# Patient Record
Sex: Female | Born: 1980 | Race: Black or African American | Hispanic: No | Marital: Married | Smoking: Never smoker
Health system: Southern US, Community
[De-identification: ages and names within clinical notes are randomized; demographics above are authoritative.]

## PROBLEM LIST (undated history)

## (undated) DIAGNOSIS — Z9889 Other specified postprocedural states: Secondary | ICD-10-CM

## (undated) DIAGNOSIS — E039 Hypothyroidism, unspecified: Secondary | ICD-10-CM

## (undated) DIAGNOSIS — D649 Anemia, unspecified: Secondary | ICD-10-CM

## (undated) DIAGNOSIS — D219 Benign neoplasm of connective and other soft tissue, unspecified: Secondary | ICD-10-CM

## (undated) DIAGNOSIS — R51 Headache: Secondary | ICD-10-CM

## (undated) DIAGNOSIS — K429 Umbilical hernia without obstruction or gangrene: Secondary | ICD-10-CM

## (undated) DIAGNOSIS — D497 Neoplasm of unspecified behavior of endocrine glands and other parts of nervous system: Secondary | ICD-10-CM

## (undated) DIAGNOSIS — C801 Malignant (primary) neoplasm, unspecified: Secondary | ICD-10-CM

## (undated) DIAGNOSIS — R112 Nausea with vomiting, unspecified: Secondary | ICD-10-CM

## (undated) DIAGNOSIS — Q0700 Arnold-Chiari syndrome without spina bifida or hydrocephalus: Principal | ICD-10-CM

## (undated) HISTORY — DX: Malignant (primary) neoplasm, unspecified: C80.1

## (undated) HISTORY — PX: WISDOM TOOTH EXTRACTION: SHX21

## (undated) HISTORY — DX: Headache: R51

## (undated) HISTORY — PX: HERNIA REPAIR: SHX51

## (undated) HISTORY — DX: Benign neoplasm of connective and other soft tissue, unspecified: D21.9

## (undated) HISTORY — DX: Arnold-Chiari syndrome without spina bifida or hydrocephalus: Q07.00

---
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
## 2005-05-17 ENCOUNTER — Other Ambulatory Visit: Admission: RE | Admit: 2005-05-17 | Discharge: 2005-05-17 | Payer: Self-pay | Admitting: Obstetrics and Gynecology

## 2009-03-19 ENCOUNTER — Encounter (INDEPENDENT_AMBULATORY_CARE_PROVIDER_SITE_OTHER): Payer: Self-pay | Admitting: Obstetrics and Gynecology

## 2009-03-19 ENCOUNTER — Ambulatory Visit (HOSPITAL_COMMUNITY): Admission: RE | Admit: 2009-03-19 | Discharge: 2009-03-19 | Payer: Self-pay | Admitting: Obstetrics and Gynecology

## 2009-03-19 HISTORY — PX: DILATION AND CURETTAGE OF UTERUS: SHX78

## 2010-06-29 ENCOUNTER — Inpatient Hospital Stay (HOSPITAL_COMMUNITY)
Admission: AD | Admit: 2010-06-29 | Discharge: 2010-06-29 | Payer: Self-pay | Source: Home / Self Care | Attending: Obstetrics and Gynecology | Admitting: Obstetrics and Gynecology

## 2010-07-04 LAB — URINE MICROSCOPIC-ADD ON

## 2010-07-04 LAB — URINALYSIS, ROUTINE W REFLEX MICROSCOPIC
Bilirubin Urine: NEGATIVE
Ketones, ur: NEGATIVE mg/dL
Nitrite: NEGATIVE
Protein, ur: NEGATIVE mg/dL
Specific Gravity, Urine: 1.01 (ref 1.005–1.030)
Urine Glucose, Fasting: NEGATIVE mg/dL
Urobilinogen, UA: 0.2 mg/dL (ref 0.0–1.0)
pH: 6.5 (ref 5.0–8.0)

## 2010-07-04 LAB — WET PREP, GENITAL
Clue Cells Wet Prep HPF POC: NONE SEEN
Trich, Wet Prep: NONE SEEN
Yeast Wet Prep HPF POC: NONE SEEN

## 2010-07-04 LAB — URINE CULTURE
Colony Count: NO GROWTH
Culture  Setup Time: 201201111119
Culture: NO GROWTH

## 2010-07-04 LAB — GC/CHLAMYDIA PROBE AMP, GENITAL
Chlamydia, DNA Probe: NEGATIVE
GC Probe Amp, Genital: NEGATIVE

## 2010-09-22 LAB — CBC
HCT: 36.4 % (ref 36.0–46.0)
Hemoglobin: 12.2 g/dL (ref 12.0–15.0)
MCHC: 33.5 g/dL (ref 30.0–36.0)
MCV: 93.9 fL (ref 78.0–100.0)
Platelets: 298 10*3/uL (ref 150–400)
RBC: 3.88 MIL/uL (ref 3.87–5.11)
RDW: 15.5 % (ref 11.5–15.5)
WBC: 6.8 10*3/uL (ref 4.0–10.5)

## 2010-10-05 ENCOUNTER — Inpatient Hospital Stay (HOSPITAL_COMMUNITY)
Admission: AD | Admit: 2010-10-05 | Discharge: 2010-10-08 | DRG: 373 | Disposition: A | Discharge status: Home / Self Care | Payer: BC Managed Care – PPO | Source: Ambulatory Visit | Attending: Obstetrics and Gynecology | Admitting: Obstetrics and Gynecology

## 2010-10-05 DIAGNOSIS — D649 Anemia, unspecified: Secondary | ICD-10-CM | POA: Diagnosis present

## 2010-10-05 DIAGNOSIS — Z22330 Carrier of Group B streptococcus: Secondary | ICD-10-CM

## 2010-10-05 DIAGNOSIS — O99892 Other specified diseases and conditions complicating childbirth: Secondary | ICD-10-CM | POA: Diagnosis present

## 2010-10-05 DIAGNOSIS — O9902 Anemia complicating childbirth: Secondary | ICD-10-CM | POA: Diagnosis present

## 2010-10-05 LAB — CBC
HCT: 33.2 % — ABNORMAL LOW (ref 36.0–46.0)
Hemoglobin: 10.8 g/dL — ABNORMAL LOW (ref 12.0–15.0)
MCH: 32.4 pg (ref 26.0–34.0)
MCHC: 32.5 g/dL (ref 30.0–36.0)
MCV: 99.7 fL (ref 78.0–100.0)
Platelets: 269 10*3/uL (ref 150–400)
RBC: 3.33 MIL/uL — ABNORMAL LOW (ref 3.87–5.11)
RDW: 13.4 % (ref 11.5–15.5)
WBC: 12.5 10*3/uL — ABNORMAL HIGH (ref 4.0–10.5)

## 2010-10-06 LAB — RPR: RPR Ser Ql: NONREACTIVE

## 2010-10-07 LAB — CBC
HCT: 26.3 % — ABNORMAL LOW (ref 36.0–46.0)
Hemoglobin: 8.9 g/dL — ABNORMAL LOW (ref 12.0–15.0)
MCH: 33.8 pg (ref 26.0–34.0)
MCHC: 33.8 g/dL (ref 30.0–36.0)
MCV: 100 fL (ref 78.0–100.0)
Platelets: 184 10*3/uL (ref 150–400)
RBC: 2.63 MIL/uL — ABNORMAL LOW (ref 3.87–5.11)
RDW: 13.3 % (ref 11.5–15.5)
WBC: 11.3 10*3/uL — ABNORMAL HIGH (ref 4.0–10.5)

## 2010-10-27 NOTE — H&P (Signed)
  NAMETRESA, JOLLEY           ACCOUNT NO.:  [id number]  MEDICAL RECORD NO.:  [id number]           PATIENT TYPE:  LOCATION:  169                            FACILITY:  PHYSICIAN:  Janine Limbo, M.D.DATE OF BIRTH:  02-11-1981  DATE OF ADMISSION: DATE OF DISCHARGE:                             HISTORY & PHYSICAL   HISTORY OF PRESENT ILLNESS:  Ms. Schlee is a 30 year old female, gravida 2, para 0-0-1-0, who presents for induction of labor.  The patient has been followed at the [facility] of [facility].  The patient's pregnancy has been complicated by asthma, and anemia.  She has used an inhaler as needed.  She is currently taking iron.  She has done well.  OBSTETRICAL HISTORY:  The patient has had one first trimester miscarriage.  DRUG ALLERGIES:  NASONEX and SINGULAIR.  SOCIAL HISTORY:  The patient denies cigarette use, alcohol use, and recreational drug use.  PAST MEDICAL HISTORY:  The patient had her wisdom teeth removed in 1999. She had a polyp removed in October 2010.  She had a dilatation and evacuation for miscarriage in 2010.  The patient has a past history of asthma and anemia as mentioned above.  REVIEW OF SYSTEMS:  Normal pregnancy complaints.  FAMILY HISTORY:  Noncontributory.  PHYSICAL EXAMINATION:  VITAL SIGNS:  Weight is 208 pounds.  Height is 5 feet 3 inches. HEENT:  Within normal limits. CHEST:  Clear. HEART:  Regular rate and rhythm. BREASTS:  Without masses. ABDOMEN:  Gravid with fundal height of 38 cm. EXTREMITIES:  Show 2-3+ edema. NEUROLOGIC:  Grossly normal. PELVIC:  The cervix is 3 cm dilated, 75% effaced and -2 station.  LABORATORY VALUES:  Blood type is O+, antibody screen negative, sickle cell negative, VDRL nonreactive, rubella immune, HBSAG negative, HIV is nonreactive.  First trimester screen is within normal limits.  Third trimester beta strep is  positive.  ASSESSMENT: 1. A 39-week and 4-day gestation. 2. Desire for induction of labor. 3. Asthma. 4. Positive beta strep test.  PLAN:  The patient will be admitted for induction of labor.  She understands that she may be at increased risk for cesarean delivery as a result of her induction.  She accepts the above risks and has requested to proceed as outlined above.  The patient will receive prophylactic antibiotics because of her positive beta strep culture.     Janine Limbo, M.D.     AVS/MEDQ  D:  10/05/2010  T:  10/05/2010  Job:  [id number]  Electronically Signed by Kirkland Hun M.D. on 10/27/2010 03:19:41 AM

## 2010-10-27 NOTE — Op Note (Signed)
Morgan Fleming, BIGLER           ACCOUNT NO.:  [id number]  MEDICAL RECORD NO.:  [id number]           PATIENT TYPE:  I  LOCATION:  9115                          FACILITY:  [facility]  PHYSICIAN:  Janine Limbo, M.D.DATE OF BIRTH:  1980/07/28  DATE OF PROCEDURE:  10/06/2010 DATE OF DISCHARGE:                              OPERATIVE REPORT   PREOPERATIVE DIAGNOSES: 1. 39-week and 4-day gestation. 2. Second stage fetal heart rate decelerations.  POSTOPERATIVE DIAGNOSES: 1. 39-week and 4-day gestation. 2. Second stage fetal heart rate decelerations. 3. Short umbilical cord. 4. Second-degree midline laceration.  PROCEDURES: 1. Vacuum extraction vaginal delivery. 2. Repair of second-degree midline laceration.  OBSTETRICIAN:  Janine Limbo, MD  FIRST ASSISTANT:  None.  ANESTHETIC:  Epidural.  DISPOSITION:  Morgan Fleming is a 30 year old female, gravida 2, para 0-0-1- 0, who was admitted on October 05, 2010, for induction of labor.  The patient has been followed at the [facility].  The patient has a history of asthma, a history of anemia, and a history of an elevated body mass index.  The patient was started on Pitocin and was noted to have decelerations throughout the night.  Her membranes were ruptured at approximately 9 o'clock a.m. on October 06, 2010.  An amnioinfusion was started at approximately 11:45 a.m. because of some fetal heart rate decelerations.  The patient was noted to be completely dilated and at a +3 station at approximately 2:40 p.m.  The patient was allowed to push, but again was noted to have decelerations of the fetal heart rate tracing with slow return to the baseline.  We discussed our management options which included observation only, continued pushing, operative vaginal delivery, and cesarean delivery.  The risks and benefits of each of those options were outlined.  The patient  elected to proceed with operative vaginal delivery.  We then reviewed forceps vaginal delivery and vacuum extraction vaginal delivery.  The risk and benefits of those options were outlined.  The patient, her husband, and her sister-in-law, and her mother all agreed that vacuum extraction vaginal delivery seemed to be the best option.  The specific risks of vacuum extraction were reviewed including caput formation, hematoma formation, the rare risk of intracranial bleeding, and the risk that the vacuum extraction would be unsuccessful and that we would still need to proceed with cesarean delivery.  The patient and her family members were ready to proceed.  FINDINGS:  An 8-pound-2-ounce female infant Morgan Fleming) was delivered from an occiput anterior position.  The Apgar scores were 8 at 1 minute and 9 at 5 minutes.  The arterial cord blood pH was 7.40.  The venous cord blood pH was 7.39.  There was a second-degree midline laceration present.  There were no upper vaginal or cervical lacerations.  The placenta appeared normal except for the short umbilical cord.  The placenta was sent to Pathology.  PROCEDURE:  The patient was placed in a lithotomy position in the Labor and Delivery Suite.  The perineum was prepped and then draped.  The cervix was completely dilated and 100% effaced.  The fetal head  could be seen at the introitus without spreading the labia.  A Foley catheter had just recently been removed.  The estimated fetal weight was 7.5-8 pounds.  The patient was noted to have an adequate pelvis.  Kiwi vacuum extractor was applied and with one push the fetal head was delivered. The mouth and nose were suctioned.  The remainder of the infant was then delivered.  The cord was clamped and cut by the family members and the infant was handed to the awaiting pediatric team under the direction of Dr. Mikle Bosworth who was present.  Routine cord blood studies were obtained including arterial and  venous pH values.  The placenta was then removed. The above vagina and perineum were carefully inspected.  The laceration was closed using interrupted sutures of 2-0 Vicryl followed by a running suture of 2-0 Vicryl for the vaginal mucosa and the perineum.  The closure was noted to be very good.  Hemostasis was adequate.  The estimated blood loss was 300 mL.  The patient was then returned to the supine position.  She was allowed to remain in the room with the infant for bonding.     Janine Limbo, M.D.     AVS/MEDQ  D:  10/06/2010  T:  10/07/2010  Job:  [id number]  Electronically Signed by Kirkland Hun M.D. on 10/27/2010 03:21:35 AM

## 2011-03-13 ENCOUNTER — Encounter (INDEPENDENT_AMBULATORY_CARE_PROVIDER_SITE_OTHER): Payer: Self-pay

## 2011-03-14 ENCOUNTER — Ambulatory Visit (INDEPENDENT_AMBULATORY_CARE_PROVIDER_SITE_OTHER): Payer: BC Managed Care – PPO | Admitting: General Surgery

## 2011-03-14 ENCOUNTER — Encounter (INDEPENDENT_AMBULATORY_CARE_PROVIDER_SITE_OTHER): Payer: Self-pay | Admitting: General Surgery

## 2011-03-14 VITALS — BP 146/88 | HR 68 | Temp 96.7°F | Resp 16 | Ht 63.0 in | Wt 195.6 lb

## 2011-03-14 DIAGNOSIS — K429 Umbilical hernia without obstruction or gangrene: Secondary | ICD-10-CM | POA: Insufficient documentation

## 2011-03-14 NOTE — Progress Notes (Signed)
Chief Complaint  Patient presents with  . Other    Eval umbilical hernia    HPI Morgan Fleming is a 30 y.o. female.     HPI  The patient comes in with a symptomatic umbilical hernia that she's had since her pregnancy which she delivered approximately 5 months ago. She has a happy healthy baby boy. Since that time the umbilical hernia is larger and is more symptomatic with discomfort and protrusion  Past Medical History  Diagnosis Date  . Asthma     Past Surgical History  Procedure Date  . Dilation and curettage, diagnostic / therapeutic 2010  . Skin tag removal 1996    right ear    History reviewed. No pertinent family history.  Social History History  Substance Use Topics  . Smoking status: Never Smoker   . Smokeless tobacco: Never Used  . Alcohol Use: No    Allergies  Allergen Reactions  . Nasonex Swelling    Only lips and nose.  . Singulair Nausea And Vomiting    Current Outpatient Prescriptions  Medication Sig Dispense Refill  . Prenatal Multivit-Min-Fe-FA (PRENATAL PO) Take by mouth 2 (two) times daily.        . ALBUTEROL IN Inhale into the lungs as needed.       . Fluticasone-Salmeterol (ADVAIR HFA IN) Inhale into the lungs daily.       . Norethindrone (ORTHO MICRONOR PO) Take by mouth.          Review of Systems Review of Systems  Constitutional: Negative.   HENT: Negative.   Eyes: Negative.   Respiratory: Negative.   Cardiovascular: Negative.   Gastrointestinal: Positive for abdominal pain (periumbilical).  Genitourinary: Negative.   Musculoskeletal: Negative.   Skin: Negative.   Neurological: Negative.   Hematological: Negative.   Psychiatric/Behavioral: Negative.     Blood pressure 146/88, pulse 68, temperature 96.7 F (35.9 C), temperature source Temporal, resp. rate 16, height 5\' 3"  (1.6 m), weight 195 lb 9.6 oz (88.724 kg).  Physical Exam Physical Exam  Constitutional: She is oriented to person, place, and time. She appears  well-developed and well-nourished.  HENT:  Head: Normocephalic and atraumatic.  Eyes: EOM are normal. Pupils are equal, round, and reactive to light.  Neck: Normal range of motion. Neck supple.  Cardiovascular: Normal rate, regular rhythm, normal heart sounds and intact distal pulses.   Pulmonary/Chest: Effort normal and breath sounds normal.  Abdominal: Soft. Normal appearance and bowel sounds are normal. There is tenderness (periumbilical). A hernia (umbilical + diastasis recti) is present.    Musculoskeletal: Normal range of motion.  Neurological: She is alert and oriented to person, place, and time.  Skin: Skin is warm and dry.  Psychiatric: She has a normal mood and affect. Her behavior is normal. Judgment and thought content normal.    Data Reviewed I reviewed the notes from Dr. Debria Garret office and there are no significant studies to review  Assessment    1.  Umbilical hernia non-incarcerated; 2.  Diastasis recti; 3.  Mild hypertension    Plan    Umbilical hernia repair discussed in detail with patient including risks and benefits. diastasi recti also discussed in terms of not being altered by planned operation       Morgan Fleming,Malique Driskill O 03/14/2011, 9:26 AM

## 2011-05-23 ENCOUNTER — Encounter (HOSPITAL_BASED_OUTPATIENT_CLINIC_OR_DEPARTMENT_OTHER): Payer: Self-pay | Admitting: *Deleted

## 2011-05-28 NOTE — H&P (Signed)
HISTORY AND PHYSICAL EXAMINATION     Author Note Status Last Update User Last Update Date/Time    Jetty Duhamel, MD Signed Jetty Duhamel, MD 03/14/2011  9:45 AM       Chief Complaint: ABDOMINAL PAIN AND UMBILICAL HERNIA Patient presents with  An umbilical hernia   HPI Morgan Fleming is a 30 y.Fleming. Female. The patient comes in with a symptomatic umbilical hernia that she's had since her pregnancy which she delivered approximately 5 months ago. She has a happy healthy baby boy. Since that time the umbilical hernia is larger and is more symptomatic with discomfort and protrusion    Past Medical History   Diagnosis  Date   .  Asthma           Past Surgical History   Procedure  Date   .  Dilation and curettage, diagnostic / therapeutic  2010   .  Skin tag removal  1996       right ear    History reviewed. No pertinent family history.  Social History History   Substance Use Topics   .  Smoking status:  Never Smoker    .  Smokeless tobacco:  Never Used   .  Alcohol Use:  No      Allergies   Allergen  Reactions   .  Nasonex  Swelling       Only lips and nose.   .  Singulair  Nausea And Vomiting      Current Outpatient Prescriptions   Medication  Sig  Dispense  Refill   .  Prenatal Multivit-Min-Fe-FA (PRENATAL PO)  Take by mouth 2 (two) times daily.           .  ALBUTEROL IN  Inhale into the lungs as needed.          .  Fluticasone-Salmeterol (ADVAIR HFA IN)  Inhale into the lungs daily.          .  Norethindrone (ORTHO MICRONOR PO)  Take by mouth.            Review of Systems  Constitutional: Negative.   HENT: Negative.   Eyes: Negative.   Respiratory: Negative.   Cardiovascular: Negative.   Gastrointestinal: Positive for abdominal pain (periumbilical).  Genitourinary: Negative.   Musculoskeletal: Negative.   Skin: Negative.   Neurological: Negative.   Hematological: Negative.   Psychiatric/Behavioral: Negative.    Blood pressure 146/88, pulse 68,  temperature 96.7 F (35.9 C), temperature source Temporal, resp. rate 16, height 5\' 3"  (1.6 m), weight 195 lb 9.6 oz (88.724 kg).  Physical Exam   Constitutional: She is oriented to person, place, and time. She appears well-developed and well-nourished.  HENT:   Head: Normocephalic and atraumatic.  Eyes: EOM are normal. Pupils are equal, round, and reactive to light.  Neck: Normal range of motion. Neck supple.  Cardiovascular: Normal rate, regular rhythm, normal heart sounds and intact distal pulses.   Pulmonary/Chest: Effort normal and breath sounds normal.  Abdominal: Soft. Normal appearance and bowel sounds are normal. There is tenderness (periumbilical). A hernia (umbilical + diastasis recti) is present. The unbilicalhernia is just below the umbilicus with a large diastasis recti above the umbilicus Musculoskeletal: Normal range of motion.  Neurological: She is alert and oriented to person, place, and time.  Skin: Skin is warm and dry.  Psychiatric: She has a normal mood and affect. Her behavior is normal. Judgment and thought content normal.      Data  Reviewed I reviewed the notes from Dr. Debria Garret office and there are no significant studies to review   Assessment    1.  Umbilical hernia non-incarcerated; 2.  Diastasis recti; 3.  Mild hypertension  Plan    Umbilical hernia repair discussed in detail with patient including risks and benefits. diastasi recti also discussed in terms of not being altered by planned operation    Morgan Fleming,Morgan Fleming 03/14/2011, 9:26 AM  This H&P was drafted from the original progress note from the office visit originally.

## 2011-05-29 ENCOUNTER — Ambulatory Visit (HOSPITAL_BASED_OUTPATIENT_CLINIC_OR_DEPARTMENT_OTHER)
Admission: RE | Admit: 2011-05-29 | Discharge: 2011-05-29 | Disposition: A | Discharge status: Home / Self Care | Payer: BC Managed Care – PPO | Source: Ambulatory Visit | Attending: General Surgery | Admitting: General Surgery

## 2011-05-29 ENCOUNTER — Encounter (HOSPITAL_BASED_OUTPATIENT_CLINIC_OR_DEPARTMENT_OTHER): Payer: Self-pay | Admitting: Anesthesiology

## 2011-05-29 ENCOUNTER — Encounter (HOSPITAL_BASED_OUTPATIENT_CLINIC_OR_DEPARTMENT_OTHER): Payer: Self-pay | Admitting: *Deleted

## 2011-05-29 ENCOUNTER — Encounter (HOSPITAL_BASED_OUTPATIENT_CLINIC_OR_DEPARTMENT_OTHER)
Admission: RE | Disposition: A | Discharge status: Home / Self Care | Payer: Self-pay | Source: Ambulatory Visit | Attending: General Surgery

## 2011-05-29 ENCOUNTER — Ambulatory Visit (HOSPITAL_BASED_OUTPATIENT_CLINIC_OR_DEPARTMENT_OTHER): Payer: BC Managed Care – PPO | Admitting: Anesthesiology

## 2011-05-29 DIAGNOSIS — I10 Essential (primary) hypertension: Secondary | ICD-10-CM | POA: Insufficient documentation

## 2011-05-29 DIAGNOSIS — K429 Umbilical hernia without obstruction or gangrene: Secondary | ICD-10-CM

## 2011-05-29 DIAGNOSIS — Z01812 Encounter for preprocedural laboratory examination: Secondary | ICD-10-CM | POA: Insufficient documentation

## 2011-05-29 DIAGNOSIS — J45909 Unspecified asthma, uncomplicated: Secondary | ICD-10-CM | POA: Insufficient documentation

## 2011-05-29 HISTORY — DX: Anemia, unspecified: D64.9

## 2011-05-29 HISTORY — DX: Umbilical hernia without obstruction or gangrene: K42.9

## 2011-05-29 HISTORY — PX: UMBILICAL HERNIA REPAIR: SHX196

## 2011-05-29 LAB — POCT HEMOGLOBIN-HEMACUE: Hemoglobin: 10.8 g/dL — ABNORMAL LOW (ref 12.0–15.0)

## 2011-05-29 SURGERY — REPAIR, HERNIA, UMBILICAL, ADULT
Anesthesia: General | Site: Abdomen | Wound class: Clean

## 2011-05-29 MED ORDER — CEFAZOLIN SODIUM 1-5 GM-% IV SOLN: 1.0000 g | INTRAVENOUS | Status: DC

## 2011-05-29 MED ORDER — BUPIVACAINE HCL (PF) 0.5 % IJ SOLN
INTRAMUSCULAR | Status: DC | PRN
  Administered 2011-05-29: 10 mL

## 2011-05-29 MED ORDER — SODIUM CHLORIDE 0.9 % IR SOLN
Status: DC | PRN
  Administered 2011-05-29: 08:00:00

## 2011-05-29 MED ORDER — HYDROCODONE-ACETAMINOPHEN 5-500 MG PO TABS: 1.0000 | ORAL_TABLET | Freq: Four times a day (QID) | ORAL | Status: AC | PRN

## 2011-05-29 MED ORDER — ONDANSETRON HCL 4 MG/2ML IJ SOLN
INTRAMUSCULAR | Status: DC | PRN
  Administered 2011-05-29: 4 mg via INTRAVENOUS

## 2011-05-29 MED ORDER — ACETAMINOPHEN 10 MG/ML IV SOLN
1000.0000 mg | Freq: Once | INTRAVENOUS | Status: AC
  Administered 2011-05-29: 1000 mg via INTRAVENOUS

## 2011-05-29 MED ORDER — FENTANYL CITRATE 0.05 MG/ML IJ SOLN
25.0000 ug | INTRAMUSCULAR | Status: DC | PRN
  Administered 2011-05-29 (×2): 25 ug via INTRAVENOUS

## 2011-05-29 MED ORDER — OXYCODONE HCL 5 MG PO TABS
5.0000 mg | ORAL_TABLET | Freq: Once | ORAL | Status: AC
  Administered 2011-05-29: 5 mg via ORAL

## 2011-05-29 MED ORDER — LIDOCAINE HCL (CARDIAC) 20 MG/ML IV SOLN
INTRAVENOUS | Status: DC | PRN
  Administered 2011-05-29: 50 mg via INTRAVENOUS

## 2011-05-29 MED ORDER — METOCLOPRAMIDE HCL 5 MG/ML IJ SOLN
INTRAMUSCULAR | Status: DC | PRN
  Administered 2011-05-29: 10 mg via INTRAVENOUS

## 2011-05-29 MED ORDER — MORPHINE SULFATE 2 MG/ML IJ SOLN: 0.0500 mg/kg | INTRAMUSCULAR | Status: DC | PRN

## 2011-05-29 MED ORDER — METOCLOPRAMIDE HCL 5 MG/ML IJ SOLN: 10.0000 mg | Freq: Once | INTRAMUSCULAR | Status: DC | PRN

## 2011-05-29 MED ORDER — PROPOFOL 10 MG/ML IV EMUL
INTRAVENOUS | Status: DC | PRN
  Administered 2011-05-29: 50 mg via INTRAVENOUS
  Administered 2011-05-29: 200 mg via INTRAVENOUS

## 2011-05-29 MED ORDER — MIDAZOLAM HCL 5 MG/5ML IJ SOLN
INTRAMUSCULAR | Status: DC | PRN
  Administered 2011-05-29: 2 mg via INTRAVENOUS

## 2011-05-29 MED ORDER — DEXAMETHASONE SODIUM PHOSPHATE 4 MG/ML IJ SOLN
INTRAMUSCULAR | Status: DC | PRN
Start: 2011-05-29 — End: 2011-05-29
  Administered 2011-05-29: 10 mg via INTRAVENOUS

## 2011-05-29 MED ORDER — CEFAZOLIN SODIUM-DEXTROSE 2-3 GM-% IV SOLR
2.0000 g | INTRAVENOUS | Status: AC
  Administered 2011-05-29: 2 g via INTRAVENOUS

## 2011-05-29 MED ORDER — MIDAZOLAM HCL 2 MG/2ML IJ SOLN: 0.5000 mg | INTRAMUSCULAR | Status: DC | PRN

## 2011-05-29 MED ORDER — LACTATED RINGERS IV SOLN
INTRAVENOUS | Status: DC | PRN
  Administered 2011-05-29: 07:00:00 via INTRAVENOUS

## 2011-05-29 MED ORDER — FENTANYL CITRATE 0.05 MG/ML IJ SOLN
INTRAMUSCULAR | Status: DC | PRN
  Administered 2011-05-29: 25 ug via INTRAVENOUS
  Administered 2011-05-29: 50 ug via INTRAVENOUS
  Administered 2011-05-29: 100 ug via INTRAVENOUS
  Administered 2011-05-29: 25 ug via INTRAVENOUS

## 2011-05-29 MED ORDER — LACTATED RINGERS IV SOLN: INTRAVENOUS | Status: DC

## 2011-05-29 SURGICAL SUPPLY — 67 items
BAG DECANTER FOR FLEXI CONT (MISCELLANEOUS) ×2 IMPLANT
BINDER ABD UNIV 10 28-50 (GAUZE/BANDAGES/DRESSINGS) IMPLANT
BINDER ABD UNIV 12 30-45 (MISCELLANEOUS) ×1 IMPLANT
BINDER ABDOM UNIV 10 (GAUZE/BANDAGES/DRESSINGS)
BINDER ABDOMINAL 12 (MISCELLANEOUS) ×2
BLADE SURG 10 STRL SS (BLADE) ×2 IMPLANT
BLADE SURG 15 STRL LF DISP TIS (BLADE) ×1 IMPLANT
BLADE SURG 15 STRL SS (BLADE) ×1
BLADE SURG ROTATE 9660 (MISCELLANEOUS) IMPLANT
CANISTER SUCTION 1200CC (MISCELLANEOUS) IMPLANT
CHLORAPREP W/TINT 26ML (MISCELLANEOUS) ×2 IMPLANT
CLEANER CAUTERY TIP 5X5 PAD (MISCELLANEOUS) ×1 IMPLANT
CLOTH BEACON ORANGE TIMEOUT ST (SAFETY) ×2 IMPLANT
CONT SPEC 4OZ CLIKSEAL STRL BL (MISCELLANEOUS) IMPLANT
COVER MAYO STAND STRL (DRAPES) ×2 IMPLANT
COVER TABLE BACK 60X90 (DRAPES) ×2 IMPLANT
DECANTER SPIKE VIAL GLASS SM (MISCELLANEOUS) IMPLANT
DERMABOND ADVANCED (GAUZE/BANDAGES/DRESSINGS) ×1
DERMABOND ADVANCED .7 DNX12 (GAUZE/BANDAGES/DRESSINGS) ×1 IMPLANT
DRAPE LAPAROTOMY TRNSV 102X78 (DRAPE) ×2 IMPLANT
DRAPE UTILITY XL STRL (DRAPES) ×2 IMPLANT
DRSG TEGADERM 2-3/8X2-3/4 SM (GAUZE/BANDAGES/DRESSINGS) IMPLANT
DRSG TEGADERM 4X4.75 (GAUZE/BANDAGES/DRESSINGS) ×2 IMPLANT
ELECT REM PT RETURN 9FT ADLT (ELECTROSURGICAL) ×2
ELECTRODE REM PT RTRN 9FT ADLT (ELECTROSURGICAL) ×1 IMPLANT
GLOVE BIO SURGEON STRL SZ 6 (GLOVE) ×2 IMPLANT
GLOVE BIOGEL PI IND STRL 6.5 (GLOVE) ×1 IMPLANT
GLOVE BIOGEL PI IND STRL 8 (GLOVE) ×1 IMPLANT
GLOVE BIOGEL PI INDICATOR 6.5 (GLOVE) ×1
GLOVE BIOGEL PI INDICATOR 8 (GLOVE) ×1
GLOVE ECLIPSE 7.5 STRL STRAW (GLOVE) ×2 IMPLANT
GOWN PREVENTION PLUS XLARGE (GOWN DISPOSABLE) ×4 IMPLANT
NEEDLE HYPO 25X1 1.5 SAFETY (NEEDLE) ×2 IMPLANT
NS IRRIG 1000ML POUR BTL (IV SOLUTION) IMPLANT
PACK BASIN DAY SURGERY FS (CUSTOM PROCEDURE TRAY) ×2 IMPLANT
PAD CLEANER CAUTERY TIP 5X5 (MISCELLANEOUS) ×1
PATCH VENTRAL SMALL 4.3 (Mesh Specialty) ×2 IMPLANT
PENCIL BUTTON HOLSTER BLD 10FT (ELECTRODE) ×2 IMPLANT
SLEEVE SCD COMPRESS KNEE MED (MISCELLANEOUS) ×2 IMPLANT
SPONGE INTESTINAL PEANUT (DISPOSABLE) IMPLANT
SPONGE LAP 4X18 X RAY DECT (DISPOSABLE) ×2 IMPLANT
STAPLER VISISTAT 35W (STAPLE) IMPLANT
STRIP CLOSURE SKIN 1/2X4 (GAUZE/BANDAGES/DRESSINGS) ×2 IMPLANT
SUT ETHIBOND 0 MO6 C/R (SUTURE) IMPLANT
SUT MNCRL AB 4-0 PS2 18 (SUTURE) ×2 IMPLANT
SUT NOVA NAB DX-16 0-1 5-0 T12 (SUTURE) IMPLANT
SUT NOVA NAB GS-21 0 18 T12 DT (SUTURE) ×4 IMPLANT
SUT PROLENE 0 SH 30 (SUTURE) IMPLANT
SUT PROLENE 1 CT (SUTURE) IMPLANT
SUT VIC AB 3-0 FS2 27 (SUTURE) IMPLANT
SUT VIC AB 3-0 SH 27 (SUTURE) ×1
SUT VIC AB 3-0 SH 27X BRD (SUTURE) ×1 IMPLANT
SUT VIC AB 4-0 RB1 27 (SUTURE)
SUT VIC AB 4-0 RB1 27X BRD (SUTURE) IMPLANT
SUT VIC AB 4-0 SH 27 (SUTURE) ×1
SUT VIC AB 4-0 SH 27XANBCTRL (SUTURE) ×1 IMPLANT
SUT VIC AB 5-0 PS2 18 (SUTURE) IMPLANT
SUT VICRYL 4-0 PS2 18IN ABS (SUTURE) IMPLANT
SUT VICRYL AB 2 0 TIE (SUTURE) IMPLANT
SUT VICRYL AB 2 0 TIES (SUTURE)
SYR BULB 3OZ (MISCELLANEOUS) IMPLANT
SYR CONTROL 10ML LL (SYRINGE) ×2 IMPLANT
TOWEL OR 17X24 6PK STRL BLUE (TOWEL DISPOSABLE) ×2 IMPLANT
TOWEL OR NON WOVEN STRL DISP B (DISPOSABLE) ×2 IMPLANT
TUBE CONNECTING 20X1/4 (TUBING) IMPLANT
WATER STERILE IRR 1000ML POUR (IV SOLUTION) IMPLANT
YANKAUER SUCT BULB TIP NO VENT (SUCTIONS) IMPLANT

## 2011-05-29 NOTE — Op Note (Signed)
OPERATIVE REPORT  DATE OF OPERATION: 05/29/2011  PATIENT:  Morgan Fleming  30 y.o. female  PRE-OPERATIVE DIAGNOSIS:  umbilical hernia  POST-OPERATIVE DIAGNOSIS:  umbilical hernia  PROCEDURE:  Procedure(s): HERNIA REPAIR UMBILICAL ADULT  SURGEON:  Surgeon(s): Cherylynn Ridges III, MD  ASSISTANT: None  ANESTHESIA:   General with LMA  EBL: <25 ml  BLOOD ADMINISTERED: none  DRAINS: none   SPECIMEN:  No Specimen  COUNTS CORRECT:  YES  PROCEDURE DETAILS: The patient was taken to the operating room and placed on the table in the supine position. After an adequate general laryngeal airway anesthetic was completed the patient was prepped and draped in usual sterile manner exposing the entire periumbilical area.  We marked the area of of the incision to be made in and that time out identifying the patient and the procedure to be performed. We used a 15 blade to make a supraumbilical incision approximately 3-1/2-4 cm long and taken down to subcutaneous tissue. The hernia sac was immediately identified we circumferentially isolated the sac from the fascial edge. We size along the edge of the fascia and the hernia sac using electrocautery. Once we had the edges of the fascia completely identified we used cocoa clamp slipped up on an and dissected away the internal contents away from the anterior abdominal wall.  The complete size and the hernia defect was about 4 cm. We used a 4.3 cm circular proceed patch mesh in order to complete hernia repair. We implanted an underneath the fascia and tacked to the anterior abdominal wall using interrupted 0 Novafil sutures. We then reapproximated the fascial edges using 0 Novafil sutures. We irrigated the wound with antibiotic solution and brought the subcutaneous tissue back on top of the fascial plane using interrupted 3-0 Vicryl sutures.  Once this was done we brought the subcuticular was together using interrupted 3-0 Vicryl suture. We injected half  percent Marcaine without epi into the wound and closed the skin using running subcuticular stitch of 4-0 Monocryl. Dermabond Steri-Strips and Tegaderms using complete the dressing abdominal binder was placed on top all needle counts sponge counts and instrument counts were correct.  PATIENT DISPOSITION:  PACU - hemodynamically stable.   Jarod Bozzo III,[name] O 12/10/20128:49 AM

## 2011-05-29 NOTE — Transfer of Care (Signed)
Immediate Anesthesia Transfer of Care Note  Patient: Morgan Fleming  Procedure(s) Performed:  HERNIA REPAIR UMBILICAL ADULT - Umbilical Hernia repair with mesh patch  Patient Location: PACU  Anesthesia Type: General  Level of Consciousness: awake, alert  and oriented  Airway & Oxygen Therapy: Patient Spontanous Breathing and Patient connected to face mask oxygen  Post-op Assessment: Report given to PACU RN and Post -op Vital signs reviewed and stable  Post vital signs: Reviewed and stable  Complications: No apparent anesthesia complications

## 2011-05-29 NOTE — Anesthesia Preprocedure Evaluation (Signed)
Anesthesia Evaluation  Patient identified by MRN, date of birth, ID band Patient awake    Reviewed: Allergy & Precautions, H&P , NPO status , Patient's Chart, lab work & pertinent test results, reviewed documented beta blocker date and time   Airway Mallampati: II TM Distance: >3 FB Neck ROM: full    Dental   Pulmonary asthma ,          Cardiovascular hypertension,     Neuro/Psych Negative Neurological ROS  Negative Psych ROS   GI/Hepatic negative GI ROS, Neg liver ROS,   Endo/Other  Negative Endocrine ROS  Renal/GU negative Renal ROS  Genitourinary negative   Musculoskeletal   Abdominal   Peds  Hematology negative hematology ROS (+)   Anesthesia Other Findings See surgeon's H&P   Reproductive/Obstetrics negative OB ROS                           Anesthesia Physical Anesthesia Plan  ASA: II  Anesthesia Plan: General   Post-op Pain Management:    Induction: Intravenous  Airway Management Planned: LMA  Additional Equipment:   Intra-op Plan:   Post-operative Plan: Extubation in OR  Informed Consent: I have reviewed the patients History and Physical, chart, labs and discussed the procedure including the risks, benefits and alternatives for the proposed anesthesia with the patient or authorized representative who has indicated his/her understanding and acceptance.     Plan Discussed with: CRNA and Surgeon  Anesthesia Plan Comments:         Anesthesia Quick Evaluation

## 2011-05-29 NOTE — Anesthesia Postprocedure Evaluation (Signed)
  Anesthesia Post Note  Patient: Morgan Fleming  Procedure(s) Performed:  HERNIA REPAIR UMBILICAL ADULT - Umbilical Hernia repair with mesh patch  Anesthesia type: General  Patient location: PACU  Post pain: Pain level controlled  Post assessment: Patient's Cardiovascular Status Stable  Last Vitals:  Filed Vitals:   05/29/11 1019  BP: 130/87  Pulse: 75  Temp: 36.7 C  Resp: 16    Post vital signs: Reviewed and stable  Level of consciousness: alert  Complications: No apparent anesthesia complications

## 2011-05-29 NOTE — Interval H&P Note (Signed)
History and Physical Interval Note:  05/29/2011 7:11 AM  Morgan Fleming  has presented today for surgery, with the diagnosis of umbilical hernia  The various methods of treatment have been discussed with the patient and family. After consideration of risks, benefits and other options for treatment, the patient has consented to  Procedure(s): HERNIA REPAIR UMBILICAL ADULT as a surgical intervention .  The patients' history has been reviewed, patient examined, no change in status, stable for surgery.  I have reviewed the patients' chart and labs.  Questions were answered to the patient's satisfaction.     [name],[name]  The hernia appears to be more supraumbilical upon further evaluation , but she still has a significant diastasis recti.  [name]. [name], MD, FACS [phone] [facility]

## 2011-05-29 NOTE — Anesthesia Procedure Notes (Signed)
Procedure Name: LMA Insertion Performed by: Sharyne Richters Pre-anesthesia Checklist: Patient identified, Timeout performed, Emergency Drugs available, Suction available and Patient being monitored Patient Re-evaluated:Patient Re-evaluated prior to inductionOxygen Delivery Method: Circle System Utilized Preoxygenation: Pre-oxygenation with 100% oxygen Intubation Type: IV induction Ventilation: Mask ventilation without difficulty LMA: LMA with gastric port inserted LMA Size: 4.0 Number of attempts: 1 Placement Confirmation: breath sounds checked- equal and bilateral and positive ETCO2 Tube secured with: Tape Dental Injury: Teeth and Oropharynx as per pre-operative assessment

## 2011-06-01 ENCOUNTER — Encounter (HOSPITAL_BASED_OUTPATIENT_CLINIC_OR_DEPARTMENT_OTHER): Payer: Self-pay | Admitting: General Surgery

## 2011-06-22 ENCOUNTER — Encounter (INDEPENDENT_AMBULATORY_CARE_PROVIDER_SITE_OTHER): Payer: Self-pay | Admitting: General Surgery

## 2011-06-22 ENCOUNTER — Ambulatory Visit (INDEPENDENT_AMBULATORY_CARE_PROVIDER_SITE_OTHER): Payer: BC Managed Care – PPO | Admitting: General Surgery

## 2011-06-22 VITALS — BP 130/84 | Ht 63.0 in | Wt 200.0 lb

## 2011-06-22 DIAGNOSIS — Z09 Encounter for follow-up examination after completed treatment for conditions other than malignant neoplasm: Secondary | ICD-10-CM

## 2011-06-22 NOTE — Progress Notes (Signed)
HPI The patient is status post umbilical hernia repair. By her report she is doing well. She is eating well.  PE On examination her hernia has not recurred. She has some supraumbilical firmness which likely represents scar tissue and perhaps a small hematoma.  Studiy review Nontender  Assessment Doing well status post umbilical hernia repair with circular Proceed patch  Plan I will see the patient on a p.r.n. basis. I advised her that the firmness above her umbilicus should resolve in the next 3-6 weeks.

## 2011-07-04 ENCOUNTER — Encounter (INDEPENDENT_AMBULATORY_CARE_PROVIDER_SITE_OTHER): Payer: BC Managed Care – PPO | Admitting: General Surgery

## 2012-03-04 ENCOUNTER — Encounter: Payer: Self-pay | Admitting: Obstetrics and Gynecology

## 2012-03-04 ENCOUNTER — Ambulatory Visit (INDEPENDENT_AMBULATORY_CARE_PROVIDER_SITE_OTHER): Payer: BC Managed Care – PPO | Admitting: Obstetrics and Gynecology

## 2012-03-04 VITALS — BP 132/78 | Ht 63.5 in | Wt 172.0 lb

## 2012-03-04 DIAGNOSIS — Z01419 Encounter for gynecological examination (general) (routine) without abnormal findings: Secondary | ICD-10-CM

## 2012-03-04 DIAGNOSIS — Z309 Encounter for contraceptive management, unspecified: Secondary | ICD-10-CM

## 2012-03-04 DIAGNOSIS — IMO0001 Reserved for inherently not codable concepts without codable children: Secondary | ICD-10-CM

## 2012-03-04 DIAGNOSIS — Z124 Encounter for screening for malignant neoplasm of cervix: Secondary | ICD-10-CM

## 2012-03-04 MED ORDER — ETONOGESTREL-ETHINYL ESTRADIOL 0.12-0.015 MG/24HR VA RING: VAGINAL_RING | VAGINAL | Status: DC

## 2012-03-04 NOTE — Progress Notes (Signed)
ANNUAL GYNECOLOGIC EXAMINATION   Morgan Fleming is a 31 y.o. female, G1P1, who presents for an annual exam. She is doing well.  She is not interested in getting pregnant at the moment. She is not using any birth control at the moment except coitus interruptus. She is no longer breast-feeding.  Prior Hysterectomy: No    History   Social History  . Marital Status: Married    Spouse Name: N/A    Number of Children: N/A  . Years of Education: N/A   Social History Main Topics  . Smoking status: Never Smoker   . Smokeless tobacco: Never Used  . Alcohol Use: No  . Drug Use: No  . Sexually Active: Yes    Birth Control/ Protection: None   Other Topics Concern  . None   Social History Narrative  . None    Menstrual cycle:   LMP: Patient's last menstrual period was 02/26/2012.             The following portions of the patient's history were reviewed and updated as appropriate: allergies, current medications, past family history, past medical history, past social history, past surgical history and problem list.  Review of Systems Pertinent items are noted in HPI. Breast:Negative for breast lump,nipple discharge or nipple retraction Gastrointestinal: Negative for abdominal pain, change in bowel habits or rectal bleeding Urinary:negative   Objective:    BP 132/78  Ht 5' 3.5" (1.613 m)  Wt 172 lb (78.019 kg)  BMI 29.99 kg/m2  LMP 02/26/2012    Weight:  Wt Readings from Last 1 Encounters:  03/04/12 172 lb (78.019 kg)          BMI: Body mass index is 29.99 kg/(m^2).  General Appearance: Alert, appropriate appearance for age. No acute distress HEENT: Grossly normal Neck / Thyroid: Supple, no masses, nodes or enlargement Lungs: clear to auscultation bilaterally Back: No CVA tenderness Breast Exam: No masses or nodes.No dimpling, nipple retraction or discharge. Cardiovascular: Regular rate and rhythm. S1, S2, no murmur Gastrointestinal: Soft, non-tender, no masses or  organomegaly  ++++++++++++++++++++++++++++++++++++++++++++++++++++++++  Pelvic Exam: External genitalia: normal general appearance Vaginal: normal without tenderness, induration or masses. Relaxation: Yes Cervix: normal appearance Adnexa: normal bimanual exam Uterus: normal size, shape, and consistency Rectovaginal: not indicated  ++++++++++++++++++++++++++++++++++++++++++++++++++++++++  Lymphatic Exam: Non-palpable nodes in neck, clavicular, axillary, or inguinal regions Neurologic: Normal speech, no tremor  Psychiatric: Alert and oriented, appropriate affect.   Wet Prep:   not applicable Urinalysis:  not applicable UPT:           Not done   Assessment:    Normal gyn exam   Overweight or obese: Yes   Pelvic relaxation: Yes  Contraception.   Plan:    pap smear return annually or prn Contraception:NuvaRing vaginal inserts    Medications prescribed: none  STD screen request: No   The updated Pap smear screening guidelines were discussed with the patient. The patient requested that I obtain a Pap smear: Yes.  Kegel exercises discussed: Yes.  Proper diet and regular exercise were reviewed.  Annual mammograms recommended starting at age 66. Proper breast care was discussed.  Screening colonoscopy is recommended beginning at age 2.  Regular health maintenance was reviewed.  Sleep hygiene was discussed.  Leonard Schwartz M.D.   Regular Periods: yes Mammogram: no  Monthly Breast Ex.: no Exercise: yes  Tetanus < 10 years: yes Seatbelts: yes  NI. Bladder Functn.: yes Abuse at home: no  Daily BM's: yes Stressful Work: yes  Healthy Diet: yes Sigmoid-Colonoscopy: N/A  Calcium: no Medical problems this year: NONE   LAST PAP:03/01/2011  Contraception: NONE  Mammogram: N/A  PCP: Evert Kohl  PMH: NONE  FMH: NONE  Last Bone Scan: NONE

## 2012-03-05 LAB — PAP IG W/ RFLX HPV ASCU

## 2013-10-29 LAB — OB RESULTS CONSOLE RPR: RPR: NONREACTIVE

## 2013-10-29 LAB — OB RESULTS CONSOLE RUBELLA ANTIBODY, IGM: Rubella: IMMUNE

## 2013-11-14 LAB — OB RESULTS CONSOLE HEPATITIS B SURFACE ANTIGEN: HEP B S AG: NEGATIVE

## 2014-03-31 LAB — OB RESULTS CONSOLE HIV ANTIBODY (ROUTINE TESTING): HIV: NONREACTIVE

## 2014-04-20 ENCOUNTER — Encounter: Payer: Self-pay | Admitting: Obstetrics and Gynecology

## 2014-06-29 ENCOUNTER — Encounter (HOSPITAL_COMMUNITY): Payer: Self-pay | Admitting: *Deleted

## 2014-06-29 ENCOUNTER — Encounter (HOSPITAL_COMMUNITY)
Admission: AD | Disposition: A | Discharge status: Home / Self Care | Payer: Self-pay | Source: Ambulatory Visit | Attending: Obstetrics and Gynecology

## 2014-06-29 ENCOUNTER — Inpatient Hospital Stay (HOSPITAL_COMMUNITY): Payer: BC Managed Care – PPO | Admitting: Anesthesiology

## 2014-06-29 ENCOUNTER — Inpatient Hospital Stay (HOSPITAL_COMMUNITY)
Admission: AD | Admit: 2014-06-29 | Discharge: 2014-07-02 | DRG: 766 | Disposition: A | Discharge status: Home / Self Care | Payer: BC Managed Care – PPO | Source: Ambulatory Visit | Attending: Obstetrics and Gynecology | Admitting: Obstetrics and Gynecology

## 2014-06-29 DIAGNOSIS — Z3A40 40 weeks gestation of pregnancy: Secondary | ICD-10-CM | POA: Diagnosis present

## 2014-06-29 DIAGNOSIS — D649 Anemia, unspecified: Secondary | ICD-10-CM | POA: Diagnosis present

## 2014-06-29 DIAGNOSIS — Z98891 History of uterine scar from previous surgery: Secondary | ICD-10-CM

## 2014-06-29 DIAGNOSIS — IMO0001 Reserved for inherently not codable concepts without codable children: Secondary | ICD-10-CM

## 2014-06-29 DIAGNOSIS — O9902 Anemia complicating childbirth: Secondary | ICD-10-CM | POA: Diagnosis present

## 2014-06-29 DIAGNOSIS — Z3483 Encounter for supervision of other normal pregnancy, third trimester: Secondary | ICD-10-CM | POA: Diagnosis present

## 2014-06-29 DIAGNOSIS — Z8759 Personal history of other complications of pregnancy, childbirth and the puerperium: Secondary | ICD-10-CM

## 2014-06-29 LAB — CBC
HEMATOCRIT: 34.3 % — AB (ref 36.0–46.0)
HEMOGLOBIN: 11.7 g/dL — AB (ref 12.0–15.0)
MCH: 33.7 pg (ref 26.0–34.0)
MCHC: 34.1 g/dL (ref 30.0–36.0)
MCV: 98.8 fL (ref 78.0–100.0)
Platelets: 299 10*3/uL (ref 150–400)
RBC: 3.47 MIL/uL — ABNORMAL LOW (ref 3.87–5.11)
RDW: 13.5 % (ref 11.5–15.5)
WBC: 17.7 10*3/uL — ABNORMAL HIGH (ref 4.0–10.5)

## 2014-06-29 SURGERY — Surgical Case
Anesthesia: Epidural | Site: Abdomen

## 2014-06-29 MED ORDER — MEPERIDINE HCL 25 MG/ML IJ SOLN: 6.2500 mg | INTRAMUSCULAR | Status: DC | PRN

## 2014-06-29 MED ORDER — MORPHINE SULFATE 0.5 MG/ML IJ SOLN
INTRAMUSCULAR | Status: AC
  Filled 2014-06-29: qty 10

## 2014-06-29 MED ORDER — MEPERIDINE HCL 25 MG/ML IJ SOLN
INTRAMUSCULAR | Status: AC
Start: 2014-06-29 — End: 2014-06-29
  Filled 2014-06-29: qty 1

## 2014-06-29 MED ORDER — OXYTOCIN 40 UNITS IN LACTATED RINGERS INFUSION - SIMPLE MED
62.5000 mL/h | INTRAVENOUS | Status: DC
  Filled 2014-06-29: qty 1000

## 2014-06-29 MED ORDER — OXYTOCIN BOLUS FROM INFUSION: 500.0000 mL | INTRAVENOUS | Status: DC

## 2014-06-29 MED ORDER — LIDOCAINE HCL (PF) 1 % IJ SOLN
30.0000 mL | INTRAMUSCULAR | Status: DC | PRN
  Filled 2014-06-29: qty 30

## 2014-06-29 MED ORDER — PHENYLEPHRINE HCL 10 MG/ML IJ SOLN
INTRAMUSCULAR | Status: DC | PRN
  Administered 2014-06-29 (×2): 80 ug via INTRAVENOUS

## 2014-06-29 MED ORDER — ONDANSETRON HCL 4 MG/2ML IJ SOLN: 4.0000 mg | Freq: Four times a day (QID) | INTRAMUSCULAR | Status: DC | PRN

## 2014-06-29 MED ORDER — MORPHINE SULFATE (PF) 0.5 MG/ML IJ SOLN
INTRAMUSCULAR | Status: DC | PRN
  Administered 2014-06-29: 3000 ug via EPIDURAL

## 2014-06-29 MED ORDER — KETOROLAC TROMETHAMINE 30 MG/ML IJ SOLN
30.0000 mg | Freq: Four times a day (QID) | INTRAMUSCULAR | Status: DC | PRN
Start: 2014-06-29 — End: 2014-06-30
  Administered 2014-06-30: 30 mg via INTRAMUSCULAR

## 2014-06-29 MED ORDER — LACTATED RINGERS IV SOLN
INTRAVENOUS | Status: DC
  Administered 2014-06-29 (×2): via INTRAVENOUS

## 2014-06-29 MED ORDER — EPHEDRINE 5 MG/ML INJ: 10.0000 mg | INTRAVENOUS | Status: DC | PRN

## 2014-06-29 MED ORDER — PROMETHAZINE HCL 25 MG/ML IJ SOLN
6.2500 mg | INTRAMUSCULAR | Status: DC | PRN
  Administered 2014-06-30: 12.5 mg via INTRAVENOUS

## 2014-06-29 MED ORDER — KETOROLAC TROMETHAMINE 30 MG/ML IJ SOLN
30.0000 mg | Freq: Four times a day (QID) | INTRAMUSCULAR | Status: DC | PRN
Start: 2014-06-29 — End: 2014-06-30

## 2014-06-29 MED ORDER — OXYCODONE-ACETAMINOPHEN 5-325 MG PO TABS: 2.0000 | ORAL_TABLET | ORAL | Status: DC | PRN

## 2014-06-29 MED ORDER — CHLOROPROCAINE HCL 3 % IJ SOLN
INTRAMUSCULAR | Status: AC
  Filled 2014-06-29: qty 20

## 2014-06-29 MED ORDER — SODIUM BICARBONATE 8.4 % IV SOLN
INTRAVENOUS | Status: DC | PRN
  Administered 2014-06-29 (×2): 10 mL via EPIDURAL

## 2014-06-29 MED ORDER — ACETAMINOPHEN 325 MG PO TABS: 650.0000 mg | ORAL_TABLET | ORAL | Status: DC | PRN

## 2014-06-29 MED ORDER — SODIUM BICARBONATE 8.4 % IV SOLN
INTRAVENOUS | Status: AC
  Filled 2014-06-29: qty 50

## 2014-06-29 MED ORDER — PHENYLEPHRINE 40 MCG/ML (10ML) SYRINGE FOR IV PUSH (FOR BLOOD PRESSURE SUPPORT)
80.0000 ug | PREFILLED_SYRINGE | INTRAVENOUS | Status: DC | PRN
  Filled 2014-06-29: qty 20

## 2014-06-29 MED ORDER — SCOPOLAMINE 1 MG/3DAYS TD PT72
MEDICATED_PATCH | TRANSDERMAL | Status: AC
  Administered 2014-06-29: 1.5 mg via TRANSDERMAL
  Filled 2014-06-29: qty 1

## 2014-06-29 MED ORDER — OXYTOCIN 10 UNIT/ML IJ SOLN
40.0000 [IU] | INTRAVENOUS | Status: DC | PRN
  Administered 2014-06-29: 40 [IU] via INTRAVENOUS

## 2014-06-29 MED ORDER — OXYTOCIN 10 UNIT/ML IJ SOLN
INTRAMUSCULAR | Status: AC
  Filled 2014-06-29: qty 4

## 2014-06-29 MED ORDER — SCOPOLAMINE 1 MG/3DAYS TD PT72
1.0000 | MEDICATED_PATCH | Freq: Once | TRANSDERMAL | Status: DC
  Administered 2014-06-29: 1.5 mg via TRANSDERMAL

## 2014-06-29 MED ORDER — LACTATED RINGERS IV SOLN
500.0000 mL | Freq: Once | INTRAVENOUS | Status: AC
  Administered 2014-06-29: 500 mL via INTRAVENOUS

## 2014-06-29 MED ORDER — CITRIC ACID-SODIUM CITRATE 334-500 MG/5ML PO SOLN
30.0000 mL | ORAL | Status: DC | PRN
Start: 2014-06-29 — End: 2014-06-30
  Administered 2014-06-29: 30 mL via ORAL
  Filled 2014-06-29: qty 15

## 2014-06-29 MED ORDER — NALBUPHINE HCL 10 MG/ML IJ SOLN
10.0000 mg | INTRAMUSCULAR | Status: DC | PRN
  Filled 2014-06-29: qty 1

## 2014-06-29 MED ORDER — PHENYLEPHRINE 40 MCG/ML (10ML) SYRINGE FOR IV PUSH (FOR BLOOD PRESSURE SUPPORT)
80.0000 ug | PREFILLED_SYRINGE | INTRAVENOUS | Status: DC | PRN
  Administered 2014-06-29: 40 ug via INTRAVENOUS

## 2014-06-29 MED ORDER — DIPHENHYDRAMINE HCL 50 MG/ML IJ SOLN: 12.5000 mg | INTRAMUSCULAR | Status: DC | PRN

## 2014-06-29 MED ORDER — CHLOROPROCAINE HCL 3 % IJ SOLN
INTRAMUSCULAR | Status: DC | PRN
Start: 2014-06-29 — End: 2014-06-29
  Administered 2014-06-29: 20 mL

## 2014-06-29 MED ORDER — LIDOCAINE-EPINEPHRINE (PF) 2 %-1:200000 IJ SOLN
INTRAMUSCULAR | Status: AC
  Filled 2014-06-29: qty 20

## 2014-06-29 MED ORDER — ONDANSETRON HCL 4 MG/2ML IJ SOLN
INTRAMUSCULAR | Status: DC | PRN
  Administered 2014-06-29: 4 mg via INTRAVENOUS

## 2014-06-29 MED ORDER — CEFAZOLIN SODIUM-DEXTROSE 2-3 GM-% IV SOLR
INTRAVENOUS | Status: DC | PRN
  Administered 2014-06-29: 2 g via INTRAVENOUS

## 2014-06-29 MED ORDER — MEPERIDINE HCL 25 MG/ML IJ SOLN
INTRAMUSCULAR | Status: DC | PRN
  Administered 2014-06-29: 25 mg via INTRAVENOUS

## 2014-06-29 MED ORDER — LACTATED RINGERS IV SOLN: 500.0000 mL | INTRAVENOUS | Status: DC | PRN

## 2014-06-29 MED ORDER — CEFAZOLIN SODIUM-DEXTROSE 2-3 GM-% IV SOLR
INTRAVENOUS | Status: AC
  Filled 2014-06-29: qty 50

## 2014-06-29 MED ORDER — BUPIVACAINE-EPINEPHRINE (PF) 0.25% -1:200000 IJ SOLN
INTRAMUSCULAR | Status: AC
  Filled 2014-06-29: qty 30

## 2014-06-29 MED ORDER — HYDROMORPHONE HCL 1 MG/ML IJ SOLN
0.2500 mg | INTRAMUSCULAR | Status: DC | PRN
  Administered 2014-06-30 (×2): 0.5 mg via INTRAVENOUS

## 2014-06-29 MED ORDER — FENTANYL 2.5 MCG/ML BUPIVACAINE 1/10 % EPIDURAL INFUSION (WH - ANES)
14.0000 mL/h | INTRAMUSCULAR | Status: DC | PRN
  Administered 2014-06-29: 14 mL/h via EPIDURAL
  Filled 2014-06-29: qty 125

## 2014-06-29 MED ORDER — BUPIVACAINE HCL (PF) 0.25 % IJ SOLN
INTRAMUSCULAR | Status: AC
  Filled 2014-06-29: qty 30

## 2014-06-29 MED ORDER — OXYCODONE-ACETAMINOPHEN 5-325 MG PO TABS: 1.0000 | ORAL_TABLET | ORAL | Status: DC | PRN

## 2014-06-29 SURGICAL SUPPLY — 33 items
BENZOIN TINCTURE PRP APPL 2/3 (GAUZE/BANDAGES/DRESSINGS) ×2 IMPLANT
CLAMP CORD UMBIL (MISCELLANEOUS) IMPLANT
CLOTH BEACON ORANGE TIMEOUT ST (SAFETY) ×2 IMPLANT
CONTAINER PREFILL 10% NBF 15ML (MISCELLANEOUS) IMPLANT
DRAIN JACKSON PRT FLT 10 (DRAIN) IMPLANT
DRAPE SHEET LG 3/4 BI-LAMINATE (DRAPES) IMPLANT
DRSG OPSITE POSTOP 4X10 (GAUZE/BANDAGES/DRESSINGS) ×2 IMPLANT
DURAPREP 26ML APPLICATOR (WOUND CARE) ×2 IMPLANT
ELECT REM PT RETURN 9FT ADLT (ELECTROSURGICAL) ×2
ELECTRODE REM PT RTRN 9FT ADLT (ELECTROSURGICAL) ×1 IMPLANT
EVACUATOR SILICONE 100CC (DRAIN) IMPLANT
EXTRACTOR VACUUM M CUP 4 TUBE (SUCTIONS) IMPLANT
GLOVE BIO SURGEON STRL SZ 6.5 (GLOVE) ×2 IMPLANT
GLOVE BIOGEL PI IND STRL 7.0 (GLOVE) ×1 IMPLANT
GLOVE BIOGEL PI INDICATOR 7.0 (GLOVE) ×1
GOWN STRL REUS W/TWL LRG LVL3 (GOWN DISPOSABLE) ×4 IMPLANT
KIT ABG SYR 3ML LUER SLIP (SYRINGE) ×4 IMPLANT
NEEDLE HYPO 25X5/8 SAFETYGLIDE (NEEDLE) ×4 IMPLANT
NS IRRIG 1000ML POUR BTL (IV SOLUTION) ×2 IMPLANT
PACK C SECTION WH (CUSTOM PROCEDURE TRAY) ×2 IMPLANT
PAD OB MATERNITY 4.3X12.25 (PERSONAL CARE ITEMS) ×2 IMPLANT
RTRCTR C-SECT PINK 25CM LRG (MISCELLANEOUS) ×2 IMPLANT
STRIP CLOSURE SKIN 1/2X4 (GAUZE/BANDAGES/DRESSINGS) ×2 IMPLANT
SUT CHROMIC 0 CT 1 (SUTURE) ×2 IMPLANT
SUT MNCRL AB 3-0 PS2 27 (SUTURE) ×2 IMPLANT
SUT PLAIN 2 0 (SUTURE) ×2
SUT PLAIN 2 0 XLH (SUTURE) ×2 IMPLANT
SUT PLAIN ABS 2-0 CT1 27XMFL (SUTURE) ×2 IMPLANT
SUT SILK 2 0 SH (SUTURE) IMPLANT
SUT VIC AB 0 CTX 36 (SUTURE) ×4
SUT VIC AB 0 CTX36XBRD ANBCTRL (SUTURE) ×4 IMPLANT
TOWEL OR 17X24 6PK STRL BLUE (TOWEL DISPOSABLE) ×2 IMPLANT
TRAY FOLEY CATH 14FR (SET/KITS/TRAYS/PACK) IMPLANT

## 2014-06-29 NOTE — Op Note (Addendum)
Cesarean Section Procedure Note   Morgan Fleming  06/29/2014  Indications: NON REASSURING FETAL HEART TONES  Pre-operative Diagnosis: non reassuring fetal heart tones.   Post-operative Diagnosis: Same   Surgeon: Surgeon(s) and Role:    * Crawford Givens, MD - Primary   Assistants: Milinda Cave CNM    Anesthesia: epidural   Procedure Details:  The patient was seen in the Holding Room. The risks, benefits, complications, treatment options, and expected outcomes were discussed with the patient. The patient concurred with the proposed plan, giving informed consent. identified as Harrington Challenger and the procedure verified as C-Section Delivery. A Time Out was held and the above information confirmed.  After induction of anesthesia, the patient was draped and prepped in the usual sterile manner. A transverse incision was made and carried down through the subcutaneous tissue to the fascia. Fascial incision was made in the midline and extended transversely. The fascia was separated from the underlying rectus muscle superiorly and inferiorly. The peritoneum was identified and entered. Peritoneal incision was extended longitudinally with good visualization of bowel and bladder. The utero-vesical peritoneal reflection was incised transversely and the bladder flap was bluntly freed from the lower uterine segment.  An alexsis retractor was placed in the abdomen.   A low transverse uterine incision was made. Delivered from cephalic presentation was a  infant, with Apgar scores of 9 at one minute and 9 at five minutes. Cord ph was venous 7.13 and arterial 5.62 the umbilical cord was clamped and cut cord blood was obtained for evaluation. The placenta was removed Intact and appeared normal. The uterine outline, tubes and ovaries appeared normal}. The uterine incision was closed with running locked sutures of 0Vicryl. A second layer 0 vicrlyl was used to imbricate the uterine incision    Hemostasis was observed.  Lavage was carried out until clear. The alexsis was removed.  The peritoneum was closed with 0 chromic.  The muscles were examined and any bleeders were made hemostatic using bovie cautery device.   The fascia was then reapproximated with running sutures of 0 vicryl.  The subcutaneous tissue was reapproximated  With interrupted stitches using 2-0 plain gut. The subcuticular closure was performed using 3-51monocryl     Instrument, sponge, and needle counts were correct prior the abdominal closure and were correct at the conclusion of the case.    Findings: infant was delivered from vertex presentation. The fluid was clear. The infant had the umbilical cord rapped all around his legs and trunk The uterus tubes and ovaries appeared normal.     Estimated Blood Loss: 900cc   Total IV Fluids: 882ml   Urine Output: 40CC OF rose colored urine.  Urine cleared at the end of procedure  Specimens: placenta to pathology  Complications: no complications  Disposition: PACU - hemodynamically stable.   Maternal Condition: stable   Baby condition / location:  Couplet care / Skin to Skin  Attending Attestation: I performed the procedure.   Signed: Surgeon(s): Crawford Givens, MD

## 2014-06-29 NOTE — Progress Notes (Signed)
Vear Staton MRN: [id number]  Subjective: -Call by nurse for prolonged decelerations and patient complaint of rectal pressure.  In to assess.  Patient reporting some comfort with epidural.  Objective: BP 95/58 mmHg  Pulse 70  Temp(Src) 98 F (36.7 C) (Oral)  Resp 36  Ht 5\' 3"  (1.6 m)  Wt 210 lb (95.255 kg)  BMI 37.21 kg/m2  SpO2 97%  Breastfeeding? Unknown   Total I/O In: 1000 [I.V.:1000] Out: 940 [Urine:40; Blood:900] FHT:  120 bpm, Mod Var, +Prolonged & Variable Decels, +Accels UC: Q2-59min   SVE:   Dilation: Lip/rim Effacement (%): 80 Station: 0 Exam by:: Mathayus Stanbery cnm Membranes:AROM of large amt MSF FSE Applied IUPC applied, Amnioinfusion started  Assessment:  IUP at 40wks Cat II FT  Amniotomy NRFHR  Plan: -Intrauterine resuscitative measures and position changes attempted with decrease, but no resolution of Cat II FT -O2 applied, Fluid bolus given -BP remain inconsistently wide range from 86-160/48-160 -Bp cuff changed, ephedrine given -Patient given warm blankets, placed in exaggerated left sims with "peanut" between legs -Dr. Sophronia Simas called and informed of patient status -Upon MD arrival pushing started, but fetal HR remained non reassuring -Patient consented for cesarean section per Dr. Delane Ginger. Dillard recommendation -R/B discussed including but not limited to bleeding, infection, pain, damage to organs and/or fetus resulting in further surgery -Patient agrees to cesarean section -Stat cesarean section called for [facility]  Keana Dueitt LYNN,MSN, CNM 06/29/2014, 11:58 PM

## 2014-06-29 NOTE — H&P (Signed)
Morgan Fleming is a 34 y.o. female, G4P1021 at 40 weeks, presenting for contractions.  Patient call provider reporting contractions every 5 minutes for one hour and was 4 cm in office.  Upon arrival patient coping well with contractions and 7cm. Patient desires epidural and is GBS Negative.  Patient Active Problem List   Diagnosis Date Noted  . Postop check 06/22/2011  . Umbilical hernia 12/87/8676    History of present pregnancy: Patient entered care at 7.1 weeks.   EDC of 06/29/2014 was established by Definite LMP of 09/22/2013.   Anatomy scan:  19 weeks, with left side pylectasis noted and an anterior placenta.   Additional [place] evaluations:   -Anatomy: SIUP, NORMAL FLUID, CX 3.30 CM, GROWTH 54%, NORMAL ANATOMY EXCEPT PYLECTASIS ON THE LEFT, FEMALE.  - F/U for pylectasis at 27wks: Single gestation, breech, normal fluid, pyelectasis is still present on the right, growth equals 78 percentile, cervix measures 4.49 cm. -F/U for pylectasis at 31wks: [place]: SIUP, VERTEX, NORMAL FLUID, CX 4.36, GROWTH 68%, PYLECTASIS STABLE. RTO 2 WEEKS. -F/U for pylectasis at 35wks: EFW 2925g, appropriate interval growth. AFI 15.79cm, Left Kidney 0.78cm, Right kidney 0.78cm. Pyelectasis stable.  -F/U for pylectasis at 38wks: EFW 8+1, 80%ILE, VTX, 18.33, 65%ILE, BILATERAL PYELECTASIS--RIGHT 0.59 (DECREASED FROM 0.78 INITIALLY), LEFT 0.53. Significant prenatal events:  Patient c/o N/V, sciatica pain, and cold symptoms   Last evaluation:  06/29/2014 at [place] by Morgan Fleming  VE: 4/50/-2, FHR 151, BP 110/70, wt 212lbs  OB History    Gravida Para Term Preterm AB TAB SAB Ectopic Multiple Living   2 1        1     -02/2009: SAB -09/2010 Female via VAVD with Epidural.  Delivered by Morgan Fleming -06/2013: SAB -Current   Past Medical History  Diagnosis Date  . Anemia   . Asthma     daily and prn inhaler  . Breast feeding status of mother     instructed to pump and discard x 24 hrs. post-op  . Umbilical hernia    Past Surgical  History  Procedure Laterality Date  . Skin tag removal  1996    right ear  . Dilation and curettage of uterus  03/19/2009    suction D & C, polypectomy  . Vacuum assisted vaginal delivery  10/06/2010    repair of second degree midline lac.  Marland Kitchen Umbilical hernia repair  05/29/2011    Procedure: HERNIA REPAIR UMBILICAL ADULT;  Surgeon: Morgan Crome, MD;  Location: [facility];  Service: General;  Laterality: N/A;  Umbilical Hernia repair with mesh patch   Family History: family history is not on file. Social History:  reports that she has never smoked. She has never used smokeless tobacco. She reports that she does not drink alcohol or use illicit drugs. Patient is a [place] who is married to husband Morgan Fleming.  She reports great support system at home.  Prenatal Transfer Tool  Maternal Diabetes: No Genetic Screening: Normal Maternal Ultrasounds/Referrals: Abnormal:  Findings:   Fetal renal pyelectasis Fetal Ultrasounds or other Referrals:  None Maternal Substance Abuse:  No Significant Maternal Medications:  Meds include: Other: Advair, PNV, ProAir, Iron Significant Maternal Lab Results: Lab values include: Group B Strep negative    ROS:  +ctx, +fm, -lof, -vb,   Allergies  Allergen Reactions  . Mometasone Furoate Swelling    Only lips and nose.  . Montelukast Sodium Nausea And Vomiting     Dilation: 7 Effacement (%): 80  Station: -3 Exam by:: Morgan Fleming, CNM  Blood pressure 121/80, pulse 97, temperature 97.9 F (36.6 C), temperature source Oral, resp. rate 18, height 5\' 3"  (1.6 m), weight 210 lb (95.255 kg), SpO2 100 %.  Chest clear Heart RRR without murmur Abd gravid, NT Pelvic: As above -Proven to 8lbs 2oz Ext: WNL  FHR: 150s  UCs:  Q2-25min, palpates mild  Prenatal labs: ABO, Rh: --/--/O POS (01/11 2000)O Positive Antibody: Negative Rubella:   Immune RPR:   NR HBsAg:   Negative HIV:   Negaitive GBS:  Negative Sickle cell/Hgb electrophoresis:   Normal Pap:  Normal GC:  Negative Chlamydia:  Negative Genetic screenings:  Normal Glucola:  Negative Other:  None    Assessment IUP at 40 wks Active Labor-Transition Stage GBS Negative Desires Epidural  Plan: Admit to [facility] per consult with Dr. Sophronia Simas Routine Labor and Delivery Orders per [facility] Protocol Okay for epidural   Morgan Fantasia, MSN 06/29/2014, 7:43 PM

## 2014-06-29 NOTE — MAU Note (Signed)
Contracting and bloody show. Seen in office today and was 4cm.

## 2014-06-29 NOTE — MAU Note (Signed)
Report given to [place], RN in birthing suites. May transfer to room 176.

## 2014-06-29 NOTE — Anesthesia Preprocedure Evaluation (Addendum)
Anesthesia Evaluation  Patient identified by MRN, date of birth, ID band Patient awake    Reviewed: Allergy & Precautions, H&P , NPO status , Patient's Chart, lab work & pertinent test results  Airway Mallampati: II  TM Distance: >3 FB Neck ROM: full    Dental no notable dental hx.    Pulmonary    Pulmonary exam normal       Cardiovascular negative cardio ROS      Neuro/Psych negative neurological ROS  negative psych ROS   GI/Hepatic negative GI ROS, Neg liver ROS,   Endo/Other  Morbid obesity  Renal/GU negative Renal ROS     Musculoskeletal   Abdominal (+) + obese,   Peds  Hematology   Anesthesia Other Findings   Reproductive/Obstetrics (+) Pregnancy                            Anesthesia Physical Anesthesia Plan  ASA: III and emergent  Anesthesia Plan: Epidural   Post-op Pain Management:    Induction:   Airway Management Planned:   Additional Equipment:   Intra-op Plan:   Post-operative Plan:   Informed Consent: I have reviewed the patients History and Physical, chart, labs and discussed the procedure including the risks, benefits and alternatives for the proposed anesthesia with the patient or authorized representative who has indicated his/her understanding and acceptance.     Plan Discussed with:   Anesthesia Plan Comments: (From L[facility]D for [facility])       Anesthesia Quick Evaluation

## 2014-06-29 NOTE — Progress Notes (Signed)
Renia Mikelson MRN: [id number]  Subjective: -Patient reports increase in contraction intensity.  Requests IV pain medication until able to receive epidural.   Objective: BP 113/82 mmHg  Pulse 100  Temp(Src) 97.9 F (36.6 C) (Oral)  Resp 20  Ht 5\' 3"  (1.6 m)  Wt 210 lb (95.255 kg)  BMI 37.21 kg/m2  SpO2 100%     FHT: 130 bpm, Mod Var, +Variable Decels, +Accels UC:  Q1-62min, palpates moderate to strong  SVE:   Dilation: 8 Effacement (%): 80 Station: -3 Exam by:: Anesia Blackwell cnm Membranes:Bulging Pitocin:None  Assessment:  IUP at 40 wks Cat II FT Active Labor-Transition  Plan: -Patient okay for IV nubain until epidural ready -Declines AROM at present -Continue other mgmt as ordered  Roise Emert LYNN,MSN, CNM 06/29/2014, 8:35 PM

## 2014-06-29 NOTE — Transfer of Care (Signed)
Immediate Anesthesia Transfer of Care Note  Patient: Morgan Fleming  Procedure(s) Performed: Procedure(s): CESAREAN SECTION (N/A)  Patient Location: PACU  Anesthesia Type:Epidural  Level of Consciousness: awake, alert  and oriented  Airway & Oxygen Therapy: Patient Spontanous Breathing  Post-op Assessment: Report given to PACU RN and Post -op Vital signs reviewed and stable  Post vital signs: Reviewed and stable  Complications: No apparent anesthesia complications

## 2014-06-30 DIAGNOSIS — Z98891 History of uterine scar from previous surgery: Secondary | ICD-10-CM

## 2014-06-30 DIAGNOSIS — Z8759 Personal history of other complications of pregnancy, childbirth and the puerperium: Secondary | ICD-10-CM

## 2014-06-30 LAB — ABO/RH: ABO/RH(D): O POS

## 2014-06-30 LAB — CBC
HCT: 25.7 % — ABNORMAL LOW (ref 36.0–46.0)
Hemoglobin: 8.9 g/dL — ABNORMAL LOW (ref 12.0–15.0)
MCH: 34.1 pg — AB (ref 26.0–34.0)
MCHC: 34.2 g/dL (ref 30.0–36.0)
MCV: 99.6 fL (ref 78.0–100.0)
Platelets: 205 10*3/uL (ref 150–400)
RBC: 2.58 MIL/uL — ABNORMAL LOW (ref 3.87–5.11)
RDW: 13.5 % (ref 11.5–15.5)
WBC: 17.2 10*3/uL — ABNORMAL HIGH (ref 4.0–10.5)

## 2014-06-30 LAB — TYPE AND SCREEN
ABO/RH(D): O POS
Antibody Screen: NEGATIVE

## 2014-06-30 MED ORDER — DIPHENHYDRAMINE HCL 50 MG/ML IJ SOLN: 12.5000 mg | INTRAMUSCULAR | Status: DC | PRN

## 2014-06-30 MED ORDER — OXYCODONE-ACETAMINOPHEN 5-325 MG PO TABS
2.0000 | ORAL_TABLET | ORAL | Status: DC | PRN
  Administered 2014-07-02: 2 via ORAL
  Filled 2014-06-30 (×2): qty 2

## 2014-06-30 MED ORDER — IBUPROFEN 600 MG PO TABS
600.0000 mg | ORAL_TABLET | Freq: Four times a day (QID) | ORAL | Status: DC
  Administered 2014-06-30 – 2014-07-02 (×9): 600 mg via ORAL
  Filled 2014-06-30 (×10): qty 1

## 2014-06-30 MED ORDER — KETOROLAC TROMETHAMINE 30 MG/ML IJ SOLN
INTRAMUSCULAR | Status: AC
Start: 2014-06-30 — End: 2014-06-30
  Filled 2014-06-30: qty 1

## 2014-06-30 MED ORDER — SIMETHICONE 80 MG PO CHEW: 80.0000 mg | CHEWABLE_TABLET | ORAL | Status: DC | PRN

## 2014-06-30 MED ORDER — SENNOSIDES-DOCUSATE SODIUM 8.6-50 MG PO TABS
2.0000 | ORAL_TABLET | ORAL | Status: DC
  Administered 2014-07-01 (×2): 2 via ORAL
  Filled 2014-06-30 (×2): qty 2

## 2014-06-30 MED ORDER — PROMETHAZINE HCL 25 MG/ML IJ SOLN
INTRAMUSCULAR | Status: AC
Start: 2014-06-30 — End: 2014-06-30
  Administered 2014-06-30: 12.5 mg via INTRAVENOUS
  Filled 2014-06-30: qty 1

## 2014-06-30 MED ORDER — PRENATAL MULTIVITAMIN CH
1.0000 | ORAL_TABLET | Freq: Every day | ORAL | Status: DC
  Administered 2014-06-30 – 2014-07-02 (×3): 1 via ORAL
  Filled 2014-06-30 (×3): qty 1

## 2014-06-30 MED ORDER — LACTATED RINGERS IV SOLN
INTRAVENOUS | Status: DC
  Administered 2014-06-30: 09:00:00 via INTRAVENOUS

## 2014-06-30 MED ORDER — DIPHENHYDRAMINE HCL 25 MG PO CAPS: 25.0000 mg | ORAL_CAPSULE | ORAL | Status: DC | PRN

## 2014-06-30 MED ORDER — FERROUS SULFATE 325 (65 FE) MG PO TABS
325.0000 mg | ORAL_TABLET | Freq: Two times a day (BID) | ORAL | Status: DC
  Administered 2014-06-30 – 2014-07-01 (×3): 325 mg via ORAL
  Filled 2014-06-30 (×3): qty 1

## 2014-06-30 MED ORDER — LANOLIN HYDROUS EX OINT
1.0000 | TOPICAL_OINTMENT | CUTANEOUS | Status: DC | PRN
Start: 2014-06-30 — End: 2014-07-02

## 2014-06-30 MED ORDER — HYDROMORPHONE HCL 1 MG/ML IJ SOLN
INTRAMUSCULAR | Status: AC
  Administered 2014-06-30: 0.5 mg via INTRAVENOUS
  Filled 2014-06-30: qty 1

## 2014-06-30 MED ORDER — FERROUS SULFATE 325 (65 FE) MG PO TABS
325.0000 mg | ORAL_TABLET | Freq: Two times a day (BID) | ORAL | Status: DC
  Administered 2014-07-01 – 2014-07-02 (×2): 325 mg via ORAL
  Filled 2014-06-30 (×2): qty 1

## 2014-06-30 MED ORDER — MENTHOL 3 MG MT LOZG: 1.0000 | LOZENGE | OROMUCOSAL | Status: DC | PRN

## 2014-06-30 MED ORDER — DIPHENHYDRAMINE HCL 25 MG PO CAPS: 25.0000 mg | ORAL_CAPSULE | Freq: Four times a day (QID) | ORAL | Status: DC | PRN

## 2014-06-30 MED ORDER — NALBUPHINE HCL 10 MG/ML IJ SOLN: 5.0000 mg | Freq: Once | INTRAMUSCULAR | Status: AC | PRN

## 2014-06-30 MED ORDER — ZOLPIDEM TARTRATE 5 MG PO TABS: 5.0000 mg | ORAL_TABLET | Freq: Every evening | ORAL | Status: DC | PRN

## 2014-06-30 MED ORDER — ONDANSETRON HCL 4 MG PO TABS: 4.0000 mg | ORAL_TABLET | ORAL | Status: DC | PRN

## 2014-06-30 MED ORDER — TETANUS-DIPHTH-ACELL PERTUSSIS 5-2.5-18.5 LF-MCG/0.5 IM SUSP: 0.5000 mL | Freq: Once | INTRAMUSCULAR | Status: DC

## 2014-06-30 MED ORDER — OXYTOCIN 40 UNITS IN LACTATED RINGERS INFUSION - SIMPLE MED: 62.5000 mL/h | INTRAVENOUS | Status: AC

## 2014-06-30 MED ORDER — NALOXONE HCL 0.4 MG/ML IJ SOLN: 0.4000 mg | INTRAMUSCULAR | Status: DC | PRN

## 2014-06-30 MED ORDER — KETOROLAC TROMETHAMINE 30 MG/ML IJ SOLN: 30.0000 mg | Freq: Once | INTRAMUSCULAR | Status: DC

## 2014-06-30 MED ORDER — DIBUCAINE 1 % RE OINT: 1.0000 "application " | TOPICAL_OINTMENT | RECTAL | Status: DC | PRN

## 2014-06-30 MED ORDER — IBUPROFEN 600 MG PO TABS
600.0000 mg | ORAL_TABLET | Freq: Four times a day (QID) | ORAL | Status: DC | PRN
  Administered 2014-07-01: 600 mg via ORAL

## 2014-06-30 MED ORDER — WITCH HAZEL-GLYCERIN EX PADS: 1.0000 "application " | MEDICATED_PAD | CUTANEOUS | Status: DC | PRN

## 2014-06-30 MED ORDER — MEASLES, MUMPS & RUBELLA VAC ~~LOC~~ INJ
0.5000 mL | INJECTION | Freq: Once | SUBCUTANEOUS | Status: DC
  Filled 2014-06-30: qty 0.5

## 2014-06-30 MED ORDER — FLEET ENEMA 7-19 GM/118ML RE ENEM: 1.0000 | ENEMA | Freq: Every day | RECTAL | Status: DC | PRN

## 2014-06-30 MED ORDER — SIMETHICONE 80 MG PO CHEW
80.0000 mg | CHEWABLE_TABLET | Freq: Three times a day (TID) | ORAL | Status: DC
  Administered 2014-06-30 – 2014-07-02 (×7): 80 mg via ORAL
  Filled 2014-06-30 (×7): qty 1

## 2014-06-30 MED ORDER — BISACODYL 10 MG RE SUPP: 10.0000 mg | Freq: Every day | RECTAL | Status: DC | PRN

## 2014-06-30 MED ORDER — ONDANSETRON HCL 4 MG/2ML IJ SOLN: 4.0000 mg | INTRAMUSCULAR | Status: DC | PRN

## 2014-06-30 MED ORDER — NALBUPHINE HCL 10 MG/ML IJ SOLN: 5.0000 mg | INTRAMUSCULAR | Status: DC | PRN

## 2014-06-30 MED ORDER — NALOXONE HCL 1 MG/ML IJ SOLN
1.0000 ug/kg/h | INTRAMUSCULAR | Status: DC | PRN
  Filled 2014-06-30: qty 2

## 2014-06-30 MED ORDER — SIMETHICONE 80 MG PO CHEW
80.0000 mg | CHEWABLE_TABLET | ORAL | Status: DC
  Administered 2014-07-01 (×2): 80 mg via ORAL
  Filled 2014-06-30 (×2): qty 1

## 2014-06-30 MED ORDER — SODIUM CHLORIDE 0.9 % IJ SOLN: 3.0000 mL | INTRAMUSCULAR | Status: DC | PRN

## 2014-06-30 MED ORDER — ONDANSETRON HCL 4 MG/2ML IJ SOLN: 4.0000 mg | Freq: Three times a day (TID) | INTRAMUSCULAR | Status: DC | PRN

## 2014-06-30 MED ORDER — OXYCODONE-ACETAMINOPHEN 5-325 MG PO TABS
1.0000 | ORAL_TABLET | ORAL | Status: DC | PRN
  Administered 2014-07-02 (×2): 1 via ORAL
  Filled 2014-06-30 (×2): qty 1

## 2014-06-30 NOTE — Addendum Note (Signed)
Addendum  created 06/30/14 6811 by Georgeanne Nim, CRNA   Modules edited: Notes Section   Notes Section:  File: [id number]

## 2014-06-30 NOTE — Progress Notes (Addendum)
Subjective: Postpartum Day 1: Cesarean Delivery due to NRFHT Patient up ad lib, reports no syncope or dizziness. Feeding:  breast Contraceptive plan:  none  Objective: Vital signs in last 24 hours: Temp:  [97.6 F (36.4 C)-98.8 F (37.1 C)] 98.8 F (37.1 C) (01/12 0500) Pulse Rate:  [43-278] 84 (01/12 0500) Resp:  [16-36] 20 (01/12 0500) BP: (86-180)/(42-160) 110/59 mmHg (01/12 0500) SpO2:  [78 %-100 %] 94 % (01/12 0500) Weight:  [210 lb (95.255 kg)] 210 lb (95.255 kg) (01/11 1914)  Physical Exam:  General: alert and cooperative Lochia: appropriate Uterine Fundus: firm Abdomen:  + bowel sounds, non distended Incision: healing well  Honeycomb dressing  DVT Evaluation: No evidence of DVT seen on physical exam. Homan's sign: Negative   Recent Labs  06/29/14 2000 06/30/14 0545  HGB 11.7* 8.9*  HCT 34.3* 25.7*  WBC 17.7* 17.2*    Assessment: Status post Cesarean section day 5. Doing well postoperatively.  Honeycomb dressing in place, no significant drainage JP drain in place, no significant drainage ~ 25 cc last 24 hours by verbal report Anemia - hemodynamicly stable.  Declines transfusion  Plan: Continue current care. Breastfeeding, Lactation consult and Circumcision prior to discharge Iron supplement BID   Venus Standard, CNM, MSN 06/30/2014. 12:26 PM  Addendum: No JP drain in this patient.  Donnel Saxon, CNM 07/01/14 8:10p

## 2014-06-30 NOTE — Anesthesia Postprocedure Evaluation (Signed)
  Anesthesia Post-op Note  Patient: Morgan Fleming  Procedure(s) Performed: Procedure(s): CESAREAN SECTION (N/A)  Patient Location: Mother/Baby  Anesthesia Type:Epidural  Level of Consciousness: awake, alert  and oriented  Airway and Oxygen Therapy: Patient Spontanous Breathing  Post-op Pain: mild  Post-op Assessment: Patient's Cardiovascular Status Stable, Respiratory Function Stable, Pain level controlled, No headache, No backache, No residual numbness and No residual motor weakness  Post-op Vital Signs: stable  Last Vitals:  Filed Vitals:   06/30/14 0500  BP: 110/59  Pulse: 84  Temp: 37.1 C  Resp: 20    Complications: No apparent anesthesia complications

## 2014-06-30 NOTE — Anesthesia Postprocedure Evaluation (Signed)
Anesthesia Post Note  Patient: Morgan Fleming  Procedure(s) Performed: Procedure(s) (LRB): CESAREAN SECTION (N/A)  Anesthesia type: Epidural  Patient location: PACU  Post pain: Pain level controlled  Post assessment: Post-op Vital signs reviewed  Last Vitals:  Filed Vitals:   06/30/14 0015  BP: 110/59  Pulse: 76  Temp:   Resp: 26    Post vital signs: Reviewed  Level of consciousness: awake  Complications: No apparent anesthesia complications

## 2014-06-30 NOTE — Lactation Note (Signed)
This note was copied from the chart of [place]. Lactation Consultation Note  Patient Name: Morgan Fleming EFEOF'H Date: 06/30/2014 Reason for consult: Initial assessment        Mom and baby now 46 hour spost partum. Mom breast feeding baby in cradle hold with deep latch. He has fed multiple times already. Mom an experienced breast feeder. I suggested mom latch in cross cradle hold to obtain a deep latch, and the switch to cradle. Lactation services reviewed with mom. She knows to call for questions/concerns.    Maternal Data Formula Feeding for Exclusion: No Has patient been taught Hand Expression?: Yes Does the patient have breastfeeding experience prior to this delivery?: Yes  Feeding Feeding Type: Breast Fed Length of feed: 23 min  LATCH Score/Interventions Latch: Grasps breast easily, tongue down, lips flanged, rhythmical sucking. Intervention(s): Adjust position  Audible Swallowing: None  Type of Nipple: Everted at rest and after stimulation  Comfort (Breast/Nipple): Soft / non-tender     Hold (Positioning): No assistance needed to correctly position infant at breast.  LATCH Score: 8  Lactation Tools Discussed/Used     Consult Status Consult Status: Follow-up Date: 07/01/14 Follow-up type: In-patient    Tonna Corner 06/30/2014, 10:14 AM

## 2014-07-01 ENCOUNTER — Encounter (HOSPITAL_COMMUNITY): Payer: Self-pay | Admitting: Obstetrics and Gynecology

## 2014-07-01 LAB — CBC
HCT: 26.4 % — ABNORMAL LOW (ref 36.0–46.0)
Hemoglobin: 9 g/dL — ABNORMAL LOW (ref 12.0–15.0)
MCH: 34.5 pg — AB (ref 26.0–34.0)
MCHC: 34.1 g/dL (ref 30.0–36.0)
MCV: 101.1 fL — ABNORMAL HIGH (ref 78.0–100.0)
PLATELETS: 203 10*3/uL (ref 150–400)
RBC: 2.61 MIL/uL — ABNORMAL LOW (ref 3.87–5.11)
RDW: 13.7 % (ref 11.5–15.5)
WBC: 12.9 10*3/uL — ABNORMAL HIGH (ref 4.0–10.5)

## 2014-07-01 LAB — RPR: RPR Ser Ql: NONREACTIVE

## 2014-07-01 NOTE — Progress Notes (Signed)
Morgan Fleming [id number] Postpartum Day 2 S/P primary cesarean section due to [facility]  Subjective: Patient up ad lib, denies syncope or dizziness. Reports consuming regular diet without issues and denies N/V. Patient reports (positive/negative) bowel movement and is passing flatus.  Denies issues with urination and reports bleeding is WNL.  Patient is breastfeeding and reports going well.   Pain is being appropriately managed with use of po meds.   She is undecided regarding contraception.  She is still "working through" emergent delivery by C/S--understands rationale.   Objective: Temp:  [98.2 F (36.8 C)-99.5 F (37.5 C)] 98.8 F (37.1 C) (01/13 0630) Pulse Rate:  [74-99] 76 (01/13 0630) Resp:  [18-20] 20 (01/13 0630) BP: (98-125)/(53-77) 125/68 mmHg (01/13 0630) SpO2:  [94 %-96 %] 96 % (01/12 1200)   Recent Labs  06/29/14 2000 06/30/14 0545 07/01/14 0643  HGB 11.7* 8.9* 9.0*  HCT 34.3* 25.7* 26.4*  WBC 17.7* 17.2* 12.9*    Physical Exam:  General: alert Lochia: appropriate Uterine Fundus: firm Abdomen:  + bowel sounds, mild gaseous distension Incision: Honeycomb dressing CDI DVT Evaluation: No evidence of DVT seen on physical exam. Negative Homan's sign. No JP drain.   Assessment Day 2 s/p primary LTCS due to NRFHR Mild anemia, without hemodynamic instability Doing well  Plan: Discussed issue of unexpected C/S, support offered. Reviewed decision-making for contraception. Anticipate d/c tomorrow. Fe BID     Donnel Saxon MSN, CNM 07/01/2014, 8:04 AM

## 2014-07-01 NOTE — Lactation Note (Signed)
This note was copied from the chart of [name]. Lactation Consultation Note  P2, Ex BF.  Baby latched on left breast in football upon entering the room. Mother states her nipples are starting to get sore.  Unlatched baby and her nipple had a compression ridge. Demonstrated how to achieve a deeper latch and keep him deep.  Sucks and swallows observed. Provided mother w/ comfort gels.  Mother's breast are filling. Mother also noticed that breast tissue pouch under right armpit is starting to get firm.  Suggest she massage during feeding. Provided mother w/ hand pump and she can also pump the right breast and massage area to soften. Reviewed engorgement care and suggest she call if she needs further assistance.  Patient Name: Morgan Fleming HUTML'Y Date: 07/01/2014 Reason for consult: Follow-up assessment   Maternal Data    Feeding    LATCH Score/Interventions Latch: Grasps breast easily, tongue down, lips flanged, rhythmical sucking. (latched upon entering)  Audible Swallowing: Spontaneous and intermittent  Type of Nipple: Everted at rest and after stimulation  Comfort (Breast/Nipple): Filling, red/small blisters or bruises, mild/mod discomfort  Problem noted: Mild/Moderate discomfort Interventions (Mild/moderate discomfort): Comfort gels;Hand expression  Hold (Positioning): No assistance needed to correctly position infant at breast.  LATCH Score: 9  Lactation Tools Discussed/Used     Consult Status Consult Status: Follow-up Date: 07/02/14 Follow-up type: In-patient    Vivianne Master [facility] 07/01/2014, 3:38 PM

## 2014-07-01 NOTE — Progress Notes (Signed)
RN in room per pt request and pt informed RN of edema under rt arm and bilateral engorgement . RN noticed a lump size of large egg under RT. ARM  In axillary area. Pt stated very tender to touch and painful. Lump very hard. Bilateral breast filled with milk and becoming harder. Lactation consultant phoned and notified and ice recommended bilaterally to affected area. Ice packs made x 2 and one each placed under each arm for 15-20 minutes. Pt instructed in use and  time of ice treatments. Plan to reevaluate in 2-3 hours to check for reduction in edema

## 2014-07-01 NOTE — Progress Notes (Signed)
rn IN ROOM FOR MEDICATION ADMINISTRATION. PT STATED BREST LARGER NOW THAN BEFORE. PT STATED HAS BEEN PUMPING WITH HAND PUMP AFTER ICE WAS APPLIED EARLIER. PT STATED INFANT WAS BREASTFEEDING ALSO. PT STATES  PAIN SCALE 6 AT PRESENT. Bluford Main RN REEVALUATED LUMP UNDER RT. AXILLARY AREA NAD NOTED BREAT AND LUMP FIRMED NA MORE EDMATOUS, INCREASED SLIGHTLY FROM LARGE EGG.

## 2014-07-02 MED ORDER — ONDANSETRON HCL 4 MG/2ML IJ SOLN: 4.0000 mg | Freq: Three times a day (TID) | INTRAMUSCULAR | Status: DC | PRN

## 2014-07-02 MED ORDER — OXYCODONE-ACETAMINOPHEN 5-325 MG PO TABS: 1.0000 | ORAL_TABLET | Freq: Four times a day (QID) | ORAL | Status: DC | PRN

## 2014-07-02 MED ORDER — ONDANSETRON HCL 4 MG PO TABS: 4.0000 mg | ORAL_TABLET | ORAL | Status: DC | PRN

## 2014-07-02 MED ORDER — SIMETHICONE 80 MG PO CHEW: 80.0000 mg | CHEWABLE_TABLET | Freq: Two times a day (BID) | ORAL | Status: DC

## 2014-07-02 MED ORDER — SENNOSIDES-DOCUSATE SODIUM 8.6-50 MG PO TABS: 2.0000 | ORAL_TABLET | Freq: Every day | ORAL | Status: DC

## 2014-07-02 MED ORDER — IBUPROFEN 600 MG PO TABS: 600.0000 mg | ORAL_TABLET | Freq: Four times a day (QID) | ORAL | Status: DC | PRN

## 2014-07-02 MED ORDER — FERROUS SULFATE 325 (65 FE) MG PO TABS: 325.0000 mg | ORAL_TABLET | Freq: Two times a day (BID) | ORAL | Status: DC

## 2014-07-02 NOTE — Lactation Note (Signed)
This note was copied from the chart of [place]. Lactation Consultation Note  Patient Name: [facility] [id number] Date: 07/02/2014 Reason for consult: Follow-up assessment              MOm and term baby, now 28 hours old. Baby latched with audible swallows. Mom's breasts are very full, easily expressed milk. She has a knot of milk under her right armpit. Heat was applied, and after breast feeding, mom it going to pump with DEP to soften breast to comfort. Mom is also using ice intermittently , to prevent further engorgement. I told mom I would check back after she pumps. If she is soft, then she is mostly full, not engorged. Mom and baby other wise dong well, and mom very receptive to teaching. Dad very supportive of mom also.    Maternal Data    Feeding Feeding Type: Breast Fed Length of feed:  (latached when I walked in the room, and when I left)  LATCH Score/Interventions Latch: Grasps breast easily, tongue down, lips flanged, rhythmical sucking.  Audible Swallowing: Spontaneous and intermittent  Type of Nipple: Everted at rest and after stimulation  Comfort (Breast/Nipple): Filling, red/small blisters or bruises, mild/mod discomfort Problem noted: Engorgment Intervention(s): Ice;Hand expression;Reverse pressure  Problem noted: Mild/Moderate discomfort Interventions (Mild/moderate discomfort): Post-pump Interventions (Severe discomfort): Double electric pum  Hold (Positioning): No assistance needed to correctly position infant at breast. Intervention(s): Breastfeeding basics reviewed;Support Pillows;Position options;Skin to skin  LATCH Score: 9  Lactation Tools Discussed/Used     Consult Status Consult Status: Follow-up Date: 07/03/14 Follow-up type: In-patient    Tonna Corner 07/02/2014, 8:58 AM

## 2014-07-02 NOTE — Discharge Summary (Signed)
Cesarean Section Delivery Discharge Summary  Morgan Fleming  DOB:    [birth year] MRN:    [id number] CSN:    [id number]  Date of admission:                  Jun 29, 2014  Date of discharge:                   Jul 02, 2014  Procedures this admission: Unscheduled Primary C/S for Morgan Fleming after presenting in active labor  Date of Delivery: Jun 29, 2014  Newborn Data:  Live born female  Birth Weight: 8 lb 8 oz (3855 g) APGAR: 9, 9  Home with mother. Name: Morgan Fleming Circumcision Plan: Inpatient  History of Present Illness:  Morgan Fleming is a 34 y.o. female, [id number], who presents at [redacted]w[redacted]d weeks gestation. The patient [facility]    Her pregnancy has been complicated by:  Patient Active Problem List   Diagnosis Date Noted  . S/P emergency cesarean section 06/30/2014  . History of vacuum extraction assisted delivery 06/30/2014  . Active labor at term 06/29/2014  . Postop check 06/22/2011  . Umbilical hernia 15/10/6977   none.   Hospital Course--Unscheduled Cesarean:  Admitted Jun 29, 2014. Negative GBS.  Utilized epidural for pain management.   Patient progressed to 9cm with an anterior lip.  Due to fetal bradycardia, she was consented for cesarean, with Dr. Delane Fleming. [facility] performing a primary LTCS under epidural anesthesia, with delivery of a viable female, with weight and Apgars as listed below. Infant was in good condition and remained at the patient's bedside.  The patient was taken to recovery in good condition.  Patient planned to breast feed.  On post-op day 1, patient was doing well, tolerating a regular diet, with Hgb of 8.9. Iron supplementation was started and patient tolerated well. Throughout her stay, her physical exam was WNL, her incision was CDI, and her vital signs remained stable.  By post-op day 3, she was up ad lib, tolerating a regular diet, with good pain control with  po med.  She was deemed to have received the full benefit of her hospital stay, and was discharged home in stable condition.  Contraceptive choice was undecided and patient was sent with information sheets.    Feeding:  breast  Contraception:  Undecided  Discharge hemoglobin:  HEMOGLOBIN  Date Value Ref Range Status  07/01/2014 9.0* 12.0 - 15.0 g/dL Final  06/30/2014 8.9* 12.0 - 15.0 g/dL Final    Comment:    DELTA CHECK NOTED REPEATED TO VERIFY   06/29/2014 11.7* 12.0 - 15.0 g/dL Final   HCT  Date Value Ref Range Status  07/01/2014 26.4* 36.0 - 46.0 % Final  06/30/2014 25.7* 36.0 - 46.0 % Final  06/29/2014 34.3* 36.0 - 46.0 % Final   WBC  Date Value Ref Range Status  07/01/2014 12.9* 4.0 - 10.5 K/uL Final  06/30/2014 17.2* 4.0 - 10.5 K/uL Final  06/29/2014 17.7* 4.0 - 10.5 K/uL Final    Discharge Physical Exam:   General: alert, cooperative and no distress Lochia: appropriate Uterine Fundus: firm Abdomen:  + bowel sounds, Soft, appropriately tender, +Distention Incision: healing well, Honeycomb dressing removed, Steri-strips replaced DVT Evaluation: No evidence of DVT seen on physical exam. No cords or calf tenderness. Calf/Ankle edema is present, but is non-pitting  Intrapartum Procedures: cesarean: low cervical, transverse Postpartum Procedures: none Complications-Operative and Postpartum: none  Discharge Diagnoses: Term Pregnancy-delivered, S/P Emergency C/S-Primary  Discharge Information:  Activity:           pelvic rest, light activity  Diet:                routine Medications: PNV, Ibuprofen, Colace, Iron and Percocet Condition:      stable Instructions: Incision care, Postpartum Depression, Activity Restrictions, Breastfeeding, Who and when to call for PP Concerns, Follow Up Appointment, Information Sheets given: C/S care, Iron Rich Diet, Contraceptive Choices   Discharge to: home  Follow-up Information    [facility] with [facility] Schedule an appointment as soon as possible for a visit in 5 weeks.   Specialty:  Obstetrics and Gynecology   Why:  Please call if you have any questions or concerns prior to your next visit. , Remove steri-strips in 7 days if applicable.   Contact information:   [facility]. [address] [place] [id number] [phone]       Morgan Muta MSN, CNM 07/02/2014 11:15 AM

## 2014-07-02 NOTE — Lactation Note (Signed)
This note was copied from the chart of [place]. Lactation Consultation Note Mom in a lot of pain, milk backed up in axillary glands. Rt. More prominent than Lt. ICE applied, hand massaged, mom hand pumped, got out 108ml. Hand expressed and massaged 33ml. BF baby, encouraged mom to massage breast during BF to assist in softening breast. BF well to Rt. Breast which is hard and knoty. Very tender and hurting laid HOB back some and breast softened and able to hand express better. Encouraged mom to take pain medication to relax. Engorgement prevention and maintance taught.  Patient Name: [facility] [id number] Date: 07/02/2014 Reason for consult: Follow-up assessment;Breast/nipple pain   Maternal Data    Feeding Feeding Type: Breast Fed Length of feed: 20 min  LATCH Score/Interventions Latch: Grasps breast easily, tongue down, lips flanged, rhythmical sucking. Intervention(s): Breast massage;Breast compression  Audible Swallowing: Spontaneous and intermittent Intervention(s): Skin to skin;Hand expression Intervention(s): Alternate breast massage  Type of Nipple: Everted at rest and after stimulation  Comfort (Breast/Nipple): Engorged, cracked, bleeding, large blisters, severe discomfort Problem noted: Engorgment Intervention(s): Ice;Hand expression  Problem noted: Severe discomfort Interventions (Mild/moderate discomfort): Hand massage;Hand expression;Comfort gels;Pre-pump if needed  Hold (Positioning): No assistance needed to correctly position infant at breast. Intervention(s): Support Pillows;Position options;Skin to skin  LATCH Score: 8  Lactation Tools Discussed/Used Tools: Pump;Comfort gels Breast pump type: Manual   Consult Status Consult Status: Follow-up Follow-up type: In-patient    Theodoro Kalata 07/02/2014, 3:16 AM

## 2014-07-02 NOTE — Discharge Instructions (Signed)
Postpartum Depression and Baby Blues °The postpartum period begins right after the birth of a baby. During this time, there is often a great amount of joy and excitement. It is also a time of many changes in the life of the parents. Regardless of how many times a mother gives birth, each child brings new challenges and dynamics to the family. It is not unusual to have feelings of excitement along with confusing shifts in moods, emotions, and thoughts. All mothers are at risk of developing postpartum depression or the "baby blues." These mood changes can occur right after giving birth, or they may occur many months after giving birth. The baby blues or postpartum depression can be mild or severe. Additionally, postpartum depression can go away rather quickly, or it can be a long-term condition.  °CAUSES °Raised hormone levels and the rapid drop in those levels are thought to be a main cause of postpartum depression and the baby blues. A number of hormones change during and after pregnancy. Estrogen and progesterone usually decrease right after the delivery of your baby. The levels of thyroid hormone and various cortisol steroids also rapidly drop. Other factors that play a role in these mood changes include major life events and genetics.  °RISK FACTORS °If you have any of the following risks for the baby blues or postpartum depression, know what symptoms to watch out for during the postpartum period. Risk factors that may increase the likelihood of getting the baby blues or postpartum depression include: °· Having a personal or family history of depression.   °· Having depression while being pregnant.   °· Having premenstrual mood issues or mood issues related to oral contraceptives. °· Having a lot of life stress.   °· Having marital conflict.   °· Lacking a social support network.   °· Having a baby with special needs.   °· Having health problems, such as diabetes.   °SIGNS AND SYMPTOMS °Symptoms of baby blues  include: °· Brief changes in mood, such as going from extreme happiness to sadness. °· Decreased concentration.   °· Difficulty sleeping.   °· Crying spells, tearfulness.   °· Irritability.   °· Anxiety.   °Symptoms of postpartum depression typically begin within the first month after giving birth. These symptoms include: °· Difficulty sleeping or excessive sleepiness.   °· Marked weight loss.   °· Agitation.   °· Feelings of worthlessness.   °· Lack of interest in activity or food.   °Postpartum psychosis is a very serious condition and can be dangerous. Fortunately, it is rare. Displaying any of the following symptoms is cause for immediate medical attention. Symptoms of postpartum psychosis include:  °· Hallucinations and delusions.   °· Bizarre or disorganized behavior.   °· Confusion or disorientation.   °DIAGNOSIS  °A diagnosis is made by an evaluation of your symptoms. There are no medical or lab tests that lead to a diagnosis, but there are various questionnaires that a health care provider may use to identify those with the baby blues, postpartum depression, or psychosis. Often, a screening tool called the Edinburgh Postnatal Depression Scale is used to diagnose depression in the postpartum period.  °TREATMENT °The baby blues usually goes away on its own in 1-2 weeks. Social support is often all that is needed. You will be encouraged to get adequate sleep and rest. Occasionally, you may be given medicines to help you sleep.  °Postpartum depression requires treatment because it can last several months or longer if it is not treated. Treatment may include individual or group therapy, medicine, or both to address any social, physiological, and psychological   factors that may play a role in the depression. Regular exercise, a healthy diet, rest, and social support may also be strongly recommended.  Postpartum psychosis is more serious and needs treatment right away. Hospitalization is often needed. HOME CARE  INSTRUCTIONS  Get as much rest as you can. Nap when the baby sleeps.   Exercise regularly. Some women find yoga and walking to be beneficial.   Eat a balanced and nourishing diet.   Do little things that you enjoy. Have a cup of tea, take a bubble bath, read your favorite magazine, or listen to your favorite music.  Avoid alcohol.   Ask for help with household chores, cooking, grocery shopping, or running errands as needed. Do not try to do everything.   Talk to people close to you about how you are feeling. Get support from your partner, family members, friends, or other new moms.  Try to stay positive in how you think. Think about the things you are grateful for.   Do not spend a lot of time alone.   Only take over-the-counter or prescription medicine as directed by your health care provider.  Keep all your postpartum appointments.   Let your health care provider know if you have any concerns.  SEEK MEDICAL CARE IF: You are having a reaction to or problems with your medicine. SEEK IMMEDIATE MEDICAL CARE IF:  You have suicidal feelings.   You think you may harm the baby or someone else. MAKE SURE YOU:  Understand these instructions.  Will watch your condition.  Will get help right away if you are not doing well or get worse. Document Released: 03/09/2004 Document Revised: 06/10/2013 Document Reviewed: 03/17/2013 [facility] Patient Information 2015 [place], [place]. This information is not intended to replace advice given to you by your health care provider. Make sure you discuss any questions you have with your health care provider. Iron-Rich Diet An iron-rich diet contains foods that are good sources of iron. Iron is an important mineral that helps your body produce hemoglobin. Hemoglobin is a protein in red blood cells that carries oxygen to the body's tissues. Sometimes, the iron level in your blood can be low. This may be caused by:  A lack of iron in your  diet.  Blood loss.  Times of growth, such as during pregnancy or during a child's growth and development. Low levels of iron can cause a decrease in the number of red blood cells. This can result in iron deficiency anemia. Iron deficiency anemia symptoms include:  Tiredness.  Weakness.  Irritability.  Increased chance of infection. Here are some recommendations for daily iron intake:  Males older than 34 years of age need 8 mg of iron per day.  Women ages 35 to 63 need 18 mg of iron per day.  Pregnant women need 27 mg of iron per day, and women who are over 66 years of age and breastfeeding need 9 mg of iron per day.  Women over the age of 70 need 8 mg of iron per day. SOURCES OF IRON There are 2 types of iron that are found in food: heme iron and nonheme iron. Heme iron is absorbed by the body better than nonheme iron. Heme iron is found in meat, poultry, and fish. Nonheme iron is found in grains, beans, and vegetables. Heme Iron Sources Food / Iron (mg)  Chicken liver, 3 oz (85 g)/ 10 mg  Beef liver, 3 oz (85 g)/ 5.5 mg  Oysters, 3 oz (85 g)/ 8  mg  Beef, 3 oz (85 g)/ 2 to 3 mg  Shrimp, 3 oz (85 g)/ 2.8 mg  [place], 3 oz (85 g)/ 2 mg  Chicken, 3 oz (85 g) / 1 mg  Fish (tuna, halibut), 3 oz (85 g)/ 1 mg  Pork, 3 oz (85 g)/ 0.9 mg Nonheme Iron Sources Food / Iron (mg)  Ready-to-eat breakfast cereal, iron-fortified / 3.9 to 7 mg  Tofu,  cup / 3.4 mg  Kidney beans,  cup / 2.6 mg  Baked potato with skin / 2.7 mg  Asparagus,  cup / 2.2 mg  Avocado / 2 mg  Dried peaches,  cup / 1.6 mg  Raisins,  cup / 1.5 mg  Soy milk, 1 cup / 1.5 mg  Whole-wheat bread, 1 slice / 1.2 mg  Spinach, 1 cup / 0.8 mg  Broccoli,  cup / 0.6 mg IRON ABSORPTION Certain foods can decrease the body's absorption of iron. Try to avoid these foods and beverages while eating meals with iron-containing foods:  Coffee.  Tea.  Fiber.  Soy. Foods containing vitamin C can  help increase the amount of iron your body absorbs from iron sources, especially from nonheme sources. Eat foods with vitamin C along with iron-containing foods to increase your iron absorption. Foods that are high in vitamin C include many fruits and vegetables. Some good sources are:  Fresh orange juice.  Oranges.  Strawberries.  Mangoes.  Grapefruit.  Red bell peppers.  Green bell peppers.  Broccoli.  Potatoes with skin.  Tomato juice. Document Released: 01/17/2005 Document Revised: 08/28/2011 Document Reviewed: 11/24/2010 [facility] Patient Information 2015 [place], [place]. This information is not intended to replace advice given to you by your health care provider. Make sure you discuss any questions you have with your health care provider. Contraception Choices Contraception (birth control) is the use of any methods or devices to prevent pregnancy. Below are some methods to help avoid pregnancy. HORMONAL METHODS   Contraceptive implant. This is a thin, plastic tube containing progesterone hormone. It does not contain estrogen hormone. Your health care provider inserts the tube in the inner part of the upper arm. The tube can remain in place for up to 3 years. After 3 years, the implant must be removed. The implant prevents the ovaries from releasing an egg (ovulation), thickens the cervical mucus to prevent sperm from entering the uterus, and thins the lining of the inside of the uterus.  Progesterone-only injections. These injections are given every 3 months by your health care provider to prevent pregnancy. This synthetic progesterone hormone stops the ovaries from releasing eggs. It also thickens cervical mucus and changes the uterine lining. This makes it harder for sperm to survive in the uterus.  Birth control pills. These pills contain estrogen and progesterone hormone. They work by preventing the ovaries from releasing eggs (ovulation). They also cause the cervical mucus to  thicken, preventing the sperm from entering the uterus. Birth control pills are prescribed by a health care provider.Birth control pills can also be used to treat heavy periods.  Minipill. This type of birth control pill contains only the progesterone hormone. They are taken every day of each month and must be prescribed by your health care provider.  Birth control patch. The patch contains hormones similar to those in birth control pills. It must be changed once a week and is prescribed by a health care provider.  Vaginal ring. The ring contains hormones similar to those in birth control pills. It is  left in the vagina for 3 weeks, removed for 1 week, and then a new one is put back in place. The patient must be comfortable inserting and removing the ring from the vagina.A health care provider's prescription is necessary.  Emergency contraception. Emergency contraceptives prevent pregnancy after unprotected sexual intercourse. This pill can be taken right after sex or up to 5 days after unprotected sex. It is most effective the sooner you take the pills after having sexual intercourse. Most emergency contraceptive pills are available without a prescription. Check with your pharmacist. Do not use emergency contraception as your only form of birth control. BARRIER METHODS   Female condom. This is a thin sheath (latex or rubber) that is worn over the penis during sexual intercourse. It can be used with spermicide to increase effectiveness.  Female condom. This is a soft, loose-fitting sheath that is put into the vagina before sexual intercourse.  Diaphragm. This is a soft, latex, dome-shaped barrier that must be fitted by a health care provider. It is inserted into the vagina, along with a spermicidal jelly. It is inserted before intercourse. The diaphragm should be left in the vagina for 6 to 8 hours after intercourse.  Cervical cap. This is a round, soft, latex or plastic cup that fits over the  cervix and must be fitted by a health care provider. The cap can be left in place for up to 48 hours after intercourse.  Sponge. This is a soft, circular piece of polyurethane foam. The sponge has spermicide in it. It is inserted into the vagina after wetting it and before sexual intercourse.  Spermicides. These are chemicals that kill or block sperm from entering the cervix and uterus. They come in the form of creams, jellies, suppositories, foam, or tablets. They do not require a prescription. They are inserted into the vagina with an applicator before having sexual intercourse. The process must be repeated every time you have sexual intercourse. INTRAUTERINE CONTRACEPTION  Intrauterine device (IUD). This is a T-shaped device that is put in a woman's uterus during a menstrual period to prevent pregnancy. There are 2 types:  Copper IUD. This type of IUD is wrapped in copper wire and is placed inside the uterus. Copper makes the uterus and fallopian tubes produce a fluid that kills sperm. It can stay in place for 10 years.  Hormone IUD. This type of IUD contains the hormone progestin (synthetic progesterone). The hormone thickens the cervical mucus and prevents sperm from entering the uterus, and it also thins the uterine lining to prevent implantation of a fertilized egg. The hormone can weaken or kill the sperm that get into the uterus. It can stay in place for 3-5 years, depending on which type of IUD is used. PERMANENT METHODS OF CONTRACEPTION  Female tubal ligation. This is when the woman's fallopian tubes are surgically sealed, tied, or blocked to prevent the egg from traveling to the uterus.  Hysteroscopic sterilization. This involves placing a small coil or insert into each fallopian tube. Your doctor uses a technique called hysteroscopy to do the procedure. The device causes scar tissue to form. This results in permanent blockage of the fallopian tubes, so the sperm cannot fertilize the egg.  It takes about 3 months after the procedure for the tubes to become blocked. You must use another form of birth control for these 3 months.  Female sterilization. This is when the female has the tubes that carry sperm tied off (vasectomy).This blocks sperm from entering the vagina  during sexual intercourse. After the procedure, the man can still ejaculate fluid (semen). NATURAL PLANNING METHODS  Natural family planning. This is not having sexual intercourse or using a barrier method (condom, diaphragm, cervical cap) on days the woman could become pregnant.  Calendar method. This is keeping track of the length of each menstrual cycle and identifying when you are fertile.  Ovulation method. This is avoiding sexual intercourse during ovulation.  Symptothermal method. This is avoiding sexual intercourse during ovulation, using a thermometer and ovulation symptoms.  Post-ovulation method. This is timing sexual intercourse after you have ovulated. Regardless of which type or method of contraception you choose, it is important that you use condoms to protect against the transmission of sexually transmitted infections (STIs). Talk with your health care provider about which form of contraception is most appropriate for you. Document Released: 06/05/2005 Document Revised: 06/10/2013 Document Reviewed: 11/28/2012 [facility] Patient Information 2015 [place], [place]. This information is not intended to replace advice given to you by your health care provider. Make sure you discuss any questions you have with your health care provider.

## 2014-07-02 NOTE — Lactation Note (Addendum)
This note was copied from the chart of [facility]. Lactation Consultation Note  Patient Name: Morgan Fleming Date: 07/02/2014 Reason for consult: Follow-up assessment       Mom had pumped 30 mls of transitional milk from her breast 30 minutes ago. The lump under her arm is a little softer, heat applied to that area. Mom is presently applyinjg ice to both breasts. Her brest are very warm, and somewhat engorged on the lateral side of each breast . Breast care briefly reviewed again. Mom knos to call for question/concerns.    Maternal Data    Feeding Feeding Type: Breast Fed Length of feed:  (latached when I walked in the room, and when I left)  LATCH Score/Interventions Latch: Grasps breast easily, tongue down, lips flanged, rhythmical sucking.  Audible Swallowing: Spontaneous and intermittent  Type of Nipple: Everted at rest and after stimulation  Comfort (Breast/Nipple): Filling, red/small blisters or bruises, mild/mod discomfort Problem noted: Engorgment Intervention(s): Ice;Hand expression;Reverse pressure  Problem noted: Mild/Moderate discomfort Interventions (Mild/moderate discomfort): Post-pump Interventions (Severe discomfort): Double electric pum  Hold (Positioning): No assistance needed to correctly position infant at breast. Intervention(s): Breastfeeding basics reviewed;Support Pillows;Position options;Skin to skin  LATCH Score: 9  Lactation Tools Discussed/Used     Consult Status Consult Status: Complete Date: 07/03/14 Follow-up type: Call as needed    Morgan Fleming 07/02/2014, 10:02 AM

## 2014-07-06 ENCOUNTER — Other Ambulatory Visit: Payer: Self-pay | Admitting: Obstetrics & Gynecology

## 2014-07-06 DIAGNOSIS — E049 Nontoxic goiter, unspecified: Secondary | ICD-10-CM

## 2014-07-14 ENCOUNTER — Ambulatory Visit
Admission: RE | Admit: 2014-07-14 | Discharge: 2014-07-14 | Disposition: A | Discharge status: Home / Self Care | Payer: BC Managed Care – PPO | Source: Ambulatory Visit | Attending: Obstetrics & Gynecology | Admitting: Obstetrics & Gynecology

## 2014-07-14 DIAGNOSIS — E049 Nontoxic goiter, unspecified: Secondary | ICD-10-CM

## 2014-08-11 ENCOUNTER — Encounter: Payer: Self-pay | Admitting: Internal Medicine

## 2014-08-11 ENCOUNTER — Ambulatory Visit (INDEPENDENT_AMBULATORY_CARE_PROVIDER_SITE_OTHER): Payer: BC Managed Care – PPO | Admitting: Internal Medicine

## 2014-08-11 VITALS — BP 118/70 | HR 61 | Temp 98.3°F | Resp 12 | Ht 63.0 in | Wt 186.0 lb

## 2014-08-11 DIAGNOSIS — E041 Nontoxic single thyroid nodule: Secondary | ICD-10-CM

## 2014-08-11 NOTE — Progress Notes (Addendum)
Patient ID: Morgan Fleming, female   DOB: August 16, 1980, 34 y.o.   MRN: [id number]   HPI  Morgan Fleming is a 34 y.o.-year-old female, referred by her ObGyn, Dr. Ena Dawley, for management of MNG. Patient is here accompanied by her husband, Morgan Fleming as part of the history, and her 39 week old son.  She describes that at the beginning of January she noticed the nodule in the mirror and saw her OB/GYN doctor who ordered a thyroid ultrasound.  Thyroid U/S (07/14/2014): 3.2 x 2.2 x 3.5 cm R thyroid nodule  I reviewed pt's thyroid tests - normal: 07/28/2014: TSH 0.916   Pt. does feel her right nodule in the anterior lower neck,  but denies neck compression symptoms: hoarseness, dysphagia/odynophagia, SOB with lying down.  Pt  denies:  - heat intolerance/cold intolerance - tremors - palpitations - anxiety/depression - hyperdefecation/constipation - dry skin - hair loss - fatigue  She has SOB and wheezing (asthma).   Pt does have a FH of thyroid ds.: mother with thyroid nodule. No FH of thyroid cancer. No h/o radiation tx to head or neck.  No seaweed or kelp, no recent contrast studies. No steroid use. No herbal supplements.   ROS: Constitutional: + Both weight gain/loss - with and after pregnancy, no fatigue, no subjective hyperthermia/hypothermia Eyes: no blurry vision, no xerophthalmia ENT: no sore throat, no nodules palpated in throat, no dysphagia/odynophagia, no hoarseness Cardiovascular: no CP/SOB/palpitations/leg swelling Respiratory: no cough/+ SOB and wheezing Gastrointestinal: no N/V/D/C Musculoskeletal: no muscle/joint aches Skin: no rashes, + stretch marks  Neurological: no tremors/numbness/tingling/dizziness Psychiatric: no depression/anxiety  Past Medical History  Diagnosis Date  . Anemia   . Asthma     daily and prn inhaler  . Breast feeding status of mother     instructed to pump and discard x 24 hrs. post-op  . Umbilical hernia    Past Surgical  History  Procedure Laterality Date  . Skin tag removal  1996    right ear  . Dilation and curettage of uterus  03/19/2009    suction D & C, polypectomy  . Vacuum assisted vaginal delivery  10/06/2010    repair of second degree midline lac.  Marland Kitchen Umbilical hernia repair  05/29/2011    Procedure: HERNIA REPAIR UMBILICAL ADULT;  Surgeon: Belva Crome, MD;  Location: [facility];  Service: General;  Laterality: N/A;  Umbilical Hernia repair with mesh patch  . Cesarean section N/A 06/29/2014    Procedure: CESAREAN SECTION;  Surgeon: Crawford Givens, MD;  Location: [facility];  Service: Obstetrics;  Laterality: N/A;   History   Social History  . Marital Status: Married    Spouse Name: N/A  . Number of Children: 2   Occupational History  .  school counselor    Social History Main Topics  . Smoking status: Never Smoker   . Smokeless tobacco: Never Used  . Alcohol Use: No  . Drug Use: No  . Sexual Activity: Yes   Current Outpatient Prescriptions on File Prior to Visit  Medication Sig Dispense Refill  . ferrous sulfate 325 (65 FE) MG tablet Take 1 tablet (325 mg total) by mouth 2 (two) times daily with a meal. 30 tablet 3  . ibuprofen (ADVIL,MOTRIN) 600 MG tablet Take 1 tablet (600 mg total) by mouth every 6 (six) hours as needed for mild pain. 30 tablet 0  . ondansetron (ZOFRAN) 4 MG tablet Take 1 tablet (4 mg total) by mouth every 4 (four) hours as  needed for nausea. (Patient not taking: Reported on 08/11/2014) 20 tablet 0  . ondansetron (ZOFRAN) 4 MG/2ML SOLN injection Inject 2 mLs (4 mg total) into the vein every 8 (eight) hours as needed for nausea. (Patient not taking: Reported on 08/11/2014) 2 mL 0  . oxyCODONE-acetaminophen (PERCOCET/ROXICET) 5-325 MG per tablet Take 1-2 tablets by mouth every 6 (six) hours as needed (for pain scale equal to or greater than 7). (Patient not taking: Reported on 08/11/2014) 30 tablet 0  . senna-docusate (SENOKOT-S) 8.6-50 MG per tablet Take 2  tablets by mouth at bedtime. (Patient not taking: Reported on 08/11/2014) 10 tablet 0  . simethicone (MYLICON) 80 MG chewable tablet Chew 1 tablet (80 mg total) by mouth 2 (two) times daily after a meal. (Patient not taking: Reported on 08/11/2014) 10 tablet 0   No current facility-administered medications on file prior to visit.   Allergies  Allergen Reactions  . Mometasone Furoate Swelling    Only lips and nose.  . Montelukast Sodium Nausea And Vomiting   No family history on file.  PE: BP 118/70 mmHg  Pulse 61  Temp(Src) 98.3 F (36.8 C) (Oral)  Resp 12  Ht 5\' 3"  (1.6 m)  Wt 186 lb (84.369 kg)  BMI 32.96 kg/m2  SpO2 97% Wt Readings from Last 3 Encounters:  08/11/14 186 lb (84.369 kg)  06/29/14 210 lb (95.255 kg)  03/04/12 172 lb (78.019 kg)   Constitutional: overweight, in NAD Eyes: PERRLA, EOMI, no exophthalmos ENT: moist mucous membranes, right thyroid mass easily palpated in neck, no cervical lymphadenopathy Cardiovascular: RRR, No MRG Respiratory: CTA B Gastrointestinal: abdomen soft, NT, ND, BS+ Musculoskeletal: no deformities, strength intact in all 4;  Skin: moist, warm, no rashes Neurological: no tremor with outstretched hands, DTR normal in all 4  ASSESSMENT: 1. MNG - thyroid U/S (07/14/2014):  Right thyroid lobe : 56 x 26 x 35 mm. Dominant 32 x 22 x 35 mm nodule, inferior pole. 4 x 3 x 5 mm hypoechoic nodule, superior pole.  Left thyroid lobe: 45 x 14 x 13 mm. Three small cysts, all less than 5 mm maximum diameter.  Isthmus Thickness: 5 mm. No nodules visualized.  Lymphadenopathy: None visualized.  PLAN: 1. MNG  - I reviewed the images of her thyroid ultrasound along with the patient and her husband. I pointed out that most of her thyroid nodules are very small and appear as lakes of colloid. These are not worrisome. The R dominant nodule is large, has some internal blood flow, and is taller than wide - these being risk factors for cancer. Otherwise,  the nodule is hyperechoic, without calcifications, and well delimited from surrounding tissue. Pt does not have a thyroid cancer family history or a personal history of RxTx to head/neck. All these would favor benignity.  - the only way that we can tell exactly if it is cancer or not is by doing a thyroid biopsy (FNA). I explained what the test entails.  - I explained that this is not cancer, we can continue to follow her on a yearly basis, and check another ultrasound in another year or 2. In this case, she should let me know if she develops neck compression symptoms, in that case, we might need to do either lobectomy or thyroidectomy. I did explain that, while thyroid surgery is not a complicated one, it still can have side effects and also she might have a risk of ~25% of becoming hypothyroid after hemithyroidectomy.  - patient decided to  have the FNA done now >> I ordered this.  - We discussed about the possibility of this being thyroid cancer and await would affect her breast-feeding and later fertility. I explained that thyroid cancer is usually very indolent, growing over many years, and not causing a lot of problems. In this case, she would need to have thyroidectomy and we might need to do radioactive iodine treatment after thyroidectomy. She is breast-feeding now and plans to continue until the babies 68-year-old (he is now 21 weeks old) and I explained that in this case, we might need to wait and do remnant ablation after she finishes breast-feeding. Also, if we end up going radioactive iodine treatment, she cannot get pregnant again less than 6 months after the treatment. - We also discussed about different possible results of her FNA (benign, malignant an intermediate). We discussed about further directions for treatment of follow-ups depending on each of these results. - I'll see her back in a year, assuming her FNA is normal. If FNA abnormal, we will meet sooner.  - I advised pt to join my chart  and I will send her the results through there   I will addend the result of the biopsy as soon as it becomes available.  Adequacy Reason Satisfactory For Evaluation. Diagnosis THYROID, RIGHT, FINE NEEDLE ASPIRATION (SPECIMEN 1 OF 1, COLLECTED ON 08/12/2014): ATYPIA OF UNDETERMINED SIGNIFICANCE / FOLLICULAR LESION OF UNDETERMINED SIGNIFICANCE (BETHESDA CATEGORY III). SEE COMMENT. Willeen Niece MD Pathologist, Electronic Signature (Case signed 08/13/2014) Specimen Clinical Information Dominant 32 x 22 x 38mm nodule, inferior pole right lobe thyroid Source Thyroid, Fine Needle Aspiration, Right, (Specimen 1 of 1, collected on 08/12/2014)  The Bethesda recommendation for FLUS is to repeat the biopsy in 6 months from previous. I explained that the majority of these nodules are not malignant. I d/w pt about repeating the Bx and she will talk with her husband and let me know their decision.  ----- Message -----   FromHarrington Fleming   Sent: 08/16/2014 10:43 AM EST    To: Philemon Kingdom, MD  Subject: Visit Follow-Up Question   After talking my biopsy results over with my husband, we would like to be referred for surgery to remove the (non-toxic) thyroid nodule. As a wife and mother of two young children, I can't imagine waiting 3-6 more months worrying to repeat the biopsy and await (possibly) the same results. We desire to have more children in the future and we don't want to jeopardize the active role the thyroid plays in that process. Please advise of your/our next steps.   Thank you.   Will go ahead and refer her to Sx.

## 2014-08-11 NOTE — Patient Instructions (Signed)
I ordered the thyroid Biopsy for the R nodule.  Please return in 1 year.  Thyroid Biopsy The thyroid gland is a butterfly-shaped gland situated in the front of the neck. It produces hormones which affect metabolism, growth and development, and body temperature. A thyroid biopsy is a procedure in which small samples of tissue or fluid are removed from the thyroid gland or mass and examined under a microscope. This test is done to determine the cause of thyroid problems, such as infection, cancer, or other thyroid problems. There are 2 ways to obtain samples: 1. Fine needle biopsy. Samples are removed using a thin needle inserted through the skin and into the thyroid gland or mass. 2. Open biopsy. Samples are removed after a cut (incision) is made through the skin. LET YOUR CAREGIVER KNOW ABOUT:   Allergies.  Medications taken including herbs, eye drops, over-the-counter medications, and creams.  Use of steroids (by mouth or creams).  Previous problems with anesthetics or numbing medicine.  Possibility of pregnancy, if this applies.  History of blood clots (thrombophlebitis).  History of bleeding or blood problems.  Previous surgery.  Other health problems. RISKS AND COMPLICATIONS  Bleeding from the site. The risk of bleeding is higher if you have a bleeding disorder or are taking any blood thinning medications (anticoagulants).  Infection.  Injury to structures near the thyroid gland. BEFORE THE PROCEDURE  This is a procedure that can be done as an outpatient. Confirm the time that you need to arrive for your procedure. Confirm whether there is a need to fast or withhold any medications. A blood sample may be done to determine your blood clotting time. Medicine may be given to help you relax (sedative). PROCEDURE Fine needle biopsy. You will be awake during the procedure. You may be asked to lie on your back with your head tipped backward to extend your neck. Let your caregiver  know if you cannot tolerate the positioning. An area on your neck will be cleansed. A needle is inserted through the skin of your neck. You may feel a mild discomfort during this procedure. You may be asked to avoid coughing, talking, swallowing, or making sounds during some portions of the procedure. The needle is withdrawn once tissue or fluid samples have been removed. Pressure may be applied to the neck to reduce swelling and ensure that bleeding has stopped. The samples will be sent for examination.  Open biopsy. You will be given general anesthesia. You will be asleep during the procedure. An incision is made in your neck. A sample of thyroid tissue or the mass is removed. The tissue sample or mass will be sent for examination. The sample or mass may be examined during the biopsy. If the sample or mass contains cancer cells, some or all of the thyroid gland may be removed. The incision is closed with stitches. AFTER THE PROCEDURE  Your recovery will be assessed and monitored. If there are no problems, as an outpatient, you should be able to go home shortly after the procedure. If you had a fine needle biopsy:  You may have soreness at the biopsy site for 1 to 2 days. If you had an open biopsy:   You may have soreness at the biopsy site for 3 to 4 days.  You may have a hoarse voice or sore throat for 1 to 2 days. Obtaining the Test Results It is your responsibility to obtain your test results. Do not assume everything is normal if you have not  heard from your caregiver or the medical facility. It is important for you to follow up on all of your test results. HOME CARE INSTRUCTIONS   Keeping your head raised on a pillow when you are lying down may ease biopsy site discomfort.  Supporting the back of your head and neck with both hands as you sit up from a lying position may ease biopsy site discomfort.  Only take over-the-counter or prescription medicines for pain, discomfort, or fever as  directed by your caregiver.  Throat lozenges or gargling with warm salt water may help to soothe a sore throat. SEEK IMMEDIATE MEDICAL CARE IF:   You have severe bleeding from the biopsy site.  You have difficulty swallowing.  You have a fever.  You have increased pain, swelling, redness, or warmth at the biopsy site.  You notice pus coming from the biopsy site.  You have swollen glands (lymph nodes) in your neck. Document Released: 04/02/2007 Document Revised: 09/30/2012 Document Reviewed: 08/28/2013 [facility] Patient Information 2015 [employer], [place]. This information is not intended to replace advice given to you by your health care provider. Make sure you discuss any questions you have with your health care provider.

## 2014-08-12 ENCOUNTER — Ambulatory Visit
Admission: RE | Admit: 2014-08-12 | Discharge: 2014-08-12 | Disposition: A | Discharge status: Home / Self Care | Payer: BC Managed Care – PPO | Source: Ambulatory Visit | Attending: Internal Medicine | Admitting: Internal Medicine

## 2014-08-12 ENCOUNTER — Other Ambulatory Visit (HOSPITAL_COMMUNITY)
Admission: RE | Admit: 2014-08-12 | Discharge: 2014-08-12 | Disposition: A | Discharge status: Home / Self Care | Payer: BC Managed Care – PPO | Source: Ambulatory Visit | Attending: Diagnostic Radiology | Admitting: Diagnostic Radiology

## 2014-08-12 DIAGNOSIS — E041 Nontoxic single thyroid nodule: Secondary | ICD-10-CM | POA: Insufficient documentation

## 2014-08-14 ENCOUNTER — Encounter: Payer: Self-pay | Admitting: Internal Medicine

## 2014-08-16 ENCOUNTER — Encounter: Payer: Self-pay | Admitting: Internal Medicine

## 2014-08-17 NOTE — Addendum Note (Signed)
Addended by: Philemon Kingdom on: 08/17/2014 08:56 AM   Modules accepted: Orders, Level of Service

## 2014-09-04 ENCOUNTER — Ambulatory Visit: Payer: Self-pay | Admitting: Surgery

## 2014-09-04 ENCOUNTER — Other Ambulatory Visit: Payer: Self-pay | Admitting: Surgery

## 2014-09-08 NOTE — Patient Instructions (Addendum)
Morgan Fleming  09/08/2014   Your procedure is scheduled on: 09/09/13   Report to [facility] Main  Entrance and follow signs to               Morgan Fleming at 7:15 AM.   Call this number if you have problems the morning of surgery [phone]   Remember:  Do not eat food or drink liquids :After Midnight.     Take these medicines the morning of surgery with A SIP OF WATER: USE ADVAIR             STOP ASPIRIN AND ALL HERBAL MEDS 5 DAYS PREOP                               You may not have any metal on your body including hair pins and              piercings  Do not wear jewelry, make-up, lotions, powders or perfumes.             Do not wear nail polish.  Do not shave  48 hours prior to surgery.              Men may shave face and neck.   Do not bring valuables to the [facility]. Morgan Fleming.  Contacts, dentures or bridgework may not be worn into surgery.  Leave suitcase in the car. After surgery it may be brought to your room.     Patients discharged the day of surgery will not be allowed to drive home.  Name and phone number of your driver:  Special Instructions: N/A              Please read over the following fact sheets you were given: _____________________________________________________________________                                                     Morgan Fleming  Before surgery, you can play an important role.  Because skin is not sterile, your skin needs to be as free of germs as possible.  You can reduce the number of germs on your skin by washing with CHG (chlorahexidine gluconate) soap before surgery.  CHG is an antiseptic cleaner which kills germs and bonds with the skin to continue killing germs even after washing. Please DO NOT use if you have an allergy to CHG or antibacterial soaps.  If your skin becomes reddened/irritated stop using the CHG and inform your nurse  when you arrive at Short Stay. Do not shave (including legs and underarms) for at least 48 hours prior to the first CHG shower.  You may shave your face. Please follow these instructions carefully:   1.  Shower with CHG Soap the night before surgery and the  morning of Surgery.   2.  If you choose to wash your hair, wash your hair first as usual with your  normal  Shampoo.   3.  After you shampoo, rinse your hair and body thoroughly to remove the  shampoo.  4.  Use CHG as you would any other liquid soap.  You can apply chg directly  to the skin and wash . Gently wash with scrungie or clean wascloth    5.  Apply the CHG Soap to your body ONLY FROM THE NECK DOWN.   Do not use on open                           Wound or open sores. Avoid contact with eyes, ears mouth and genitals (private parts).                        Genitals (private parts) with your normal soap.              6.  Wash thoroughly, paying special attention to the area where your surgery  will be performed.   7.  Thoroughly rinse your body with warm water from the neck down.   8.  DO NOT shower/wash with your normal soap after using and rinsing off  the CHG Soap .                9.  Pat yourself dry with a clean towel.             10.  Wear clean pajamas.             11.  Place clean sheets on your bed the night of your first shower and do not  sleep with pets.  Day of Surgery : Do not apply any lotions/deodorants the morning of surgery.  Please wear clean clothes to the [facility]/surgery center.  FAILURE TO FOLLOW THESE INSTRUCTIONS MAY RESULT IN THE CANCELLATION OF YOUR SURGERY    PATIENT SIGNATURE_________________________________  ______________________________________________________________________

## 2014-09-09 ENCOUNTER — Encounter (HOSPITAL_COMMUNITY): Payer: Self-pay | Admitting: Surgery

## 2014-09-09 ENCOUNTER — Ambulatory Visit (HOSPITAL_COMMUNITY)
Admission: RE | Admit: 2014-09-09 | Discharge: 2014-09-09 | Disposition: A | Discharge status: Home / Self Care | Payer: BC Managed Care – PPO | Source: Ambulatory Visit | Attending: Anesthesiology | Admitting: Anesthesiology

## 2014-09-09 ENCOUNTER — Encounter (HOSPITAL_COMMUNITY): Payer: Self-pay

## 2014-09-09 ENCOUNTER — Encounter (HOSPITAL_COMMUNITY)
Admission: RE | Admit: 2014-09-09 | Discharge: 2014-09-09 | Disposition: A | Discharge status: Home / Self Care | Payer: BC Managed Care – PPO | Source: Ambulatory Visit | Attending: Surgery | Admitting: Surgery

## 2014-09-09 DIAGNOSIS — Z888 Allergy status to other drugs, medicaments and biological substances status: Secondary | ICD-10-CM | POA: Diagnosis not present

## 2014-09-09 DIAGNOSIS — Z9889 Other specified postprocedural states: Secondary | ICD-10-CM

## 2014-09-09 DIAGNOSIS — Z9089 Acquired absence of other organs: Secondary | ICD-10-CM | POA: Insufficient documentation

## 2014-09-09 DIAGNOSIS — J45909 Unspecified asthma, uncomplicated: Secondary | ICD-10-CM

## 2014-09-09 DIAGNOSIS — E041 Nontoxic single thyroid nodule: Secondary | ICD-10-CM | POA: Diagnosis present

## 2014-09-09 DIAGNOSIS — C73 Malignant neoplasm of thyroid gland: Secondary | ICD-10-CM | POA: Diagnosis not present

## 2014-09-09 DIAGNOSIS — E890 Postprocedural hypothyroidism: Secondary | ICD-10-CM

## 2014-09-09 DIAGNOSIS — F1099 Alcohol use, unspecified with unspecified alcohol-induced disorder: Secondary | ICD-10-CM | POA: Diagnosis not present

## 2014-09-09 HISTORY — DX: Neoplasm of unspecified behavior of endocrine glands and other parts of nervous system: D49.7

## 2014-09-09 LAB — BASIC METABOLIC PANEL
Anion gap: 8 (ref 5–15)
BUN: 18 mg/dL (ref 6–23)
CALCIUM: 9.1 mg/dL (ref 8.4–10.5)
CO2: 25 mmol/L (ref 19–32)
Chloride: 109 mmol/L (ref 96–112)
Creatinine, Ser: 0.7 mg/dL (ref 0.50–1.10)
GFR calc Af Amer: >90 mL/min (ref >90)
GLUCOSE: 78 mg/dL (ref 70–99)
Potassium: 4.1 mmol/L (ref 3.5–5.1)
Sodium: 142 mmol/L (ref 135–145)

## 2014-09-09 LAB — CBC
HEMATOCRIT: 41.4 % (ref 36.0–46.0)
Hemoglobin: 13.7 g/dL (ref 12.0–15.0)
MCH: 30.9 pg (ref 26.0–34.0)
MCHC: 33.1 g/dL (ref 30.0–36.0)
MCV: 93.2 fL (ref 78.0–100.0)
PLATELETS: 286 10*3/uL (ref 150–400)
RBC: 4.44 MIL/uL (ref 3.87–5.11)
RDW: 12.8 % (ref 11.5–15.5)
WBC: 7.9 10*3/uL (ref 4.0–10.5)

## 2014-09-09 LAB — HCG, SERUM, QUALITATIVE: PREG SERUM: NEGATIVE

## 2014-09-09 NOTE — H&P (Signed)
General Surgery [facility]  Morgan Fleming 09/04/2014 4:21 PM Patient #: [id number] DOB: [birth year] Married / Language: English / Race: Black or African American Female  History of Present Illness Morgan Regal MD; 09/04/2014 5:17 PM)  The patient is a 34 year old female who presents with a thyroid nodule. Patient is referred by Dr. 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 Patient noted on self-examination a mass in the right lobe of the thyroid. She was seen by her gynecologist and sent for ultrasound. This was performed on July 14, 2014. It showed a dominant 3.5 cm mass in the right thyroid lobe. Left thyroid lobe was normal. Subsequent fine-needle aspiration biopsy was performed on August 12, 2014. 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 Patient has no prior history of thyroid disease. She has never been on thyroid medication. She has had no prior head or neck surgery. There is a family history of thyroid nodules and the patient's mother who underwent thyroid lobectomy. There is no history of malignancy. There is no family history of other endocrine neoplasms. Patient denies any compressive symptoms. She denies tremor. She denies palpitations.   Other Problems Morgan Fleming, CMA; 09/04/2014 3:01 PM) Asthma Umbilical Hernia Repair  Past Surgical History Morgan Fleming, CMA; 09/04/2014 3:51 PM) Cesarean Section - 1  Diagnostic Studies History Morgan Fleming, CMA; 09/04/2014 3:51 PM) Colonoscopy never Mammogram never Pap Smear 1-5 years ago  Allergies Morgan Fleming, CMA; 09/04/2014 3:54 PM) Singulair *ANTIASTHMATIC AND BRONCHODILATOR AGENTS* Nasonex *NASAL AGENTS - SYSTEMIC AND TOPICAL*  Medication History Morgan Fleming, CMA; 09/04/2014 3:54 PM) Advair Diskus (100-50MCG/DOSE Aero Pow Br Act, Inhalation) Active. ProAir HFA (108 (90  Base)MCG/ACT Aerosol Soln, Inhalation) Active. Medications Reconciled  Social History Morgan Fleming, CMA; 09/04/2014 3:51 PM) Alcohol use Occasional alcohol use. Caffeine use Coffee, Tea. No drug use Tobacco use Never smoker.  Family History Morgan Fleming, [place]; 09/04/2014 3:51 PM) Arthritis Father, Mother. Diabetes Mellitus Family Members In General. Hypertension Father, Mother. Respiratory Condition Family Members In General. Seizure disorder Family Members In General. Thyroid problems Mother.  Pregnancy / Birth History Morgan Fleming, [place]; 09/04/2014 3:51 PM) Gravida 4 Maternal age 65-30 Para 2 Regular periods  Review of Systems ([place]; 09/04/2014 3:51 PM) General Not Present- Appetite Loss, Chills, Fatigue, Fever, Night Sweats, Weight Gain and Weight Loss. Skin Not Present- Change in Wart/Mole, Dryness, Hives, Jaundice, New Lesions, Non-Healing Wounds, Rash and Ulcer. HEENT Present- Seasonal Allergies. Not Present- Earache, Hearing Loss, Hoarseness, Nose Bleed, Oral Ulcers, Ringing in the Ears, Sinus Pain, Sore Throat, Visual Disturbances, Wears glasses/contact lenses and Yellow Eyes. Respiratory Not Present- Bloody sputum, Chronic Cough, Difficulty Breathing, Snoring and Wheezing. Breast Not Present- Breast Mass, Breast Pain, Nipple Discharge and Skin Changes. Cardiovascular Not Present- Chest Pain, Difficulty Breathing Lying Down, Leg Cramps, Palpitations, Rapid Heart Rate, Shortness of Breath and Swelling of Extremities. Gastrointestinal Not Present- Abdominal Pain, Bloating, Bloody Stool, Change in Bowel Habits, Chronic diarrhea, Constipation, Difficulty Swallowing, Excessive gas, Gets full quickly at meals, Hemorrhoids, Indigestion, Nausea, Rectal Pain and Vomiting. Female Genitourinary Not Present- Frequency, Nocturia, Painful Urination, Pelvic Pain and Urgency. Musculoskeletal Not Present- Back Pain, Joint Pain, Joint Stiffness, Muscle Pain, Muscle Weakness  and Swelling of Extremities. Neurological Not Present- Decreased Memory, Fainting, Headaches, Numbness, Seizures, Tingling, Tremor, Trouble walking and Weakness. Psychiatric Not Present- Anxiety, Bipolar, Change in Sleep Pattern, Depression, Fearful and Frequent crying. Endocrine Not Present- Cold Intolerance,  Excessive Hunger, Hair Changes, Heat Intolerance, Hot flashes and New Diabetes. Hematology Present- Easy Bruising. Not Present- Excessive bleeding, Gland problems, HIV and Persistent Infections.   Vitals (Morgan Fleming CMA; 09/04/2014 3:53 PM) 09/04/2014 3:52 PM Weight: 189 lb Height: 63in Body Surface Area: 1.95 m Body Mass Index: 33.48 kg/m Temp.: 97.25F(Temporal)  Pulse: 73 (Regular)  BP: 126/77 (Sitting, Left Arm, Standard)   Physical Exam Morgan Regal MD; 09/04/2014 5:18 PM)  General - appears comfortable, no distress; not diaphorectic  HEENT - normocephalic; sclerae clear, gaze conjugate; mucous membranes moist, dentition good; voice normal  Neck - asymmetric on extension; no palpable anterior or posterior cervical adenopathy; dominant palpable mass right thyroid lobe, approximately 3-4 cm, quite firm, mobile, smooth, nontender; left thyroid lobe without palpable abnormality  Chest - clear bilaterally with rhonchi, rales, or wheeze  Cor - regular rhythm with normal rate; no significant murmur  Ext - non-tender without significant edema or lymphedema  Neuro - grossly intact; no tremor    Assessment & Plan Morgan Regal MD; 09/04/2014 5:20 PM)  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 I have provided she and her husband with written literature to review at home.  After a thorough discussion, I have recommended right thyroid lobectomy for definitive diagnosis. We have discussed risk and benefits of the procedure including  injury to recurrent laryngeal nerve and injury to parathyroid glands. We have discussed the possibility of malignancy with a risk of cancer of 20-25%. In the event of malignancy, she would require completion thyroidectomy. We have discussed the hospital stay, the postoperative recovery, and the return to normal activity. We have discussed possible need for radioactive iodine treatment. We have discussed the possible need for lifelong thyroid hormone replacement. Patient and her husband understand and wish to proceed with surgery in the near future.  The risks and benefits of the procedure have been discussed at length with the patient. The patient understands the proposed procedure, potential alternative treatments, and the course of recovery to be expected. All of the patient's questions have been answered at this time. The patient wishes to proceed with surgery.  Morgan Regal, MD, [facility] Office: [phone]

## 2014-09-09 NOTE — Progress Notes (Signed)
PT has been instructed on procedure for her breast feeding routine by MD.

## 2014-09-09 NOTE — Progress Notes (Signed)
Quick Note:  These results are acceptable for scheduled surgery.  Deante Blough M. Sadi Arave, MD, FACS [facility] Office: [phone]   ______ 

## 2014-09-09 NOTE — Progress Notes (Signed)
Quick Note:  Pre-operative chest x-ray is acceptable for scheduled surgery.  Srihari Shellhammer M. Terissa Haffey, MD, FACS [facility] Office: [phone]   ______ 

## 2014-09-10 ENCOUNTER — Observation Stay (HOSPITAL_COMMUNITY)
Admission: RE | Admit: 2014-09-10 | Discharge: 2014-09-11 | Disposition: A | Discharge status: Home / Self Care | Payer: BC Managed Care – PPO | Source: Ambulatory Visit | Attending: Surgery | Admitting: Surgery

## 2014-09-10 ENCOUNTER — Encounter (HOSPITAL_COMMUNITY)
Admission: RE | Disposition: A | Discharge status: Home / Self Care | Payer: Self-pay | Source: Ambulatory Visit | Attending: Surgery

## 2014-09-10 ENCOUNTER — Ambulatory Visit (HOSPITAL_COMMUNITY): Payer: BC Managed Care – PPO | Admitting: Anesthesiology

## 2014-09-10 ENCOUNTER — Encounter (HOSPITAL_COMMUNITY): Payer: Self-pay | Admitting: *Deleted

## 2014-09-10 DIAGNOSIS — Z888 Allergy status to other drugs, medicaments and biological substances status: Secondary | ICD-10-CM | POA: Insufficient documentation

## 2014-09-10 DIAGNOSIS — C73 Malignant neoplasm of thyroid gland: Secondary | ICD-10-CM | POA: Diagnosis not present

## 2014-09-10 DIAGNOSIS — J45909 Unspecified asthma, uncomplicated: Secondary | ICD-10-CM | POA: Insufficient documentation

## 2014-09-10 DIAGNOSIS — F1099 Alcohol use, unspecified with unspecified alcohol-induced disorder: Secondary | ICD-10-CM | POA: Insufficient documentation

## 2014-09-10 HISTORY — PX: THYROID LOBECTOMY: SHX420

## 2014-09-10 SURGERY — LOBECTOMY, THYROID
Anesthesia: General | Laterality: Right

## 2014-09-10 MED ORDER — ACETAMINOPHEN 325 MG PO TABS: 650.0000 mg | ORAL_TABLET | ORAL | Status: DC | PRN

## 2014-09-10 MED ORDER — ONDANSETRON HCL 4 MG/2ML IJ SOLN
INTRAMUSCULAR | Status: DC | PRN
  Administered 2014-09-10: 4 mg via INTRAVENOUS

## 2014-09-10 MED ORDER — HYDROMORPHONE HCL 1 MG/ML IJ SOLN
1.0000 mg | INTRAMUSCULAR | Status: DC | PRN
  Administered 2014-09-10: 1 mg via INTRAVENOUS
  Filled 2014-09-10: qty 1

## 2014-09-10 MED ORDER — GLYCOPYRROLATE 0.2 MG/ML IJ SOLN
INTRAMUSCULAR | Status: AC
  Filled 2014-09-10: qty 3

## 2014-09-10 MED ORDER — ONDANSETRON HCL 4 MG/2ML IJ SOLN
4.0000 mg | Freq: Four times a day (QID) | INTRAMUSCULAR | Status: DC | PRN
  Filled 2014-09-10: qty 2

## 2014-09-10 MED ORDER — ALBUTEROL SULFATE (2.5 MG/3ML) 0.083% IN NEBU: 3.0000 mL | INHALATION_SOLUTION | Freq: Four times a day (QID) | RESPIRATORY_TRACT | Status: DC | PRN

## 2014-09-10 MED ORDER — GLYCOPYRROLATE 0.2 MG/ML IJ SOLN
INTRAMUSCULAR | Status: DC | PRN
  Administered 2014-09-10: 0.6 mg via INTRAVENOUS

## 2014-09-10 MED ORDER — KCL IN DEXTROSE-NACL 20-5-0.45 MEQ/L-%-% IV SOLN
INTRAVENOUS | Status: DC
  Administered 2014-09-10: 14:00:00 via INTRAVENOUS
  Filled 2014-09-10 (×2): qty 1000

## 2014-09-10 MED ORDER — ROCURONIUM BROMIDE 100 MG/10ML IV SOLN
INTRAVENOUS | Status: DC | PRN
  Administered 2014-09-10: 10 mg via INTRAVENOUS
  Administered 2014-09-10: 30 mg via INTRAVENOUS

## 2014-09-10 MED ORDER — ROCURONIUM BROMIDE 100 MG/10ML IV SOLN
INTRAVENOUS | Status: AC
  Filled 2014-09-10: qty 1

## 2014-09-10 MED ORDER — DEXAMETHASONE SODIUM PHOSPHATE 10 MG/ML IJ SOLN
INTRAMUSCULAR | Status: DC | PRN
  Administered 2014-09-10: 10 mg via INTRAVENOUS

## 2014-09-10 MED ORDER — PHENYLEPHRINE 40 MCG/ML (10ML) SYRINGE FOR IV PUSH (FOR BLOOD PRESSURE SUPPORT)
PREFILLED_SYRINGE | INTRAVENOUS | Status: AC
  Filled 2014-09-10: qty 10

## 2014-09-10 MED ORDER — HYDROMORPHONE HCL 1 MG/ML IJ SOLN
INTRAMUSCULAR | Status: AC
  Filled 2014-09-10: qty 1

## 2014-09-10 MED ORDER — FENTANYL CITRATE 0.05 MG/ML IJ SOLN
INTRAMUSCULAR | Status: AC
  Filled 2014-09-10: qty 2

## 2014-09-10 MED ORDER — NEOSTIGMINE METHYLSULFATE 10 MG/10ML IV SOLN
INTRAVENOUS | Status: DC | PRN
  Administered 2014-09-10: 4 mg via INTRAVENOUS

## 2014-09-10 MED ORDER — ONDANSETRON HCL 4 MG PO TABS: 4.0000 mg | ORAL_TABLET | Freq: Four times a day (QID) | ORAL | Status: DC | PRN

## 2014-09-10 MED ORDER — NEOSTIGMINE METHYLSULFATE 10 MG/10ML IV SOLN
INTRAVENOUS | Status: AC
  Filled 2014-09-10: qty 1

## 2014-09-10 MED ORDER — MIDAZOLAM HCL 2 MG/2ML IJ SOLN
INTRAMUSCULAR | Status: AC
  Filled 2014-09-10: qty 2

## 2014-09-10 MED ORDER — LIDOCAINE HCL (CARDIAC) 20 MG/ML IV SOLN
INTRAVENOUS | Status: AC
  Filled 2014-09-10: qty 5

## 2014-09-10 MED ORDER — PROPOFOL 10 MG/ML IV BOLUS
INTRAVENOUS | Status: DC | PRN
  Administered 2014-09-10: 150 mg via INTRAVENOUS

## 2014-09-10 MED ORDER — LIDOCAINE HCL (CARDIAC) 20 MG/ML IV SOLN
INTRAVENOUS | Status: DC | PRN
  Administered 2014-09-10: 100 mg via INTRAVENOUS

## 2014-09-10 MED ORDER — PHENYLEPHRINE HCL 10 MG/ML IJ SOLN
INTRAMUSCULAR | Status: DC | PRN
  Administered 2014-09-10: 40 ug via INTRAVENOUS

## 2014-09-10 MED ORDER — DEXAMETHASONE SODIUM PHOSPHATE 10 MG/ML IJ SOLN
INTRAMUSCULAR | Status: AC
  Filled 2014-09-10: qty 1

## 2014-09-10 MED ORDER — IBUPROFEN 200 MG PO TABS
600.0000 mg | ORAL_TABLET | Freq: Three times a day (TID) | ORAL | Status: DC | PRN
  Administered 2014-09-10: 600 mg via ORAL
  Filled 2014-09-10: qty 3

## 2014-09-10 MED ORDER — SUCCINYLCHOLINE CHLORIDE 20 MG/ML IJ SOLN
INTRAMUSCULAR | Status: DC | PRN
  Administered 2014-09-10: 100 mg via INTRAVENOUS

## 2014-09-10 MED ORDER — HYDROCODONE-ACETAMINOPHEN 5-325 MG PO TABS: 1.0000 | ORAL_TABLET | ORAL | Status: DC | PRN

## 2014-09-10 MED ORDER — CEFAZOLIN SODIUM-DEXTROSE 2-3 GM-% IV SOLR
INTRAVENOUS | Status: AC
  Filled 2014-09-10: qty 50

## 2014-09-10 MED ORDER — FENTANYL CITRATE 0.05 MG/ML IJ SOLN
INTRAMUSCULAR | Status: DC | PRN
  Administered 2014-09-10: 50 ug via INTRAVENOUS
  Administered 2014-09-10: 25 ug via INTRAVENOUS
  Administered 2014-09-10: 75 ug via INTRAVENOUS

## 2014-09-10 MED ORDER — FENTANYL CITRATE 0.05 MG/ML IJ SOLN
INTRAMUSCULAR | Status: AC
  Filled 2014-09-10: qty 5

## 2014-09-10 MED ORDER — PROMETHAZINE HCL 25 MG/ML IJ SOLN
12.5000 mg | INTRAMUSCULAR | Status: DC | PRN
  Administered 2014-09-10: 12.5 mg via INTRAVENOUS
  Filled 2014-09-10: qty 1

## 2014-09-10 MED ORDER — CEFAZOLIN SODIUM-DEXTROSE 2-3 GM-% IV SOLR
2.0000 g | INTRAVENOUS | Status: AC
  Administered 2014-09-10: 2 g via INTRAVENOUS

## 2014-09-10 MED ORDER — HYDROMORPHONE HCL 1 MG/ML IJ SOLN
0.2500 mg | INTRAMUSCULAR | Status: DC | PRN
  Administered 2014-09-10 (×2): 0.5 mg via INTRAVENOUS

## 2014-09-10 MED ORDER — LACTATED RINGERS IV SOLN
INTRAVENOUS | Status: DC
  Administered 2014-09-10: 11:00:00 via INTRAVENOUS
  Administered 2014-09-10: 1000 mL via INTRAVENOUS

## 2014-09-10 MED ORDER — PROPOFOL 10 MG/ML IV BOLUS
INTRAVENOUS | Status: AC
  Filled 2014-09-10: qty 20

## 2014-09-10 SURGICAL SUPPLY — 34 items
ATTRACTOMAT 16X20 MAGNETIC DRP (DRAPES) ×2 IMPLANT
BENZOIN TINCTURE PRP APPL 2/3 (GAUZE/BANDAGES/DRESSINGS) IMPLANT
BLADE HEX COATED 2.75 (ELECTRODE) ×2 IMPLANT
BLADE SURG 15 STRL LF DISP TIS (BLADE) ×1 IMPLANT
BLADE SURG 15 STRL SS (BLADE) ×1
CHLORAPREP W/TINT 26ML (MISCELLANEOUS) ×2 IMPLANT
CLIP TI MEDIUM 6 (CLIP) ×4 IMPLANT
CLIP TI WIDE RED SMALL 6 (CLIP) ×4 IMPLANT
DISSECTOR ROUND CHERRY 3/8 STR (MISCELLANEOUS) ×2 IMPLANT
DRAPE LAPAROTOMY T 98X78 PEDS (DRAPES) ×2 IMPLANT
DRESSING SURGICEL FIBRLLR 1X2 (HEMOSTASIS) ×1 IMPLANT
DRSG SURGICEL FIBRILLAR 1X2 (HEMOSTASIS) ×2
ELECT REM PT RETURN 9FT ADLT (ELECTROSURGICAL) ×2
ELECTRODE REM PT RTRN 9FT ADLT (ELECTROSURGICAL) ×1 IMPLANT
GAUZE SPONGE 4X4 12PLY STRL (GAUZE/BANDAGES/DRESSINGS) IMPLANT
GAUZE SPONGE 4X4 16PLY XRAY LF (GAUZE/BANDAGES/DRESSINGS) ×2 IMPLANT
GLOVE SURG ORTHO 8.0 STRL STRW (GLOVE) ×14 IMPLANT
GOWN STRL REUS W/TWL XL LVL3 (GOWN DISPOSABLE) ×8 IMPLANT
KIT BASIN OR (CUSTOM PROCEDURE TRAY) ×2 IMPLANT
LIQUID BAND (GAUZE/BANDAGES/DRESSINGS) ×2 IMPLANT
PACK BASIC VI WITH GOWN DISP (CUSTOM PROCEDURE TRAY) ×2 IMPLANT
PENCIL BUTTON HOLSTER BLD 10FT (ELECTRODE) ×2 IMPLANT
SHEARS HARMONIC 9CM CVD (BLADE) ×2 IMPLANT
STAPLER VISISTAT 35W (STAPLE) IMPLANT
STRIP CLOSURE SKIN 1/2X4 (GAUZE/BANDAGES/DRESSINGS) ×2 IMPLANT
SUT MNCRL AB 4-0 PS2 18 (SUTURE) ×2 IMPLANT
SUT SILK 2 0 (SUTURE) ×1
SUT SILK 2-0 18XBRD TIE 12 (SUTURE) ×1 IMPLANT
SUT SILK 3 0 (SUTURE)
SUT SILK 3-0 18XBRD TIE 12 (SUTURE) IMPLANT
SUT VIC AB 3-0 SH 18 (SUTURE) ×4 IMPLANT
SYR BULB IRRIGATION 50ML (SYRINGE) ×2 IMPLANT
TOWEL OR 17X26 10 PK STRL BLUE (TOWEL DISPOSABLE) ×2 IMPLANT
YANKAUER SUCT BULB TIP 10FT TU (MISCELLANEOUS) ×2 IMPLANT

## 2014-09-10 NOTE — Anesthesia Preprocedure Evaluation (Addendum)
Anesthesia Evaluation  Patient identified by MRN, date of birth, ID band Patient awake    Reviewed: Allergy & Precautions, H&P , NPO status , Patient's Chart, lab work & pertinent test results  Airway Mallampati: II  TM Distance: >3 FB Neck ROM: Full    Dental no notable dental hx. (+) Teeth Intact, Dental Advisory Given   Pulmonary asthma ,  breath sounds clear to auscultation  Pulmonary exam normal       Cardiovascular negative cardio ROS  Rhythm:Regular Rate:Normal     Neuro/Psych negative neurological ROS  negative psych ROS   GI/Hepatic negative GI ROS, Neg liver ROS,   Endo/Other  negative endocrine ROS  Renal/GU negative Renal ROS  negative genitourinary   Musculoskeletal   Abdominal   Peds  Hematology negative hematology ROS (+)   Anesthesia Other Findings   Reproductive/Obstetrics negative OB ROS                            Anesthesia Physical Anesthesia Plan  ASA: II  Anesthesia Plan: General   Post-op Pain Management:    Induction: Intravenous  Airway Management Planned: Oral ETT  Additional Equipment:   Intra-op Plan:   Post-operative Plan: Extubation in OR  Informed Consent: I have reviewed the patients History and Physical, chart, labs and discussed the procedure including the risks, benefits and alternatives for the proposed anesthesia with the patient or authorized representative who has indicated his/her understanding and acceptance.   Dental advisory given  Plan Discussed with: CRNA  Anesthesia Plan Comments:         Anesthesia Quick Evaluation

## 2014-09-10 NOTE — Anesthesia Postprocedure Evaluation (Signed)
  Anesthesia Post-op Note  Patient: Morgan Fleming  Procedure(s) Performed: Procedure(s): RIGHT THYROID LOBECTOMY (Right)  Patient Location: PACU  Anesthesia Type:General  Level of Consciousness: awake and alert   Airway and Oxygen Therapy: Patient Spontanous Breathing  Post-op Pain: mild  Post-op Assessment: Post-op Vital signs reviewed, Patient's Cardiovascular Status Stable and Respiratory Function Stable  Post-op Vital Signs: Reviewed  Filed Vitals:   09/10/14 1100  BP: 132/79  Pulse: 59  Temp:   Resp:     Complications: No apparent anesthesia complications

## 2014-09-10 NOTE — Transfer of Care (Signed)
Immediate Anesthesia Transfer of Care Note  Patient: Morgan Fleming  Procedure(s) Performed: Procedure(s): RIGHT THYROID LOBECTOMY (Right)  Patient Location: PACU  Anesthesia Type:General  Level of Consciousness: sedated  Airway & Oxygen Therapy: Patient Spontanous Breathing and Patient connected to face mask oxygen  Post-op Assessment: Report given to RN and Post -op Vital signs reviewed and stable  Post vital signs: Reviewed and stable  Last Vitals:  Filed Vitals:   09/10/14 0733  BP: 141/97  Pulse: 88  Temp: 36.6 C  Resp: 18    Complications: No apparent anesthesia complications

## 2014-09-10 NOTE — Interval H&P Note (Signed)
History and Physical Interval Note:  09/10/2014 9:18 AM  Morgan Fleming  has presented today for surgery, with the diagnosis of RIGHT THYROID NEOPLASM OF UNCERTAIN BEHAVIOR.  The various methods of treatment have been discussed with the patient and family. After consideration of risks, benefits and other options for treatment, the patient has consented to    Procedure(s): RIGHT THYROID LOBECTOMY (Right) as a surgical intervention .    The patient's history has been reviewed, patient examined, no change in status, stable for surgery.  I have reviewed the patient's chart and labs.  Questions were answered to the patient's satisfaction.    [name], MD, [facility] Office: [place]

## 2014-09-10 NOTE — Op Note (Signed)
Procedure Note  Pre-operative Diagnosis:  Right thyroid neoplasm of uncertain behavior  Post-operative Diagnosis:  same  Surgeon:  [name], MD, FACS  Assistant:  none   Procedure:  Right thyroid lobectomy  Anesthesia:  General  Estimated Blood Loss:  minimal  Drains: none         Specimen: thyroid lobe to pathology  Indications:  Patient is referred by Dr. 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 Patient noted on self-examination a mass in the right lobe of the thyroid. She was seen by her gynecologist and sent for ultrasound. This was performed on July 14, 2014. It showed a dominant 3.5 cm mass in the right thyroid lobe. Left thyroid lobe was normal. Subsequent fine-needle aspiration biopsy was performed on August 12, 2014. 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   Procedure Details: Procedure was done in OR #12 at the [facility].  The patient was brought to the operating room and placed in a supine position on the operating room table.  Following administration of general anesthesia, the patient was positioned and then prepped and draped in the usual aseptic fashion.  After ascertaining that an adequate level of anesthesia had been achieved, a Kocher incision was made with #15 blade.  Dissection was carried through subcutaneous tissues and platysma. Hemostasis was achieved with the electrocautery.  Skin flaps were elevated cephalad and caudad from the thyroid notch to the sternal notch.  A self-retaining retractor was placed for exposure.  Strap muscles were incised in the midline and dissection was begun on the right side.  Strap muscles were reflected laterally.  The right thyroid lobe was enlarged with a dominant mass in the inferior pole.  The lobe was gently mobilized with blunt dissection.  Superior pole vessels were dissected out and divided  individually between small and medium Ligaclips with the Harmonic scalpel.  The thyroid lobe was rolled anteriorly.  Branches of the inferior thyroid artery were divided between small Ligaclips with the Harmonic scalpel.  Inferior venous tributaries were divided between Ligaclips.  Both the superior and inferior parathyroid glands were identified and preserved on their vascular pedicles.  The recurrent laryngeal nerve was identified and preserved along its course.  The ligament of Gwenlyn Found was released with the electrocautery and the gland was mobilized onto the anterior trachea. Isthmus was mobilized across the midline.  There was moderate sized pyramidal lobe present which was resected with the thyroid isthmus.  The thyroid parenchyma was transected at the junction of the isthmus and contralateral thyroid lobe with the Harmonic scalpel.  The thyroid lobe and isthmus were submitted to pathology for review.  The neck was irrigated with warm saline.  Fibular was placed throughout the operative field.  Strap muscles were reapproximated in the midline with interrupted 3-0 Vicryl sutures.  Platysma was closed with interrupted 3-0 Vicryl sutures.  Skin was closed with a running 4-0 Monocryl subcuticular suture.  Wound was washed and dried and topical glue (Dermabond) was applied to the skin as dressing.  The patient was awakened from anesthesia and brought to the recovery room.  The patient tolerated the procedure well.   [name], MD, [facility] Office: [phone]

## 2014-09-10 NOTE — Anesthesia Procedure Notes (Signed)
Procedure Name: Intubation Date/Time: 09/10/2014 9:28 AM Performed by: Lind Covert Pre-anesthesia Checklist: Patient identified, Emergency Drugs available, Suction available and Patient being monitored Patient Re-evaluated:Patient Re-evaluated prior to inductionOxygen Delivery Method: Circle system utilized Preoxygenation: Pre-oxygenation with 100% oxygen Intubation Type: IV induction Ventilation: Mask ventilation without difficulty Laryngoscope Size: Mac and 3 Grade View: Grade I Tube type: Oral Tube size: 7.0 mm Number of attempts: 1 Placement Confirmation: ETT inserted through vocal cords under direct vision,  positive ETCO2,  breath sounds checked- equal and bilateral and CO2 detector Secured at: 22 cm Tube secured with: Tape Dental Injury: Teeth and Oropharynx as per pre-operative assessment

## 2014-09-11 ENCOUNTER — Encounter (HOSPITAL_COMMUNITY): Payer: Self-pay | Admitting: Surgery

## 2014-09-11 DIAGNOSIS — C73 Malignant neoplasm of thyroid gland: Secondary | ICD-10-CM | POA: Diagnosis not present

## 2014-09-11 MED ORDER — OXYCODONE HCL 5 MG PO TABS: 5.0000 mg | ORAL_TABLET | Freq: Four times a day (QID) | ORAL | Status: DC | PRN

## 2014-09-11 NOTE — Progress Notes (Signed)
UR completed 

## 2014-09-11 NOTE — Discharge Summary (Signed)
Physician Discharge Summary [facility]  Patient ID: Morgan Fleming MRN: [id number] DOB/AGE: 17-Feb-1981 34 y.o.  Admit date: 09/10/2014 Discharge date: 09/11/2014  Admission Diagnoses:  Right thyroid neoplasm of uncertain behavior  Discharge Diagnoses:  Principal Problem:   Neoplasm of uncertain behavior of thyroid gland, right lobe   Discharged Condition: good  Hospital Course: Patient was admitted for observation following thyroid surgery.  Post op course was uncomplicated.  Pain was well controlled.  Tolerated diet.  Patient was prepared for discharge home on POD#1.  Consults: None  Treatments: surgery: right thyroid lobectomy  Discharge Exam: Blood pressure 125/74, pulse 72, temperature 98.1 F (36.7 C), temperature source Oral, resp. rate 16, height 5\' 3"  (1.6 m), weight 84.823 kg (187 lb), SpO2 98 %, currently breastfeeding. HEENT - clear Neck - wound dry and intact, minimal STS, voice slightly hoarse Chest - clear bilaterally Cor - RRR  Disposition: Home  Discharge Instructions    Diet - low sodium heart healthy    Complete by:  As directed      Discharge instructions    Complete by:  As directed   [facility]  THYROID & PARATHYROID SURGERY:  POST-OP INSTRUCTIONS  Always review your discharge instruction sheet from the facility where your surgery was performed.  A prescription for pain medication may be given to you upon discharge.  Take your pain medication as prescribed.  If narcotic pain medicine is not needed, then you may take acetaminophen (Tylenol) or ibuprofen (Advil) as needed.  Take your usually prescribed medications unless otherwise directed.  If you need a refill on your pain medication, please contact your pharmacy. They will contact our office to request authorization.  Prescriptions will not be processed after 5 pm or on weekends.  Start with a light diet upon arrival home, such as soup and crackers or  toast.  Be sure to drink plenty of fluids daily.  Resume your normal diet the day after surgery.  Most patients will experience some swelling and bruising on the chest and neck area.  Ice packs will help.  Swelling and bruising can take several days to resolve.   It is common to experience some constipation after surgery.  Increasing fluid intake and taking a stool softener will usually help or prevent this problem.  A mild laxative (Milk of Magnesia or Miralax) should be taken according to package directions if there has been no bowel movement after 48 hours.  If you have steri-strips and a gauze dressing over your incision, you may remove the gauze bandage on the second day after surgery, and you may shower at that time.  Leave your steri-strips (small skin tapes) in place directly over the incision.  These strips should remain on the skin for 7-10 days and then be removed.  You may get them wet in the shower and pat them dry.  If you have Dermabond (topical glue) over your incision, you may shower at any time following your procedure.  Leave the glue in place for 10-14 days.  When it begins to flake off around the edges, you may remove it.  You may resume regular (light) daily activities beginning the next day-such as daily self-care, walking, climbing stairs-gradually increasing activities as tolerated.  You may have sexual intercourse when it is comfortable.  Refrain from any heavy lifting or straining until approved by your doctor.  You may drive when you no longer are taking prescription pain medication, you can comfortably wear a  seatbelt, and you can safely maneuver your car and apply brakes.  You should see your doctor in the office for a follow-up appointment approximately two to three weeks after your surgery.  Make sure that you call for this appointment within a day or two after you arrive home to insure a convenient appointment time.  WHEN TO CALL YOUR DOCTOR: -- Fever greater than  101.5 -- Inability to urinate -- Nausea and/or vomiting - persistent -- Extreme swelling or bruising -- Continued bleeding from incision -- Increased pain, redness, or drainage from the incision -- Difficulty swallowing or breathing -- Muscle cramping or spasms -- Numbness or tingling in hands or around lips  The clinic staff is available to answer your questions during regular business hours.  Please don't hesitate to call and ask to speak to one of the nurses if you have concerns.  [name], MD, [facility] Office: [phone]  Website: [email or web address]     Increase activity slowly    Complete by:  As directed      No dressing needed    Complete by:  As directed             Medication List    TAKE these medications        albuterol 108 (90 BASE) MCG/ACT inhaler  Commonly known as:  PROVENTIL HFA;VENTOLIN HFA  Inhale 2 puffs into the lungs every 6 (six) hours as needed for wheezing or shortness of breath.     ferrous sulfate 325 (65 FE) MG tablet  Take 1 tablet (325 mg total) by mouth 2 (two) times daily with a meal.     Fluticasone-Salmeterol 100-50 MCG/DOSE Aepb  Commonly known as:  ADVAIR  Inhale 1 puff into the lungs every morning.     ibuprofen 600 MG tablet  Commonly known as:  ADVIL,MOTRIN  Take 1 tablet (600 mg total) by mouth every 6 (six) hours as needed for mild pain.     ondansetron 4 MG/2ML Soln injection  Commonly known as:  ZOFRAN  Inject 2 mLs (4 mg total) into the vein every 8 (eight) hours as needed for nausea.     ondansetron 4 MG tablet  Commonly known as:  ZOFRAN  Take 1 tablet (4 mg total) by mouth every 4 (four) hours as needed for nausea.     oxyCODONE 5 MG immediate release tablet  Commonly known as:  Oxy IR/ROXICODONE  Take 1-2 tablets (5-10 mg total) by mouth every 6 (six) hours as needed for moderate pain.     oxyCODONE-acetaminophen 5-325 MG per tablet   Commonly known as:  PERCOCET/ROXICET  Take 1-2 tablets by mouth every 6 (six) hours as needed (for pain scale equal to or greater than 7).     PRENATAL PO  Take 1 tablet by mouth every morning.     senna-docusate 8.6-50 MG per tablet  Commonly known as:  Senokot-S  Take 2 tablets by mouth at bedtime.     simethicone 80 MG chewable tablet  Commonly known as:  MYLICON  Chew 1 tablet (80 mg total) by mouth 2 (two) times daily after a meal.           Follow-up Information    Follow up with [name], MD. Schedule an appointment as soon as possible for a visit in 3 weeks.   Specialty:  General Surgery   Why:  For wound re-check   Contact information:   [address] [place] [address] [phone]       [name], MD, [facility] Office: [phone]   Signed: [name] 09/11/2014, 9:33 AM

## 2014-09-15 ENCOUNTER — Ambulatory Visit: Payer: Self-pay | Admitting: Surgery

## 2014-09-15 ENCOUNTER — Encounter (HOSPITAL_COMMUNITY): Payer: Self-pay | Admitting: *Deleted

## 2014-09-24 ENCOUNTER — Encounter (HOSPITAL_COMMUNITY): Payer: Self-pay | Admitting: Surgery

## 2014-09-24 DIAGNOSIS — C73 Malignant neoplasm of thyroid gland: Secondary | ICD-10-CM | POA: Diagnosis present

## 2014-09-24 NOTE — H&P (View-Only) (Signed)
General Surgery [facility]  Morgan Fleming 09/04/2014 4:21 PM Patient #: [id number] DOB: [birth year] Married / Language: English / Race: Black or African American Female  History of Present Illness Morgan Regal MD; 09/04/2014 5:17 PM)  The patient is a 34 year old female who presents with a thyroid nodule. Patient is referred by Dr. 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 Patient noted on self-examination a mass in the right lobe of the thyroid. She was seen by her gynecologist and sent for ultrasound. This was performed on July 14, 2014. It showed a dominant 3.5 cm mass in the right thyroid lobe. Left thyroid lobe was normal. Subsequent fine-needle aspiration biopsy was performed on August 12, 2014. 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 Patient has no prior history of thyroid disease. She has never been on thyroid medication. She has had no prior head or neck surgery. There is a family history of thyroid nodules and the patient's mother who underwent thyroid lobectomy. There is no history of malignancy. There is no family history of other endocrine neoplasms. Patient denies any compressive symptoms. She denies tremor. She denies palpitations.   Other Problems Morgan Fleming, CMA; 09/04/2014 1:93 PM) Asthma Umbilical Hernia Repair  Past Surgical History Morgan Fleming, CMA; 09/04/2014 3:51 PM) Cesarean Section - 1  Diagnostic Studies History Morgan Fleming, CMA; 09/04/2014 3:51 PM) Colonoscopy never Mammogram never Pap Smear 1-5 years ago  Allergies Morgan Fleming, CMA; 09/04/2014 3:54 PM) Singulair *ANTIASTHMATIC AND BRONCHODILATOR AGENTS* Nasonex *NASAL AGENTS - SYSTEMIC AND TOPICAL*  Medication History Morgan Fleming, CMA; 09/04/2014 3:54 PM) Advair Diskus (100-50MCG/DOSE Aero Pow Br Act, Inhalation) Active. ProAir HFA (108 (90  Base)MCG/ACT Aerosol Soln, Inhalation) Active. Medications Reconciled  Social History Morgan Fleming, CMA; 09/04/2014 3:51 PM) Alcohol use Occasional alcohol use. Caffeine use Coffee, Tea. No drug use Tobacco use Never smoker.  Family History Morgan Fleming, [place]; 09/04/2014 3:51 PM) Arthritis Father, Mother. Diabetes Mellitus Family Members In General. Hypertension Father, Mother. Respiratory Condition Family Members In General. Seizure disorder Family Members In General. Thyroid problems Mother.  Pregnancy / Birth History Morgan Fleming, [place]; 09/04/2014 3:51 PM) Gravida 4 Maternal age 26-30 Para 2 Regular periods  Review of Systems ([place]; 09/04/2014 3:51 PM) General Not Present- Appetite Loss, Chills, Fatigue, Fever, Night Sweats, Weight Gain and Weight Loss. Skin Not Present- Change in Wart/Mole, Dryness, Hives, Jaundice, New Lesions, Non-Healing Wounds, Rash and Ulcer. HEENT Present- Seasonal Allergies. Not Present- Earache, Hearing Loss, Hoarseness, Nose Bleed, Oral Ulcers, Ringing in the Ears, Sinus Pain, Sore Throat, Visual Disturbances, Wears glasses/contact lenses and Yellow Eyes. Respiratory Not Present- Bloody sputum, Chronic Cough, Difficulty Breathing, Snoring and Wheezing. Breast Not Present- Breast Mass, Breast Pain, Nipple Discharge and Skin Changes. Cardiovascular Not Present- Chest Pain, Difficulty Breathing Lying Down, Leg Cramps, Palpitations, Rapid Heart Rate, Shortness of Breath and Swelling of Extremities. Gastrointestinal Not Present- Abdominal Pain, Bloating, Bloody Stool, Change in Bowel Habits, Chronic diarrhea, Constipation, Difficulty Swallowing, Excessive gas, Gets full quickly at meals, Hemorrhoids, Indigestion, Nausea, Rectal Pain and Vomiting. Female Genitourinary Not Present- Frequency, Nocturia, Painful Urination, Pelvic Pain and Urgency. Musculoskeletal Not Present- Back Pain, Joint Pain, Joint Stiffness, Muscle Pain, Muscle Weakness  and Swelling of Extremities. Neurological Not Present- Decreased Memory, Fainting, Headaches, Numbness, Seizures, Tingling, Tremor, Trouble walking and Weakness. Psychiatric Not Present- Anxiety, Bipolar, Change in Sleep Pattern, Depression, Fearful and Frequent crying. Endocrine Not Present- Cold Intolerance,  Excessive Hunger, Hair Changes, Heat Intolerance, Hot flashes and New Diabetes. Hematology Present- Easy Bruising. Not Present- Excessive bleeding, Gland problems, HIV and Persistent Infections.   Vitals (Morgan Fleming CMA; 09/04/2014 3:53 PM) 09/04/2014 3:52 PM Weight: 189 lb Height: 63in Body Surface Area: 1.95 m Body Mass Index: 33.48 kg/m Temp.: 97.1F(Temporal)  Pulse: 73 (Regular)  BP: 126/77 (Sitting, Left Arm, Standard)   Physical Exam Morgan Regal MD; 09/04/2014 5:18 PM)  General - appears comfortable, no distress; not diaphorectic  HEENT - normocephalic; sclerae clear, gaze conjugate; mucous membranes moist, dentition good; voice normal  Neck - asymmetric on extension; no palpable anterior or posterior cervical adenopathy; dominant palpable mass right thyroid lobe, approximately 3-4 cm, quite firm, mobile, smooth, nontender; left thyroid lobe without palpable abnormality  Chest - clear bilaterally with rhonchi, rales, or wheeze  Cor - regular rhythm with normal rate; no significant murmur  Ext - non-tender without significant edema or lymphedema  Neuro - grossly intact; no tremor    Assessment & Plan Morgan Regal MD; 09/04/2014 5:20 PM)  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 I have provided she and her husband with written literature to review at home.  After a thorough discussion, I have recommended right thyroid lobectomy for definitive diagnosis. We have discussed risk and benefits of the procedure including  injury to recurrent laryngeal nerve and injury to parathyroid glands. We have discussed the possibility of malignancy with a risk of cancer of 20-25%. In the event of malignancy, she would require completion thyroidectomy. We have discussed the hospital stay, the postoperative recovery, and the return to normal activity. We have discussed possible need for radioactive iodine treatment. We have discussed the possible need for lifelong thyroid hormone replacement. Patient and her husband understand and wish to proceed with surgery in the near future.  The risks and benefits of the procedure have been discussed at length with the patient. The patient understands the proposed procedure, potential alternative treatments, and the course of recovery to be expected. All of the patient's questions have been answered at this time. The patient wishes to proceed with surgery.  Morgan Regal, MD, [facility] Office: [phone]

## 2014-09-24 NOTE — Interval H&P Note (Signed)
History and Physical Interval Note:  09/24/2014 4:10 PM  Morgan Fleming  has presented today for surgery, with the diagnosis of THYROID CARCINOMA.  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  The various methods of treatment have been discussed with the patient and family. After consideration of risks, benefits and other options for treatment, the patient has consented to    Procedure(s): COMPLETION THYROIDECTOMY  (N/A) as a surgical intervention .    The patient's history has been reviewed, patient examined, no change in status, stable for surgery.  I have reviewed the patient's chart and labs.  Questions were answered to the patient's satisfaction.    [name], MD, [facility] Office: [place]

## 2014-09-25 ENCOUNTER — Observation Stay (HOSPITAL_COMMUNITY)
Admission: RE | Admit: 2014-09-25 | Discharge: 2014-09-26 | Disposition: A | Discharge status: Home / Self Care | Payer: BC Managed Care – PPO | Source: Ambulatory Visit | Attending: Surgery | Admitting: Surgery

## 2014-09-25 ENCOUNTER — Ambulatory Visit (HOSPITAL_COMMUNITY): Payer: BC Managed Care – PPO | Admitting: Anesthesiology

## 2014-09-25 ENCOUNTER — Encounter (HOSPITAL_COMMUNITY)
Admission: RE | Disposition: A | Discharge status: Home / Self Care | Payer: Self-pay | Source: Ambulatory Visit | Attending: Surgery

## 2014-09-25 ENCOUNTER — Encounter (HOSPITAL_COMMUNITY): Payer: Self-pay | Admitting: *Deleted

## 2014-09-25 DIAGNOSIS — Z833 Family history of diabetes mellitus: Secondary | ICD-10-CM | POA: Diagnosis not present

## 2014-09-25 DIAGNOSIS — C73 Malignant neoplasm of thyroid gland: Secondary | ICD-10-CM | POA: Diagnosis present

## 2014-09-25 DIAGNOSIS — Z8249 Family history of ischemic heart disease and other diseases of the circulatory system: Secondary | ICD-10-CM | POA: Diagnosis not present

## 2014-09-25 DIAGNOSIS — E069 Thyroiditis, unspecified: Secondary | ICD-10-CM | POA: Diagnosis not present

## 2014-09-25 DIAGNOSIS — J45909 Unspecified asthma, uncomplicated: Secondary | ICD-10-CM | POA: Insufficient documentation

## 2014-09-25 HISTORY — PX: THYROIDECTOMY: SHX17

## 2014-09-25 SURGERY — THYROIDECTOMY
Anesthesia: General | Site: Neck

## 2014-09-25 MED ORDER — LIDOCAINE HCL (CARDIAC) 20 MG/ML IV SOLN
INTRAVENOUS | Status: DC | PRN
  Administered 2014-09-25: 50 mg via INTRAVENOUS
  Administered 2014-09-25: 40 mg via INTRAVENOUS

## 2014-09-25 MED ORDER — MIDAZOLAM HCL 5 MG/5ML IJ SOLN
INTRAMUSCULAR | Status: DC | PRN
  Administered 2014-09-25: 2 mg via INTRAVENOUS

## 2014-09-25 MED ORDER — DEXAMETHASONE SODIUM PHOSPHATE 10 MG/ML IJ SOLN
INTRAMUSCULAR | Status: DC | PRN
  Administered 2014-09-25: 10 mg via INTRAVENOUS

## 2014-09-25 MED ORDER — LIDOCAINE HCL (CARDIAC) 20 MG/ML IV SOLN
INTRAVENOUS | Status: AC
  Filled 2014-09-25: qty 5

## 2014-09-25 MED ORDER — FENTANYL CITRATE 0.05 MG/ML IJ SOLN
INTRAMUSCULAR | Status: AC
Start: 2014-09-25 — End: 2014-09-25
  Filled 2014-09-25: qty 2

## 2014-09-25 MED ORDER — ROCURONIUM BROMIDE 100 MG/10ML IV SOLN
INTRAVENOUS | Status: AC
  Filled 2014-09-25: qty 1

## 2014-09-25 MED ORDER — ONDANSETRON HCL 4 MG/2ML IJ SOLN: 4.0000 mg | Freq: Four times a day (QID) | INTRAMUSCULAR | Status: DC | PRN

## 2014-09-25 MED ORDER — CEFAZOLIN SODIUM-DEXTROSE 2-3 GM-% IV SOLR
INTRAVENOUS | Status: AC
  Filled 2014-09-25: qty 50

## 2014-09-25 MED ORDER — FENTANYL CITRATE 0.05 MG/ML IJ SOLN
INTRAMUSCULAR | Status: DC | PRN
Start: 2014-09-25 — End: 2014-09-25
  Administered 2014-09-25 (×2): 50 ug via INTRAVENOUS
  Administered 2014-09-25: 100 ug via INTRAVENOUS

## 2014-09-25 MED ORDER — HYDROMORPHONE HCL 1 MG/ML IJ SOLN: 1.0000 mg | INTRAMUSCULAR | Status: DC | PRN

## 2014-09-25 MED ORDER — ACETAMINOPHEN 325 MG PO TABS
650.0000 mg | ORAL_TABLET | ORAL | Status: DC | PRN
  Administered 2014-09-25: 650 mg via ORAL
  Filled 2014-09-25 (×2): qty 2

## 2014-09-25 MED ORDER — NEOSTIGMINE METHYLSULFATE 10 MG/10ML IV SOLN
INTRAVENOUS | Status: DC | PRN
  Administered 2014-09-25: 3 mg via INTRAVENOUS

## 2014-09-25 MED ORDER — GLYCOPYRROLATE 0.2 MG/ML IJ SOLN
INTRAMUSCULAR | Status: DC | PRN
  Administered 2014-09-25: 0.4 mg via INTRAVENOUS
  Administered 2014-09-25: 0.2 mg via INTRAVENOUS

## 2014-09-25 MED ORDER — MOMETASONE FURO-FORMOTEROL FUM 100-5 MCG/ACT IN AERO: 2.0000 | INHALATION_SPRAY | Freq: Two times a day (BID) | RESPIRATORY_TRACT | Status: DC

## 2014-09-25 MED ORDER — PROPOFOL 10 MG/ML IV BOLUS
INTRAVENOUS | Status: AC
  Filled 2014-09-25: qty 20

## 2014-09-25 MED ORDER — CEFAZOLIN SODIUM-DEXTROSE 2-3 GM-% IV SOLR
2.0000 g | INTRAVENOUS | Status: AC
  Administered 2014-09-25: 2 g via INTRAVENOUS

## 2014-09-25 MED ORDER — DEXAMETHASONE SODIUM PHOSPHATE 10 MG/ML IJ SOLN
INTRAMUSCULAR | Status: AC
  Filled 2014-09-25: qty 1

## 2014-09-25 MED ORDER — BUDESONIDE-FORMOTEROL FUMARATE 80-4.5 MCG/ACT IN AERO
2.0000 | INHALATION_SPRAY | Freq: Two times a day (BID) | RESPIRATORY_TRACT | Status: DC
  Administered 2014-09-25 – 2014-09-26 (×2): 2 via RESPIRATORY_TRACT
  Filled 2014-09-25: qty 6.9

## 2014-09-25 MED ORDER — MIDAZOLAM HCL 2 MG/2ML IJ SOLN
INTRAMUSCULAR | Status: AC
  Filled 2014-09-25: qty 2

## 2014-09-25 MED ORDER — KCL IN DEXTROSE-NACL 20-5-0.45 MEQ/L-%-% IV SOLN
INTRAVENOUS | Status: DC
  Administered 2014-09-25: 16:00:00 via INTRAVENOUS
  Filled 2014-09-25 (×2): qty 1000

## 2014-09-25 MED ORDER — PROMETHAZINE HCL 25 MG/ML IJ SOLN
12.5000 mg | INTRAMUSCULAR | Status: DC | PRN
  Administered 2014-09-25: 12.5 mg via INTRAVENOUS
  Filled 2014-09-25: qty 1

## 2014-09-25 MED ORDER — 0.9 % SODIUM CHLORIDE (POUR BTL) OPTIME
TOPICAL | Status: DC | PRN
  Administered 2014-09-25: 1000 mL

## 2014-09-25 MED ORDER — GLYCOPYRROLATE 0.2 MG/ML IJ SOLN
INTRAMUSCULAR | Status: AC
  Filled 2014-09-25: qty 2

## 2014-09-25 MED ORDER — HYDROMORPHONE HCL 1 MG/ML IJ SOLN
0.2500 mg | INTRAMUSCULAR | Status: DC | PRN
  Administered 2014-09-25 (×2): 0.25 mg via INTRAVENOUS

## 2014-09-25 MED ORDER — PROPOFOL 10 MG/ML IV BOLUS
INTRAVENOUS | Status: DC | PRN
  Administered 2014-09-25: 150 mg via INTRAVENOUS

## 2014-09-25 MED ORDER — FENTANYL CITRATE 0.05 MG/ML IJ SOLN
INTRAMUSCULAR | Status: AC
  Filled 2014-09-25: qty 2

## 2014-09-25 MED ORDER — LACTATED RINGERS IV SOLN: INTRAVENOUS | Status: DC

## 2014-09-25 MED ORDER — CALCIUM CARBONATE 1250 (500 CA) MG PO TABS
2.0000 | ORAL_TABLET | Freq: Three times a day (TID) | ORAL | Status: DC
  Administered 2014-09-26 (×2): 1000 mg via ORAL
  Filled 2014-09-25 (×5): qty 2

## 2014-09-25 MED ORDER — ONDANSETRON HCL 4 MG/2ML IJ SOLN
INTRAMUSCULAR | Status: AC
  Filled 2014-09-25: qty 2

## 2014-09-25 MED ORDER — ALBUTEROL SULFATE (2.5 MG/3ML) 0.083% IN NEBU: 3.0000 mL | INHALATION_SOLUTION | Freq: Four times a day (QID) | RESPIRATORY_TRACT | Status: DC | PRN

## 2014-09-25 MED ORDER — NEOSTIGMINE METHYLSULFATE 10 MG/10ML IV SOLN
INTRAVENOUS | Status: AC
  Filled 2014-09-25: qty 1

## 2014-09-25 MED ORDER — ONDANSETRON HCL 4 MG/2ML IJ SOLN
INTRAMUSCULAR | Status: DC | PRN
  Administered 2014-09-25: 4 mg via INTRAVENOUS

## 2014-09-25 MED ORDER — LACTATED RINGERS IV SOLN
INTRAVENOUS | Status: DC
  Administered 2014-09-25: 1000 mL via INTRAVENOUS

## 2014-09-25 MED ORDER — LACTATED RINGERS IV SOLN
INTRAVENOUS | Status: DC | PRN
  Administered 2014-09-25: 13:00:00 via INTRAVENOUS

## 2014-09-25 MED ORDER — ONDANSETRON HCL 4 MG PO TABS: 4.0000 mg | ORAL_TABLET | Freq: Four times a day (QID) | ORAL | Status: DC | PRN

## 2014-09-25 MED ORDER — HYDROMORPHONE HCL 1 MG/ML IJ SOLN
INTRAMUSCULAR | Status: AC
  Filled 2014-09-25: qty 1

## 2014-09-25 MED ORDER — ROCURONIUM BROMIDE 100 MG/10ML IV SOLN
INTRAVENOUS | Status: DC | PRN
  Administered 2014-09-25: 10 mg via INTRAVENOUS
  Administered 2014-09-25: 40 mg via INTRAVENOUS

## 2014-09-25 MED ORDER — OXYCODONE HCL 5 MG PO TABS: 5.0000 mg | ORAL_TABLET | Freq: Four times a day (QID) | ORAL | Status: DC | PRN

## 2014-09-25 SURGICAL SUPPLY — 36 items
ATTRACTOMAT 16X20 MAGNETIC DRP (DRAPES) ×2 IMPLANT
BENZOIN TINCTURE PRP APPL 2/3 (GAUZE/BANDAGES/DRESSINGS) ×2 IMPLANT
BLADE HEX COATED 2.75 (ELECTRODE) ×2 IMPLANT
BLADE SURG 15 STRL LF DISP TIS (BLADE) ×1 IMPLANT
BLADE SURG 15 STRL SS (BLADE) ×1
CHLORAPREP W/TINT 26ML (MISCELLANEOUS) ×2 IMPLANT
CLIP TI MEDIUM 6 (CLIP) ×4 IMPLANT
CLIP TI WIDE RED SMALL 6 (CLIP) ×4 IMPLANT
DISSECTOR ROUND CHERRY 3/8 STR (MISCELLANEOUS) ×2 IMPLANT
DRAPE LAPAROTOMY T 98X78 PEDS (DRAPES) ×2 IMPLANT
DRESSING SURGICEL FIBRLLR 1X2 (HEMOSTASIS) ×1 IMPLANT
DRSG SURGICEL FIBRILLAR 1X2 (HEMOSTASIS) ×2
ELECT REM PT RETURN 9FT ADLT (ELECTROSURGICAL) ×2
ELECTRODE REM PT RTRN 9FT ADLT (ELECTROSURGICAL) ×1 IMPLANT
GAUZE SPONGE 4X4 12PLY STRL (GAUZE/BANDAGES/DRESSINGS) IMPLANT
GAUZE SPONGE 4X4 16PLY XRAY LF (GAUZE/BANDAGES/DRESSINGS) ×2 IMPLANT
GLOVE SURG ORTHO 8.0 STRL STRW (GLOVE) ×2 IMPLANT
GOWN STRL REUS W/TWL XL LVL3 (GOWN DISPOSABLE) ×6 IMPLANT
KIT BASIN OR (CUSTOM PROCEDURE TRAY) ×2 IMPLANT
LIQUID BAND (GAUZE/BANDAGES/DRESSINGS) IMPLANT
PACK BASIC VI WITH GOWN DISP (CUSTOM PROCEDURE TRAY) ×2 IMPLANT
PENCIL BUTTON HOLSTER BLD 10FT (ELECTRODE) ×2 IMPLANT
SHEARS HARMONIC 9CM CVD (BLADE) ×2 IMPLANT
STAPLER VISISTAT 35W (STAPLE) IMPLANT
STRIP CLOSURE SKIN 1/2X4 (GAUZE/BANDAGES/DRESSINGS) ×2 IMPLANT
SUT MNCRL AB 4-0 PS2 18 (SUTURE) ×4 IMPLANT
SUT SILK 2 0 (SUTURE)
SUT SILK 2-0 18XBRD TIE 12 (SUTURE) IMPLANT
SUT SILK 3 0 (SUTURE)
SUT SILK 3-0 18XBRD TIE 12 (SUTURE) IMPLANT
SUT VIC AB 3-0 SH 18 (SUTURE) ×2 IMPLANT
SYR BULB IRRIGATION 50ML (SYRINGE) ×2 IMPLANT
TAPE CLOTH SURG 4X10 WHT LF (GAUZE/BANDAGES/DRESSINGS) ×2 IMPLANT
TOWEL OR 17X26 10 PK STRL BLUE (TOWEL DISPOSABLE) ×2 IMPLANT
TOWEL OR NON WOVEN STRL DISP B (DISPOSABLE) ×2 IMPLANT
YANKAUER SUCT BULB TIP 10FT TU (MISCELLANEOUS) ×2 IMPLANT

## 2014-09-25 NOTE — Anesthesia Preprocedure Evaluation (Addendum)
Anesthesia Evaluation  Patient identified by MRN, date of birth, ID band Patient awake    Reviewed: Allergy & Precautions, H&P , NPO status , Patient's Chart, lab work & pertinent test results  Airway Mallampati: II  TM Distance: >3 FB Neck ROM: Full    Dental no notable dental hx. (+) Teeth Intact, Dental Advisory Given   Pulmonary asthma ,  breath sounds clear to auscultation  Pulmonary exam normal       Cardiovascular negative cardio ROS  Rhythm:Regular Rate:Normal     Neuro/Psych negative neurological ROS  negative psych ROS   GI/Hepatic negative GI ROS, Neg liver ROS,   Endo/Other  negative endocrine ROS  Renal/GU negative Renal ROS  negative genitourinary   Musculoskeletal   Abdominal   Peds  Hematology negative hematology ROS (+)   Anesthesia Other Findings   Reproductive/Obstetrics negative OB ROS                             Anesthesia Physical Anesthesia Plan  ASA: II  Anesthesia Plan: General   Post-op Pain Management:    Induction: Intravenous  Airway Management Planned: Oral ETT  Additional Equipment:   Intra-op Plan:   Post-operative Plan: Extubation in OR  Informed Consent:   Plan Discussed with: Surgeon  Anesthesia Plan Comments:         Anesthesia Quick Evaluation

## 2014-09-25 NOTE — Interval H&P Note (Signed)
History and Physical Interval Note:  09/25/2014 12:50 PM  Morgan Fleming  has presented today for surgery, with the diagnosis of THYROID CARCINOMA.  The various methods of treatment have been discussed with the patient and family. After consideration of risks, benefits and other options for treatment, the patient has consented to    Procedure(s): COMPLETION THYROIDECTOMY  (N/A) as a surgical intervention .    The patient's history has been reviewed, patient examined, no change in status, stable for surgery.  I have reviewed the patient's chart and labs.  Questions were answered to the patient's satisfaction.    [name], MD, [facility] Office: [place]

## 2014-09-25 NOTE — Anesthesia Procedure Notes (Signed)
Procedure Name: Intubation Date/Time: 09/25/2014 1:18 PM Performed by: Glory Buff Pre-anesthesia Checklist: Patient identified, Emergency Drugs available, Suction available and Patient being monitored Patient Re-evaluated:Patient Re-evaluated prior to inductionOxygen Delivery Method: Circle System Utilized Preoxygenation: Pre-oxygenation with 100% oxygen Intubation Type: IV induction Ventilation: Mask ventilation without difficulty Laryngoscope Size: Miller and 3 Grade View: Grade I Tube type: Oral Tube size: 7.5 mm Number of attempts: 1 Airway Equipment and Method: Stylet and Oral airway Placement Confirmation: ETT inserted through vocal cords under direct vision,  positive ETCO2 and breath sounds checked- equal and bilateral Secured at: 21 cm Tube secured with: Tape Dental Injury: Teeth and Oropharynx as per pre-operative assessment

## 2014-09-25 NOTE — Brief Op Note (Signed)
09/25/2014  2:22 PM  PATIENT:  Morgan Fleming  34 y.o. female  PRE-OPERATIVE DIAGNOSIS:  THYROID CARCINOMA  POST-OPERATIVE DIAGNOSIS:  THYROID CARCINOMA  PROCEDURE:  Procedure(s): COMPLETION THYROIDECTOMY  (N/A)  SURGEON:  Surgeon(s) and Role:    * Armandina Gemma, MD - Primary  ANESTHESIA:   general  EBL:     BLOOD ADMINISTERED:none  DRAINS: none   LOCAL MEDICATIONS USED:  NONE  SPECIMEN:  Excision  DISPOSITION OF SPECIMEN:  PATHOLOGY  COUNTS:  YES  TOURNIQUET:  * No tourniquets in log *  DICTATION: .Other Dictation: Dictation Number [id number]  PLAN OF CARE: Admit for overnight observation  PATIENT DISPOSITION:  PACU - hemodynamically stable.   Delay start of Pharmacological VTE agent (>24hrs) due to surgical blood loss or risk of bleeding: yes  [name], MD, [facility] Office: [phone]

## 2014-09-25 NOTE — Transfer of Care (Signed)
Immediate Anesthesia Transfer of Care Note  Patient: Morgan Fleming  Procedure(s) Performed: Procedure(s): COMPLETION THYROIDECTOMY  (N/A)  Patient Location: PACU  Anesthesia Type:General  Level of Consciousness: awake, alert  and oriented  Airway & Oxygen Therapy: Patient Spontanous Breathing and Patient connected to face mask oxygen  Post-op Assessment: Report given to RN and Post -op Vital signs reviewed and stable  Post vital signs: Reviewed and stable  Last Vitals:  Filed Vitals:   09/25/14 1007  BP: 140/98  Pulse: 76  Temp: 36.6 C  Resp: 18    Complications: No apparent anesthesia complications

## 2014-09-26 DIAGNOSIS — E069 Thyroiditis, unspecified: Secondary | ICD-10-CM | POA: Diagnosis not present

## 2014-09-26 LAB — BASIC METABOLIC PANEL
Anion gap: 8 (ref 5–15)
BUN: 10 mg/dL (ref 6–23)
CALCIUM: 8.2 mg/dL — AB (ref 8.4–10.5)
CO2: 24 mmol/L (ref 19–32)
Chloride: 104 mmol/L (ref 96–112)
Creatinine, Ser: 0.67 mg/dL (ref 0.50–1.10)
GFR calc Af Amer: >90 mL/min (ref >90)
GFR calc non Af Amer: >90 mL/min (ref >90)
GLUCOSE: 89 mg/dL (ref 70–99)
Potassium: 3.8 mmol/L (ref 3.5–5.1)
SODIUM: 136 mmol/L (ref 135–145)

## 2014-09-26 MED ORDER — SODIUM CHLORIDE 0.9 % IV SOLN
2.0000 g | INTRAVENOUS | Status: AC
  Administered 2014-09-26: 2 g via INTRAVENOUS
  Filled 2014-09-26: qty 20

## 2014-09-26 MED ORDER — CALCIUM CARBONATE-VITAMIN D 500-200 MG-UNIT PO TABS: 2.0000 | ORAL_TABLET | Freq: Three times a day (TID) | ORAL | Status: DC

## 2014-09-26 MED ORDER — SYNTHROID 100 MCG PO TABS: 100.0000 ug | ORAL_TABLET | Freq: Every day | ORAL | Status: DC

## 2014-09-26 NOTE — Progress Notes (Signed)
UR completed 

## 2014-09-26 NOTE — Discharge Instructions (Signed)
[facility]  THYROID & PARATHYROID SURGERY:  POST-OP INSTRUCTIONS  Always review your discharge instruction sheet from the facility where your surgery was performed.  A prescription for pain medication may be given to you upon discharge.  Take your pain medication as prescribed.  If narcotic pain medicine is not needed, then you may take acetaminophen (Tylenol) or ibuprofen (Advil) as needed.  Take your usually prescribed medications unless otherwise directed.  If you need a refill on your pain medication, please contact your pharmacy. They will contact our office to request authorization.  Prescriptions will not be processed after 5 pm or on weekends.  Start with a light diet upon arrival home, such as soup and crackers or toast.  Be sure to drink plenty of fluids daily.  Resume your normal diet the day after surgery.  Most patients will experience some swelling and bruising on the chest and neck area.  Ice packs will help.  Swelling and bruising can take several days to resolve.   It is common to experience some constipation after surgery.  Increasing fluid intake and taking a stool softener will usually help or prevent this problem.  A mild laxative (Milk of Magnesia or Miralax) should be taken according to package directions if there has been no bowel movement after 48 hours.  If you have steri-strips and a gauze dressing over your incision, you may remove the gauze bandage on the second day after surgery, and you may shower at that time.  Leave your steri-strips (small skin tapes) in place directly over the incision.  These strips should remain on the skin for 7-10 days and then be removed.  You may get them wet in the shower and pat them dry.  If you have Dermabond (topical glue) over your incision, you may shower at any time following your procedure.  Leave the glue in place for 10-14 days.  When it begins to flake off around the edges, you may remove it.  You may  resume regular (light) daily activities beginning the next day--such as daily self-care, walking, climbing stairs--gradually increasing activities as tolerated.  You may have sexual intercourse when it is comfortable.  Refrain from any heavy lifting or straining until approved by your doctor.  You may drive when you no longer are taking prescription pain medication, you can comfortably wear a seatbelt, and you can safely maneuver your car and apply brakes.  You should see your doctor in the office for a follow-up appointment approximately two to three weeks after your surgery.  Make sure that you call for this appointment within a day or two after you arrive home to insure a convenient appointment time.  WHEN TO CALL YOUR DOCTOR: -- Fever greater than 101.5 -- Inability to urinate -- Nausea and/or vomiting - persistent -- Extreme swelling or bruising -- Continued bleeding from incision -- Increased pain, redness, or drainage from the incision -- Difficulty swallowing or breathing -- Muscle cramping or spasms -- Numbness or tingling in hands or around lips  The clinic staff is available to answer your questions during regular business hours.  Please dont hesitate to call and ask to speak to one of the nurses if you have concerns.  [name], MD, [facility] Office: [phone]  Website: [email or web address]

## 2014-09-26 NOTE — Progress Notes (Signed)
Reviewed pt discharge instructions. Family at bedside.  No questions at this time.  Pt IV removed. Pt wheeled to front entrance.   Blima Singer., RN

## 2014-09-26 NOTE — Op Note (Signed)
NAMEAMAIAH, Morgan Fleming NO.:  [id number]  MEDICAL RECORD NO.:  [id number]  LOCATION:  18                         FACILITY:  [facility]  PHYSICIAN:  [name], MD      DATE OF BIRTH:  Mar 23, 1981  DATE OF PROCEDURE:  09/25/2014                              OPERATIVE REPORT   PREOPERATIVE DIAGNOSIS:  Follicular variant of papillary thyroid carcinoma.  POSTOPERATIVE DIAGNOSIS:  Follicular variant of papillary thyroid carcinoma.  PROCEDURE:  Completion thyroidectomy.  SURGEON:  [name], MD, FACS  ANESTHESIA:  General.  ESTIMATED BLOOD LOSS:  Minimal.  PREPARATION:  ChloraPrep.  COMPLICATIONS:  None.  INDICATIONS:  The patient is a 34 year old female who underwent right thyroid lobectomy on September 10, 2014, for a 3.5-cm thyroid neoplasm of uncertain behavior.  Final pathology shows a follicular variant of papillary thyroid carcinoma.  The patient now returns to the operating room for completion thyroidectomy.  BODY OF REPORT:  Procedure was done in OR #11 at the [facility].  The patient was brought to the operating room, placed in supine position on the operating room table.  Following administration of general anesthesia, the patient was positioned and then prepped and draped in the usual aseptic fashion.  After ascertaining that an adequate level of anesthesia had been achieved, the previous cervical incision was reopened with a #15 blade.  Dissection was carried through subcutaneous tissues and platysma.  Suture material was extracted.  Subplatysmal planes were reopened with gentle blunt dissection, and a Weitlaner retractor placed for exposure.  Sutures in the midline of the strap muscles were removed.  Strap muscles were separated.  Right thyroid lobe is surgically absent.  Strap muscles were then reflected to the left exposing the left thyroid lobe.  The plane between the strap muscles on the capsule of the left thyroid lobe  was gently developed.  Left lobe was mobilized.  Venous tributaries were divided between small and medium Ligaclips with the Harmonic scalpel. Inferior parathyroid gland was identified and dissected out and preserved on its vascular pedicle.  Superior pole vessels were divided individually between small and medium Ligaclips with the Harmonic scalpel.  Gland was rolled anteriorly.  Branches of the inferior thyroid artery were divided between small Ligaclips.  Ligament of Gwenlyn Found was released with the electrocautery.  The recurrent laryngeal nerve was identified and preserved.  Gland was rolled anteriorly and the remainder of the ligament of Gwenlyn Found was released and the gland was excised off the anterior trachea.  It was submitted to Pathology for review.  There were 2 or 3 small lymph nodes immediately inferior to the left lobe overlying the trachea.  These were also excised and included with the specimen.  The neck was irrigated with warm saline and good hemostasis was noted. Fibrillar was placed throughout the operative field.  Strap muscles were reapproximated in the midline with interrupted 3-0 Vicryl sutures. Platysma was closed with interrupted 3-0 Vicryl sutures.  Skin was closed with a running 4-0 Monocryl subcuticular suture.  Wound was washed and dried and benzoin and Steri-Strips were applied.  Sterile dressings were applied.  The patient was awakened from anesthesia and brought to the recovery room.  The patient tolerated the procedure well.   [name], MD, [facility] Office: [phone]   TMG/MEDQ  D:  09/25/2014  T:  09/26/2014  Job:  [id number]  cc:   Benjiman Core, Dr

## 2014-09-26 NOTE — Discharge Summary (Signed)
Physician Discharge Summary [facility]  Patient ID: Morgan Fleming MRN: [id number] DOB/AGE: Feb 14, 1981 34 y.o.  Admit date: 09/25/2014 Discharge date: 09/26/2014  Admission Diagnoses:  Thyroid carcinoma  Discharge Diagnoses:  Principal Problem:   Thyroid cancer, follicular variant of papillary Active Problems:   Thyroid carcinoma   Discharged Condition: good  Hospital Course: Patient was admitted for observation following thyroid surgery.  Post op course was uncomplicated.  Pain was well controlled.  Tolerated diet.  Post op calcium level on morning following surgery was 8.2 mg/dl.  Patient received IV calcium gluconate 2 gm prior to discharge.  Patient was prepared for discharge home on POD#1.  Consults: None  Treatments: surgery: completion thyroidectomy  Discharge Exam: Blood pressure 128/70, pulse 57, temperature 98.5 F (36.9 C), temperature source Oral, resp. rate 18, height 5\' 3"  (1.6 m), weight 85.276 kg (188 lb), last menstrual period 08/15/2014, SpO2 98 %, currently breastfeeding. HEENT - clear Neck - wound dry and intact; voice normal; minimal soft tissue swelling Chest - clear bilaterally Cor - RRR  Disposition: Home  Discharge Instructions    Diet - low sodium heart healthy    Complete by:  As directed      Increase activity slowly    Complete by:  As directed      Remove dressing in 24 hours    Complete by:  As directed   Apply light gauze dressing to wound before discharge home today.            Medication List    TAKE these medications        albuterol 108 (90 BASE) MCG/ACT inhaler  Commonly known as:  PROVENTIL HFA;VENTOLIN HFA  Inhale 2 puffs into the lungs every 6 (six) hours as needed for wheezing or shortness of breath.     calcium-vitamin D 500-200 MG-UNIT per tablet  Commonly known as:  OSCAL WITH D  Take 2 tablets by mouth 3 (three) times daily.     ferrous sulfate 325 (65 FE) MG tablet  Take 1 tablet (325 mg  total) by mouth 2 (two) times daily with a meal.     Fluticasone-Salmeterol 100-50 MCG/DOSE Aepb  Commonly known as:  ADVAIR  Inhale 1 puff into the lungs every morning.     ibuprofen 600 MG tablet  Commonly known as:  ADVIL,MOTRIN  Take 1 tablet (600 mg total) by mouth every 6 (six) hours as needed for mild pain.     ondansetron 4 MG/2ML Soln injection  Commonly known as:  ZOFRAN  Inject 2 mLs (4 mg total) into the vein every 8 (eight) hours as needed for nausea.     ondansetron 4 MG tablet  Commonly known as:  ZOFRAN  Take 1 tablet (4 mg total) by mouth every 4 (four) hours as needed for nausea.     oxyCODONE 5 MG immediate release tablet  Commonly known as:  Oxy IR/ROXICODONE  Take 1-2 tablets (5-10 mg total) by mouth every 6 (six) hours as needed for moderate pain.     oxyCODONE-acetaminophen 5-325 MG per tablet  Commonly known as:  PERCOCET/ROXICET  Take 1-2 tablets by mouth every 6 (six) hours as needed (for pain scale equal to or greater than 7).     PRENATAL PO  Take 1 tablet by mouth every morning.     senna-docusate 8.6-50 MG per tablet  Commonly known as:  Senokot-S  Take 2 tablets by mouth at bedtime.     simethicone 80 MG  chewable tablet  Commonly known as:  MYLICON  Chew 1 tablet (80 mg total) by mouth 2 (two) times daily after a meal.     SYNTHROID 100 MCG tablet  Generic drug:  levothyroxine  Take 1 tablet (100 mcg total) by mouth daily.           Follow-up Information    Follow up with [name], MD. Schedule an appointment as soon as possible for a visit in 3 weeks.   Specialty:  General Surgery   Why:  For wound re-check   Contact information:   [place] [address] [phone]       [name], MD, [facility] Office: [phone]   Signed: [name] 09/26/2014, 9:02 AM

## 2014-09-28 ENCOUNTER — Encounter (HOSPITAL_COMMUNITY): Payer: Self-pay | Admitting: Surgery

## 2014-09-28 NOTE — Anesthesia Postprocedure Evaluation (Signed)
  Anesthesia Post-op Note  Patient: Morgan Fleming  Procedure(s) Performed: Procedure(s) (LRB): COMPLETION THYROIDECTOMY  (N/A)  Patient Location: PACU  Anesthesia Type: General  Level of Consciousness: awake and alert   Airway and Oxygen Therapy: Patient Spontanous Breathing  Post-op Pain: mild  Post-op Assessment: Post-op Vital signs reviewed, Patient's Cardiovascular Status Stable, Respiratory Function Stable, Patent Airway and No signs of Nausea or vomiting  Last Vitals:  Filed Vitals:   09/26/14 0630  BP: 128/70  Pulse: 57  Temp: 36.9 C  Resp: 18    Post-op Vital Signs: stable   Complications: No apparent anesthesia complications

## 2014-09-28 NOTE — Progress Notes (Signed)
Quick Note:  Please contact patient and notify of benign pathology results.  Mayari Matus M. Moe Brier, MD, FACS [facility] Office: [phone]   ______ 

## 2014-11-23 ENCOUNTER — Ambulatory Visit (INDEPENDENT_AMBULATORY_CARE_PROVIDER_SITE_OTHER): Payer: BC Managed Care – PPO | Admitting: Internal Medicine

## 2014-11-23 ENCOUNTER — Encounter: Payer: Self-pay | Admitting: Internal Medicine

## 2014-11-23 VITALS — BP 130/82 | HR 89 | Temp 98.1°F | Resp 12 | Wt 199.0 lb

## 2014-11-23 DIAGNOSIS — E890 Postprocedural hypothyroidism: Secondary | ICD-10-CM

## 2014-11-23 DIAGNOSIS — C73 Malignant neoplasm of thyroid gland: Secondary | ICD-10-CM

## 2014-11-23 LAB — T4, FREE: FREE T4: 0.7 ng/dL (ref 0.60–1.60)

## 2014-11-23 LAB — TSH: TSH: 15.6 u[IU]/mL — AB (ref 0.35–4.50)

## 2014-11-23 MED ORDER — LEVOTHYROXINE SODIUM 137 MCG PO TABS: 137.0000 ug | ORAL_TABLET | Freq: Every day | ORAL | Status: DC

## 2014-11-23 MED ORDER — SYNTHROID 137 MCG PO TABS: 137.0000 ug | ORAL_TABLET | Freq: Every day | ORAL | Status: DC

## 2014-11-23 NOTE — Progress Notes (Signed)
Patient ID: Morgan Fleming, female   DOB: 04-23-1981, 34 y.o.   MRN: [id number]   HPI  Morgan Fleming is a 34 y.o.-year-old female, initially referred by her ObGyn, Dr. Ena Dawley, for management of MNG. Patient is here accompanied by her husband, who offers part of the history.   Thyroid cancer history: At the beginning of 06/2014 she noticed a thyroid nodule in the mirror and saw her OB/GYN doctor who ordered a thyroid ultrasound.  - 07/14/2014: Thyroid U/S:  Right thyroid lobe : 56 x 26 x 35 mm. Dominant 32 x 22 x 35 mm nodule, inferior pole. 4 x 3 x 5 mm hypoechoic nodule, superior pole.  Left thyroid lobe: 45 x 14 x 13 mm. Three small cysts, all less than 5 mm maximum diameter.  Isthmus Thickness: 5 mm. No nodules visualized.  Lymphadenopathy: None visualized.  - 08/12/2014: FNA of the right thyroid nodule Adequacy Reason Satisfactory For Evaluation. Diagnosis THYROID, RIGHT, FINE NEEDLE ASPIRATION (SPECIMEN 1 OF 1, COLLECTED ON 08/12/2014): ATYPIA OF UNDETERMINED SIGNIFICANCE / FOLLICULAR LESION OF UNDETERMINED SIGNIFICANCE (BETHESDA CATEGORY III). SEE COMMENT. 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 - RESECTION MARGIN, NEGATIVE FOR ATYPIA OR MALIGNANCY. PLEASE SEE ONCOLOGY TEMPLATE FOR DETAILS. - TWO PARATHYROID GLANDS, NO EVIDENCE OF NEOPLASM. Microscopic Comment THYROID Specimen: Right thyroid gland. Procedure: Right hemi-thyroidectomy. Specimen Integrity (intact/fragmented): Intact. Tumor focality: Unifocal. Dominant tumor: Maximum tumor  size (cm): 3.0 cm. Tumor laterality: Right thyroid. Histologic type (including subtype and/or unique features as applicable): Follicular variant of papillary thyroid carcinoma. Tumor capsule: Not identified. Extrathyroidal extension: No. Margins: Negative. Lymph - Vascular invasion: Not identified. Capsular invasion with degree of invasion if present: N/A. Lymph nodes: # examined N/A; # positive; N/A. TNM code: pT2, pNX  - 09/25/2014: Completion thyroidectomy  She is still breastfeeding. She plans to breast-feed until at least October of this year. She has a 34year-old and a 458-monthld child.  I reviewed pt's thyroid tests - normal: 07/28/2014: TSH 0.916   Pt is on LT4 100 mcg (DAW Synthroid): - in am - with water  - b'fast >30 min after - stopped Calcium - Prenatal vitamin at bedtime  Pt  denies:  - + fatigue - + weight gain - 15 lbs, despite exercising more and breastfeeding - no heat intolerance/cold intolerance, + night sweats - no tremors - no palpitations - no anxiety/depression - no hyperdefecation/constipation - no dry skin - + hair loss  She has SOB and wheezing (asthma).   ROS: Constitutional: See history of present illness Eyes: no blurry vision, no xerophthalmia ENT: no sore throat, no nodules palpated in throat, no dysphagia/odynophagia, + hoarseness Cardiovascular: no CP/SOB/palpitations/+ leg swelling Respiratory: no cough/+ SOB and wheezing Gastrointestinal: + N/no V/D/C/ + heartburn  Musculoskeletal: no muscle/joint aches Skin: no rashes, + stretch marks  Neurological: no tremors/numbness/tingling/dizziness + low libido  I reviewed pt's medications, allergies, PMH, social hx, family hx, and changes were documented in the history of present illness. Otherwise, unchanged from my initial visit note.  Past Medical History  Diagnosis Date  . Anemia   . Asthma     daily and prn inhaler  . Breast feeding status  of mother     instructed to pump and  discard x 24 hrs. post-op  . Umbilical hernia   . Thyroid neoplasm    Past Surgical History  Procedure Laterality Date  . Skin tag removal  1996    right ear  . Dilation and curettage of uterus  03/19/2009    suction D & C, polypectomy  . Vacuum assisted vaginal delivery  10/06/2010    repair of second degree midline lac.  Marland Kitchen Umbilical hernia repair  05/29/2011    Procedure: HERNIA REPAIR UMBILICAL ADULT;  Surgeon: Belva Crome, MD;  Location: [facility];  Service: General;  Laterality: N/A;  Umbilical Hernia repair with mesh patch  . Cesarean section N/A 06/29/2014    Procedure: CESAREAN SECTION;  Surgeon: Crawford Givens, MD;  Location: [facility];  Service: Obstetrics;  Laterality: N/A;  . Thyroid lobectomy Right 09/10/2014    Procedure: RIGHT THYROID LOBECTOMY;  Surgeon: Armandina Gemma, MD;  Location: [facility];  Service: General;  Laterality: Right;  . Thyroidectomy N/A 09/25/2014    Procedure: COMPLETION THYROIDECTOMY ;  Surgeon: Armandina Gemma, MD;  Location: [facility];  Service: General;  Laterality: N/A;   History   Social History  . Marital Status: Married    Spouse Name: N/A  . Number of Children: 2   Occupational History  .  school counselor    Social History Main Topics  . Smoking status: Never Smoker   . Smokeless tobacco: Never Used  . Alcohol Use: No  . Drug Use: No  . Sexual Activity: Yes   Current Outpatient Prescriptions on File Prior to Visit  Medication Sig Dispense Refill  . albuterol (PROVENTIL HFA;VENTOLIN HFA) 108 (90 BASE) MCG/ACT inhaler Inhale 2 puffs into the lungs every 6 (six) hours as needed for wheezing or shortness of breath.    . calcium-vitamin D (OSCAL WITH D) 500-200 MG-UNIT per tablet Take 2 tablets by mouth 3 (three) times daily. 60 tablet 1  . ferrous sulfate 325 (65 FE) MG tablet Take 1 tablet (325 mg total) by mouth 2 (two) times daily with a meal. 30 tablet 3  . Fluticasone-Salmeterol (ADVAIR) 100-50 MCG/DOSE AEPB Inhale 1 puff into  the lungs every morning.     Marland Kitchen ibuprofen (ADVIL,MOTRIN) 600 MG tablet Take 1 tablet (600 mg total) by mouth every 6 (six) hours as needed for mild pain. 30 tablet 0  . ondansetron (ZOFRAN) 4 MG tablet Take 1 tablet (4 mg total) by mouth every 4 (four) hours as needed for nausea. 20 tablet 0  . ondansetron (ZOFRAN) 4 MG/2ML SOLN injection Inject 2 mLs (4 mg total) into the vein every 8 (eight) hours as needed for nausea. 2 mL 0  . oxyCODONE (OXY IR/ROXICODONE) 5 MG immediate release tablet Take 1-2 tablets (5-10 mg total) by mouth every 6 (six) hours as needed for moderate pain. 20 tablet 0  . oxyCODONE-acetaminophen (PERCOCET/ROXICET) 5-325 MG per tablet Take 1-2 tablets by mouth every 6 (six) hours as needed (for pain scale equal to or greater than 7). 30 tablet 0  . Prenatal Vit-Fe Fumarate-FA (PRENATAL PO) Take 1 tablet by mouth every morning.    . senna-docusate (SENOKOT-S) 8.6-50 MG per tablet Take 2 tablets by mouth at bedtime. 10 tablet 0  . simethicone (MYLICON) 80 MG chewable tablet Chew 1 tablet (80 mg total) by mouth 2 (two) times daily after a meal. 10 tablet 0  . SYNTHROID 100 MCG tablet Take 1 tablet (100 mcg  total) by mouth daily. 30 tablet 3   No current facility-administered medications on file prior to visit.   Allergies  Allergen Reactions  . Mometasone Furoate Swelling    Only lips and nose.  . Montelukast Sodium Nausea And Vomiting   No family history on file.  PE: BP 130/82 mmHg  Pulse 89  Temp(Src) 98.1 F (36.7 C) (Oral)  Resp 12  Wt 199 lb (90.266 kg)  SpO2 97% Wt Readings from Last 3 Encounters:  11/23/14 199 lb (90.266 kg)  09/25/14 188 lb (85.276 kg)  08/11/14 186 lb (84.369 kg)   Constitutional: overweight, in NAD Eyes: PERRLA, EOMI, no exophthalmos ENT: moist mucous membranes, thyroidectomy scar visible, but without discomfort at palpation, no cervical lymphadenopathy Cardiovascular: RRR, No MRG Respiratory: CTA B Gastrointestinal: abdomen soft,  NT, ND, BS+ Musculoskeletal: no deformities, strength intact in all 4;  Skin: moist, warm, no rashes Neurological: no tremor with outstretched hands, DTR normal in all 4  ASSESSMENT: 1. Follicular PTC  PLAN: 1. Follicular variant of PTC - 1. PTC  - she tolerated well her recent total thyroidectomy - no swelling, pain or erythema. She has occasional quivering of her voice.  - I had a long discussion with the patient and her husband about her recent diagnosis of papillary thyroid cancer.  reviewed the pathology  Underlined Uni-focality   it was  not encapsulated  did not spread to the lymph vessels  discussed prognosis  discussed further treatment with RAI  TSH goals (close to the LLN in the first year, then a little more lax).  - We discussed about the fact that the follicular variant of PTC will likely will not be considered a cancer in the next guidelines. This is great news. - We still decided to get the radioactive iodine treatment, since her tumor was 3 cm and didn't have a capsule, but we will do this with Thyrogen stimulation - patient would want to finish breast-feeding before having the RAI treatment, which would be fine, as there are no negative consequences of waiting a year after surgery to have radioactive iodine treatment - we discussed about Rx precautions after RAI tx  - we also discussed that we need to have her at least 2 months after finishing breast-feeding before giving her the I-131 - It is advisable not to get pregnant at least 6 months after the RAI treatment - I will see her back in 05/2015 - Will check TFTs today - If we need to change the dose, she needs a prescription sent locally, if we stay on the same dose, she needs a three-month supply sent to the mail-order pharmacy.  Office Visit on 11/23/2014  Component Date Value Ref Range Status  . TSH 11/23/2014 15.60* 0.35 - 4.50 uIU/mL Final  . Free T4 11/23/2014 0.70  0.60 - 1.60 ng/dL Final   TSH  elevated, will increase the dose of levothyroxine by 33%: to 137 mcg daily. We'll need to repeat thyroid test in 6 weeks.

## 2014-11-23 NOTE — Patient Instructions (Signed)
Please stop at the lab.  Please come back for a follow-up appointment in 6 months.  

## 2015-01-05 ENCOUNTER — Other Ambulatory Visit (INDEPENDENT_AMBULATORY_CARE_PROVIDER_SITE_OTHER): Payer: BC Managed Care – PPO

## 2015-01-05 DIAGNOSIS — E890 Postprocedural hypothyroidism: Secondary | ICD-10-CM | POA: Diagnosis not present

## 2015-01-05 LAB — TSH: TSH: 7.25 u[IU]/mL — AB (ref 0.35–4.50)

## 2015-01-05 LAB — T4, FREE: Free T4: 1.06 ng/dL (ref 0.60–1.60)

## 2015-01-07 ENCOUNTER — Other Ambulatory Visit: Payer: Self-pay | Admitting: Internal Medicine

## 2015-01-07 DIAGNOSIS — E890 Postprocedural hypothyroidism: Secondary | ICD-10-CM

## 2015-01-07 MED ORDER — SYNTHROID 150 MCG PO TABS: 150.0000 ug | ORAL_TABLET | Freq: Every day | ORAL | Status: DC

## 2015-02-19 ENCOUNTER — Encounter: Payer: Self-pay | Admitting: Internal Medicine

## 2015-02-19 ENCOUNTER — Other Ambulatory Visit (INDEPENDENT_AMBULATORY_CARE_PROVIDER_SITE_OTHER): Payer: BC Managed Care – PPO

## 2015-02-19 ENCOUNTER — Other Ambulatory Visit: Payer: Self-pay | Admitting: Internal Medicine

## 2015-02-19 DIAGNOSIS — E890 Postprocedural hypothyroidism: Secondary | ICD-10-CM | POA: Diagnosis not present

## 2015-02-19 LAB — T4, FREE: Free T4: 1.17 ng/dL (ref 0.60–1.60)

## 2015-02-19 LAB — TSH: TSH: 1.98 u[IU]/mL (ref 0.35–4.50)

## 2015-03-26 ENCOUNTER — Encounter: Payer: Self-pay | Admitting: Internal Medicine

## 2015-03-26 ENCOUNTER — Other Ambulatory Visit: Payer: Self-pay | Admitting: Internal Medicine

## 2015-03-26 ENCOUNTER — Other Ambulatory Visit (INDEPENDENT_AMBULATORY_CARE_PROVIDER_SITE_OTHER): Payer: BC Managed Care – PPO

## 2015-03-26 DIAGNOSIS — E890 Postprocedural hypothyroidism: Secondary | ICD-10-CM | POA: Diagnosis not present

## 2015-03-26 LAB — TSH: TSH: 3.03 u[IU]/mL (ref 0.35–4.50)

## 2015-03-26 LAB — T4, FREE: Free T4: 1.17 ng/dL (ref 0.60–1.60)

## 2015-03-26 MED ORDER — SYNTHROID 175 MCG PO TABS: 175.0000 ug | ORAL_TABLET | Freq: Every day | ORAL | Status: DC

## 2015-03-31 ENCOUNTER — Encounter: Payer: Self-pay | Admitting: Internal Medicine

## 2015-04-01 ENCOUNTER — Ambulatory Visit (INDEPENDENT_AMBULATORY_CARE_PROVIDER_SITE_OTHER): Payer: BC Managed Care – PPO | Admitting: Neurology

## 2015-04-01 ENCOUNTER — Encounter: Payer: Self-pay | Admitting: Neurology

## 2015-04-01 VITALS — BP 138/95 | HR 98 | Ht 64.0 in | Wt 203.0 lb

## 2015-04-01 DIAGNOSIS — G4484 Primary exertional headache: Secondary | ICD-10-CM | POA: Diagnosis not present

## 2015-04-01 DIAGNOSIS — R51 Headache: Secondary | ICD-10-CM

## 2015-04-01 DIAGNOSIS — R519 Headache, unspecified: Secondary | ICD-10-CM

## 2015-04-01 DIAGNOSIS — Z5181 Encounter for therapeutic drug level monitoring: Secondary | ICD-10-CM | POA: Diagnosis not present

## 2015-04-01 HISTORY — DX: Headache, unspecified: R51.9

## 2015-04-01 NOTE — Patient Instructions (Signed)
  We will check MRI of the brain.   Migraine Headache A migraine headache is an intense, throbbing pain on one or both sides of your head. A migraine can last for 30 minutes to several hours. CAUSES  The exact cause of a migraine headache is not always known. However, a migraine may be caused when nerves in the brain become irritated and release chemicals that cause inflammation. This causes pain. Certain things may also trigger migraines, such as:  Alcohol.  Smoking.  Stress.  Menstruation.  Aged cheeses.  Foods or drinks that contain nitrates, glutamate, aspartame, or tyramine.  Lack of sleep.  Chocolate.  Caffeine.  Hunger.  Physical exertion.  Fatigue.  Medicines used to treat chest pain (nitroglycerine), birth control pills, estrogen, and some blood pressure medicines. SIGNS AND SYMPTOMS  Pain on one or both sides of your head.  Pulsating or throbbing pain.  Severe pain that prevents daily activities.  Pain that is aggravated by any physical activity.  Nausea, vomiting, or both.  Dizziness.  Pain with exposure to bright lights, loud noises, or activity.  General sensitivity to bright lights, loud noises, or smells. Before you get a migraine, you may get warning signs that a migraine is coming (aura). An aura may include:  Seeing flashing lights.  Seeing bright spots, halos, or zigzag lines.  Having tunnel vision or blurred vision.  Having feelings of numbness or tingling.  Having trouble talking.  Having muscle weakness. DIAGNOSIS  A migraine headache is often diagnosed based on:  Symptoms.  Physical exam.  A CT scan or MRI of your head. These imaging tests cannot diagnose migraines, but they can help rule out other causes of headaches. TREATMENT Medicines may be given for pain and nausea. Medicines can also be given to help prevent recurrent migraines.  HOME CARE INSTRUCTIONS  Only take over-the-counter or prescription medicines for  pain or discomfort as directed by your health care provider. The use of long-term narcotics is not recommended.  Lie down in a dark, quiet room when you have a migraine.  Keep a journal to find out what may trigger your migraine headaches. For example, write down:  What you eat and drink.  How much sleep you get.  Any change to your diet or medicines.  Limit alcohol consumption.  Quit smoking if you smoke.  Get 7-9 hours of sleep, or as recommended by your health care provider.  Limit stress.  Keep lights dim if bright lights bother you and make your migraines worse. SEEK IMMEDIATE MEDICAL CARE IF:   Your migraine becomes severe.  You have a fever.  You have a stiff neck.  You have vision loss.  You have muscular weakness or loss of muscle control.  You start losing your balance or have trouble walking.  You feel faint or pass out.  You have severe symptoms that are different from your first symptoms. MAKE SURE YOU:   Understand these instructions.  Will watch your condition.  Will get help right away if you are not doing well or get worse.   This information is not intended to replace advice given to you by your health care provider. Make sure you discuss any questions you have with your health care provider.   Document Released: 06/05/2005 Document Revised: 06/26/2014 Document Reviewed: 02/10/2013 Elsevier Interactive Patient Education [employer].

## 2015-04-01 NOTE — Progress Notes (Signed)
Reason for visit: Headache  Referring physician: Dr. Margot Chimes is a 34 y.o. female  History of present illness:  Ms. Mcdevitt is a 34 year old right-handed black female with a history of headaches as been present for about 2 years. The patient indicates that the headaches come on with coughing or sneezing, sometimes with bending or stooping and coming up. The patient may have black spots in front of the eyes associated with the headache, and headache is throughout the head, associated with some neck discomfort. The patient indicates that the headaches are brief, lasting 5-30 seconds and then clearing. The patient denies any visual loss, or any nausea or vomiting. She reports no numbness or weakness on the face, arms, or legs. She denies any balance issues or difficulty controlling the bowels or the bladder. She has not had any blackout episodes or dizziness with the headache events. She comes to this office for an evaluation.  Past Medical History  Diagnosis Date  . Anemia   . Asthma     daily and prn inhaler  . Breast feeding status of mother     instructed to pump and discard x 24 hrs. post-op  . Umbilical hernia   . Thyroid neoplasm   . Cancer ([place])     thyroid  . Headache 04/01/2015    Traction headache    Past Surgical History  Procedure Laterality Date  . Skin tag removal  1996    right ear  . Dilation and curettage of uterus  03/19/2009    suction D & C, polypectomy  . Vacuum assisted vaginal delivery  10/06/2010    repair of second degree midline lac.  Marland Kitchen Umbilical hernia repair  05/29/2011    Procedure: HERNIA REPAIR UMBILICAL ADULT;  Surgeon: Belva Crome, MD;  Location: [facility];  Service: General;  Laterality: N/A;  Umbilical Hernia repair with mesh patch  . Cesarean section N/A 06/29/2014    Procedure: CESAREAN SECTION;  Surgeon: Crawford Givens, MD;  Location: [facility];  Service: Obstetrics;  Laterality: N/A;  . Thyroid lobectomy  Right 09/10/2014    Procedure: RIGHT THYROID LOBECTOMY;  Surgeon: Armandina Gemma, MD;  Location: [facility];  Service: General;  Laterality: Right;  . Thyroidectomy N/A 09/25/2014    Procedure: COMPLETION THYROIDECTOMY ;  Surgeon: Armandina Gemma, MD;  Location: [facility];  Service: General;  Laterality: N/A;    Family History  Problem Relation Age of Onset  . Hypertension Mother   . Bell's palsy Mother   . Thyroid disease Mother   . Hypertension Father   . Diabetes Father     Social history:  reports that she has never smoked. She has never used smokeless tobacco. She reports that she does not drink alcohol or use illicit drugs.  Medications:  Prior to Admission medications   Medication Sig Start Date End Date Taking? Authorizing Provider  Fluticasone-Salmeterol (ADVAIR) 100-50 MCG/DOSE AEPB Inhale 1 puff into the lungs every morning.    Yes Historical Provider, MD  SYNTHROID 175 MCG tablet Take 1 tablet (175 mcg total) by mouth daily before breakfast. 03/26/15  Yes Philemon Kingdom, MD      Allergies  Allergen Reactions  . Mometasone Furoate Swelling    Only lips and nose.  . Montelukast Sodium Nausea And Vomiting    ROS:  Out of a complete 14 system review of symptoms, the patient complains only of the following symptoms, and all other reviewed systems are negative.  Shortness  of breath, cough, wheezing Anemia, easy bruising Allergies, runny nose Headache  Blood pressure 138/95, pulse 98, height 5\' 4"  (1.626 m), weight 203 lb (92.08 kg), currently breastfeeding.  Physical Exam  General: The patient is alert and cooperative at the time of the examination.  Eyes: Pupils are equal, round, and reactive to light. Discs are flat bilaterally.  Neck: The neck is supple, no carotid bruits are noted.  Respiratory: The respiratory examination is clear.  Cardiovascular: The cardiovascular examination reveals a regular rate and rhythm, no obvious murmurs or rubs are noted.  Skin:  Extremities are without significant edema.  Neurologic Exam  Mental status: The patient is alert and oriented x 3 at the time of the examination. The patient has apparent normal recent and remote memory, with an apparently normal attention span and concentration ability.  Cranial nerves: Facial symmetry is present. There is good sensation of the face to pinprick and soft touch bilaterally. The strength of the facial muscles and the muscles to head turning and shoulder shrug are normal bilaterally. Speech is well enunciated, no aphasia or dysarthria is noted. Extraocular movements are full. Visual fields are full. The tongue is midline, and the patient has symmetric elevation of the soft palate. No obvious hearing deficits are noted.  Motor: The motor testing reveals 5 over 5 strength of all 4 extremities. Good symmetric motor tone is noted throughout.  Sensory: Sensory testing is intact to pinprick, soft touch, vibration sensation, and position sense on all 4 extremities. No evidence of extinction is noted.  Coordination: Cerebellar testing reveals good finger-nose-finger and heel-to-shin bilaterally.  Gait and station: Gait is normal. Tandem gait is normal. Romberg is negative. No drift is seen.  Reflexes: Deep tendon reflexes are symmetric and normal bilaterally. Toes are downgoing bilaterally.   Assessment/Plan:  1. Traction headache  The patient is reporting a headache syndrome consistent with a traction type headache. The patient will need to be evaluated for Arnold-Chiari malformation, or other intracranial abnormalities that may lead to headache. The patient will undergo MRI of the brain with and without gadolinium enhancement. She will follow-up following the study.  Jill Alexanders MD 04/01/2015 6:35 PM  [facility] [address] [place], [place] [address]  Phone [phone] Fax [phone]

## 2015-04-02 ENCOUNTER — Other Ambulatory Visit: Payer: BC Managed Care – PPO

## 2015-04-14 ENCOUNTER — Ambulatory Visit
Admission: RE | Admit: 2015-04-14 | Discharge: 2015-04-14 | Disposition: A | Discharge status: Home / Self Care | Payer: BC Managed Care – PPO | Source: Ambulatory Visit | Attending: Neurology | Admitting: Neurology

## 2015-04-14 DIAGNOSIS — G4484 Primary exertional headache: Secondary | ICD-10-CM

## 2015-04-14 MED ORDER — GADOBENATE DIMEGLUMINE 529 MG/ML IV SOLN
18.0000 mL | Freq: Once | INTRAVENOUS | Status: AC | PRN
  Administered 2015-04-14: 18 mL via INTRAVENOUS

## 2015-04-15 ENCOUNTER — Encounter: Payer: Self-pay | Admitting: Neurology

## 2015-04-15 ENCOUNTER — Telehealth: Payer: Self-pay | Admitting: Neurology

## 2015-04-15 DIAGNOSIS — Q0700 Arnold-Chiari syndrome without spina bifida or hydrocephalus: Secondary | ICD-10-CM | POA: Insufficient documentation

## 2015-04-15 HISTORY — DX: Arnold-Chiari syndrome without spina bifida or hydrocephalus: Q07.00

## 2015-04-15 NOTE — Telephone Encounter (Signed)
  I called the patient. 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 This likely explains her headache symptoms. I have recommended a surgical opinion from neurosurgery, the patient is amenable to this. The patient denies any progression of her headaches over the last 2 years, and she denies any balance issues or syncopal events.    MRI brain April 15 2015:  IMPRESSION:  Abnormal MRI brain (with and without) demonstrating: 1. 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 There is mass effect upon the posterior medulla and crowding at the level of the foramen magnum. Findings consistent with chiari malformation. No syringomyelia visualized down to the C4-5 level.  2. Remainder of brain parenchyma unremarkable.

## 2015-05-25 ENCOUNTER — Encounter: Payer: Self-pay | Admitting: Internal Medicine

## 2015-05-25 ENCOUNTER — Ambulatory Visit (INDEPENDENT_AMBULATORY_CARE_PROVIDER_SITE_OTHER): Payer: BC Managed Care – PPO | Admitting: Internal Medicine

## 2015-05-25 VITALS — BP 122/68 | HR 87 | Temp 97.3°F | Resp 12 | Wt 207.0 lb

## 2015-05-25 DIAGNOSIS — C73 Malignant neoplasm of thyroid gland: Secondary | ICD-10-CM

## 2015-05-25 DIAGNOSIS — E890 Postprocedural hypothyroidism: Secondary | ICD-10-CM

## 2015-05-25 DIAGNOSIS — Z833 Family history of diabetes mellitus: Secondary | ICD-10-CM | POA: Diagnosis not present

## 2015-05-25 LAB — TSH: TSH: 0.64 u[IU]/mL (ref 0.35–4.50)

## 2015-05-25 LAB — HEMOGLOBIN A1C: Hgb A1c MFr Bld: 5.2 % (ref 4.6–6.5)

## 2015-05-25 LAB — T4, FREE: Free T4: 1.17 ng/dL (ref 0.60–1.60)

## 2015-05-25 MED ORDER — SYNTHROID 175 MCG PO TABS: 175.0000 ug | ORAL_TABLET | Freq: Every day | ORAL | Status: DC

## 2015-05-25 NOTE — Progress Notes (Signed)
Patient ID: Morgan Fleming, female   DOB: Mar 06, 1981, 34 y.o.   MRN: [id number]   HPI  Morgan Fleming is a 34 y.o.-year-old female, initially referred by her ObGyn, Dr. Ena Dawley, for management of h/o FV of PTC and postsurgical hypothyroidism. Last visit 6 mo ago.  Thyroid cancer history: At the beginning of 06/2014 she noticed a thyroid nodule in the mirror and saw her OB/GYN doctor who ordered a thyroid ultrasound.  - 07/14/2014: Thyroid U/S:  Right thyroid lobe : 56 x 26 x 35 mm. Dominant 32 x 22 x 35 mm nodule, inferior pole. 4 x 3 x 5 mm hypoechoic nodule, superior pole.  Left thyroid lobe: 45 x 14 x 13 mm. Three small cysts, all less than 5 mm maximum diameter.  Isthmus Thickness: 5 mm. No nodules visualized.  Lymphadenopathy: None visualized.  - 08/12/2014: FNA of the right thyroid nodule Adequacy Reason Satisfactory For Evaluation. Diagnosis THYROID, RIGHT, FINE NEEDLE ASPIRATION (SPECIMEN 1 OF 1, COLLECTED ON 08/12/2014): ATYPIA OF UNDETERMINED SIGNIFICANCE / FOLLICULAR LESION OF UNDETERMINED SIGNIFICANCE (BETHESDA CATEGORY III). SEE COMMENT. Willeen Niece MD Pathologist, Electronic Signature (Case signed 08/13/2014) Specimen Clinical Information Dominant 32 x 22 x 72m nodule, inferior pole right lobe thyroid Source Thyroid, Fine Needle Aspiration, Right, (Specimen 1 of 1, collected on 08/12/2014)  - 09/10/2014: R thyroidectomy >> follicular variant of papillary thyroid cancer Thyroid, lobectomy, right - FOLLICULAR VARIANT OF PAPILLARY THYROID CARCINOMA, 3.0 CM, LIMITED TO THYROID PARENCHYMA. - RESECTION MARGIN, NEGATIVE FOR ATYPIA OR MALIGNANCY. PLEASE SEE ONCOLOGY TEMPLATE FOR DETAILS. - TWO PARATHYROID GLANDS, NO EVIDENCE OF NEOPLASM. Specimen: Right thyroid gland. Procedure: Right hemi-thyroidectomy. Specimen Integrity (intact/fragmented): Intact. Tumor focality: Unifocal. Dominant tumor: Maximum tumor size (cm): 3.0 cm. Tumor laterality: Right  thyroid. Histologic type (including subtype and/or unique features as applicable): Follicular variant of papillary thyroid carcinoma. Tumor capsule: Not identified. Extrathyroidal extension: No. Margins: Negative. Lymph - Vascular invasion: Not identified. Capsular invasion with degree of invasion if present: N/A. Lymph nodes: # examined N/A; # positive; N/A. TNM code: pT2, pNX  - 09/25/2014: Completion thyroidectomy  She was breastfeeding >> we delayed RAI tx. She has 2 small children.  I reviewed pt's thyroid tests:  Lab Results  Component Value Date   TSH 3.03 03/26/2015   TSH 1.98 02/19/2015   TSH 7.25* 01/05/2015   TSH 15.60* 11/23/2014   FREET4 1.17 03/26/2015   FREET4 1.17 02/19/2015   FREET4 1.06 01/05/2015   FREET4 0.70 11/23/2014  07/28/2014: TSH 0.916   Pt is on LT4 100 >> 175 mcg (DAW Synthroid): - in am - with water  - b'fast >30 min after - stopped Calcium - stopped Prenatal vitamin  - no iron, PPI  Pt  denies:  - + fatigue - + weight gain - no heat intolerance/cold intolerance - no tremors - no palpitations - no anxiety/depression - no hyperdefecation/constipation - + acne - see dermatologist  She has SOB and wheezing (asthma).  She also has HAs >> saw neurology >> brain MRI >> Arnold Chiari malformation. She will defer Sx for now.  Has a FH of DM. Wants to be checked for DM.  ROS: Constitutional: See history of present illness Eyes: no blurry vision, no xerophthalmia ENT: no sore throat, no nodules palpated in throat, no dysphagia/odynophagia, no hoarseness Cardiovascular: no CP/SOB/palpitations/leg swelling Respiratory: no cough/+ SOB and wheezing Gastrointestinal: no N/no V/D/C/heartburn  Musculoskeletal: no muscle/joint aches Skin: no rashes Neurological: no tremors/numbness/tingling/dizziness, + HA  I reviewed pt's medications, allergies, PMH, social  hx, family hx, and changes were documented in the history of present illness.  Otherwise, unchanged from my initial visit note.  Past Medical History  Diagnosis Date  . Anemia   . Asthma     daily and prn inhaler  . Breast feeding status of mother     instructed to pump and discard x 24 hrs. post-op  . Umbilical hernia   . Thyroid neoplasm   . Cancer ([place])     thyroid  . Headache 04/01/2015    Traction headache  . Arnold-Chiari malformation ([place]) 04/15/2015   Past Surgical History  Procedure Laterality Date  . Skin tag removal  1996    right ear  . Dilation and curettage of uterus  03/19/2009    suction D & C, polypectomy  . Vacuum assisted vaginal delivery  10/06/2010    repair of second degree midline lac.  Marland Kitchen Umbilical hernia repair  05/29/2011    Procedure: HERNIA REPAIR UMBILICAL ADULT;  Surgeon: Belva Crome, MD;  Location: [facility];  Service: General;  Laterality: N/A;  Umbilical Hernia repair with mesh patch  . Cesarean section N/A 06/29/2014    Procedure: CESAREAN SECTION;  Surgeon: Crawford Givens, MD;  Location: [facility];  Service: Obstetrics;  Laterality: N/A;  . Thyroid lobectomy Right 09/10/2014    Procedure: RIGHT THYROID LOBECTOMY;  Surgeon: Armandina Gemma, MD;  Location: [facility];  Service: General;  Laterality: Right;  . Thyroidectomy N/A 09/25/2014    Procedure: COMPLETION THYROIDECTOMY ;  Surgeon: Armandina Gemma, MD;  Location: [facility];  Service: General;  Laterality: N/A;   History   Social History  . Marital Status: Married    Spouse Name: N/A  . Number of Children: 2   Occupational History  .  school counselor    Social History Main Topics  . Smoking status: Never Smoker   . Smokeless tobacco: Never Used  . Alcohol Use: No  . Drug Use: No  . Sexual Activity: Yes   Current Outpatient Prescriptions on File Prior to Visit  Medication Sig Dispense Refill  . Fluticasone-Salmeterol (ADVAIR) 100-50 MCG/DOSE AEPB Inhale 1 puff into the lungs every morning.     Marland Kitchen SYNTHROID 175 MCG tablet Take 1 tablet (175 mcg total) by  mouth daily before breakfast. 60 tablet 1   No current facility-administered medications on file prior to visit.   Allergies  Allergen Reactions  . Mometasone Furoate Swelling    Only lips and nose.  . Montelukast Sodium Nausea And Vomiting   Family History  Problem Relation Age of Onset  . Hypertension Mother   . Bell's palsy Mother   . Thyroid disease Mother   . Hypertension Father   . Diabetes Father    PE: BP 122/68 mmHg  Pulse 87  Temp(Src) 97.3 F (36.3 C) (Oral)  Resp 12  Wt 207 lb (93.895 kg)  SpO2 96% Body mass index is 35.51 kg/(m^2). Wt Readings from Last 3 Encounters:  05/25/15 207 lb (93.895 kg)  04/01/15 203 lb (92.08 kg)  11/23/14 199 lb (90.266 kg)   Constitutional: overweight, in NAD Eyes: PERRLA, EOMI, no exophthalmos ENT: moist mucous membranes, thyroidectomy scar healed, no cervical lymphadenopathy Cardiovascular: RRR, No MRG Respiratory: CTA B Gastrointestinal: abdomen soft, NT, ND, BS+ Musculoskeletal: no deformities, strength intact in all 4;  Skin: moist, warm, no rashes Neurological: no tremor with outstretched hands, DTR normal in all 4  ASSESSMENT: 1. Follicular PTC  2. Postsurgical hypothyroidism  3. FH of  DM  PLAN: 1. Follicular variant of PTC - now 8 mo after total thyroidectomy - no swelling, pain or erythema. We are delaying her RAI tx as she is still b'feeding >> will stop in 06/2014. We discussed about waiting 2 mo after finishing B'feeding before RAI tx (~03-09/2014) to do the RAI tx to avoid breast toxicity.  - We discussed about the fact that the follicular variant of PTC is the most benign of the PTC types. Her tumor was not encapsulated and it was large (3 cm) and that is why I suggested to do RAI tx. We will do this with Thyrogen, though, rather than thyroid hh withdrawal. She agrees. Discussed radiation safety precautions and give a list of these. - It is advisable not to get pregnant at least 6 months after the RAI  treatment - I will see her back in 6 mo  2. Postop hypothyroidism - on Synthroid 175 mcg, taken correctly - Will check TFTs today - needs a refill when labs are back  3. FH of DM2 - would like me to check her for this - no Hgly sxs - will check hbA1c  Office Visit on 05/25/2015  Component Date Value Ref Range Status  . TSH 05/25/2015 0.64  0.35 - 4.50 uIU/mL Final  . Free T4 05/25/2015 1.17  0.60 - 1.60 ng/dL Final  . Hgb A1c MFr Bld 05/25/2015 5.2  4.6 - 6.5 % Final   Glycemic Control Guidelines for People with Diabetes:Non Diabetic:  <6%Goal of Therapy: <7%Additional Action Suggested:  >8%    Excellent labs!  No prediabetes or diabetes.

## 2015-05-25 NOTE — Patient Instructions (Addendum)
Please stop at the lab.  Continue Levothyroxine 175 mcg daily.  Take the thyroid hormone every day, with water, >30 minutes before breakfast, separated by >4 hours from: - acid reflux medications - calcium - iron - multivitamins.  Please return in 1.5 months with your sugar log.

## 2015-07-15 ENCOUNTER — Encounter: Payer: Self-pay | Admitting: Internal Medicine

## 2015-07-20 ENCOUNTER — Ambulatory Visit: Payer: BC Managed Care – PPO | Admitting: Internal Medicine

## 2015-07-27 ENCOUNTER — Encounter: Payer: Self-pay | Admitting: Internal Medicine

## 2015-07-28 ENCOUNTER — Other Ambulatory Visit: Payer: Self-pay | Admitting: Internal Medicine

## 2015-07-28 DIAGNOSIS — C73 Malignant neoplasm of thyroid gland: Secondary | ICD-10-CM

## 2015-08-25 ENCOUNTER — Encounter: Payer: Self-pay | Admitting: Internal Medicine

## 2015-08-26 ENCOUNTER — Telehealth: Payer: Self-pay | Admitting: *Deleted

## 2015-08-26 NOTE — Telephone Encounter (Signed)
Morgan Fleming, [facility] from [place] called with pt's RAI therapy appt. Called pt and advised her per the dates, times and location. March 28th, 29th and 30th at 8:45 am. Post scan April 7th at 7:40 am. Advised pt if there is a conflict in scheduling she needs to call Amber at [phone]. Pt voiced understanding. Be advised.

## 2015-09-08 ENCOUNTER — Encounter: Payer: Self-pay | Admitting: Internal Medicine

## 2015-09-14 ENCOUNTER — Ambulatory Visit (HOSPITAL_COMMUNITY)
Admission: RE | Admit: 2015-09-14 | Discharge: 2015-09-14 | Disposition: A | Discharge status: Home / Self Care | Payer: BC Managed Care – PPO | Source: Ambulatory Visit | Attending: Internal Medicine | Admitting: Internal Medicine

## 2015-09-14 DIAGNOSIS — C73 Malignant neoplasm of thyroid gland: Secondary | ICD-10-CM

## 2015-09-14 MED ORDER — THYROTROPIN ALFA 1.1 MG IM SOLR
INTRAMUSCULAR | Status: AC
  Filled 2015-09-14: qty 0.9

## 2015-09-14 MED ORDER — THYROTROPIN ALFA 1.1 MG IM SOLR
0.9000 mg | INTRAMUSCULAR | Status: AC
  Administered 2015-09-14: 0.9 mg via INTRAMUSCULAR

## 2015-09-15 ENCOUNTER — Ambulatory Visit (HOSPITAL_COMMUNITY)
Admission: RE | Admit: 2015-09-15 | Discharge: 2015-09-15 | Disposition: A | Discharge status: Home / Self Care | Payer: BC Managed Care – PPO | Source: Ambulatory Visit | Attending: Internal Medicine | Admitting: Internal Medicine

## 2015-09-15 DIAGNOSIS — Z9889 Other specified postprocedural states: Secondary | ICD-10-CM | POA: Diagnosis not present

## 2015-09-15 DIAGNOSIS — C73 Malignant neoplasm of thyroid gland: Secondary | ICD-10-CM | POA: Insufficient documentation

## 2015-09-15 MED ORDER — THYROTROPIN ALFA 1.1 MG IM SOLR
INTRAMUSCULAR | Status: AC
  Filled 2015-09-15: qty 0.9

## 2015-09-15 MED ORDER — THYROTROPIN ALFA 1.1 MG IM SOLR
0.9000 mg | INTRAMUSCULAR | Status: AC
  Administered 2015-09-15: 0.9 mg via INTRAMUSCULAR

## 2015-09-16 ENCOUNTER — Ambulatory Visit (HOSPITAL_COMMUNITY)
Admission: RE | Admit: 2015-09-16 | Discharge: 2015-09-16 | Disposition: A | Discharge status: Home / Self Care | Payer: BC Managed Care – PPO | Source: Ambulatory Visit | Attending: Internal Medicine | Admitting: Internal Medicine

## 2015-09-16 DIAGNOSIS — C73 Malignant neoplasm of thyroid gland: Secondary | ICD-10-CM | POA: Insufficient documentation

## 2015-09-16 LAB — HCG, SERUM, QUALITATIVE: Preg, Serum: NEGATIVE

## 2015-09-16 MED ORDER — SODIUM IODIDE I 131 CAPSULE
60.6000 | Freq: Once | INTRAVENOUS | Status: AC | PRN
  Administered 2015-09-16: 60.6 via ORAL

## 2015-09-22 ENCOUNTER — Encounter: Payer: Self-pay | Admitting: Internal Medicine

## 2015-09-24 ENCOUNTER — Ambulatory Visit (HOSPITAL_COMMUNITY)
Admission: RE | Admit: 2015-09-24 | Discharge: 2015-09-24 | Disposition: A | Discharge status: Home / Self Care | Payer: BC Managed Care – PPO | Source: Ambulatory Visit | Attending: Internal Medicine | Admitting: Internal Medicine

## 2015-09-24 DIAGNOSIS — C73 Malignant neoplasm of thyroid gland: Secondary | ICD-10-CM | POA: Diagnosis not present

## 2015-12-16 ENCOUNTER — Ambulatory Visit (INDEPENDENT_AMBULATORY_CARE_PROVIDER_SITE_OTHER): Payer: BC Managed Care – PPO | Admitting: Internal Medicine

## 2015-12-16 ENCOUNTER — Encounter: Payer: Self-pay | Admitting: Internal Medicine

## 2015-12-16 VITALS — BP 124/80 | HR 82 | Ht 63.0 in | Wt 184.0 lb

## 2015-12-16 DIAGNOSIS — C73 Malignant neoplasm of thyroid gland: Secondary | ICD-10-CM

## 2015-12-16 DIAGNOSIS — E890 Postprocedural hypothyroidism: Secondary | ICD-10-CM

## 2015-12-16 LAB — T4, FREE: Free T4: 1.28 ng/dL (ref 0.60–1.60)

## 2015-12-16 LAB — TSH: TSH: 0.38 u[IU]/mL (ref 0.35–4.50)

## 2015-12-16 NOTE — Patient Instructions (Signed)
Please stop at the lab.  Continue Synthroid 175 mcg daily.  Take the thyroid hormone every day, with water, at least 30 minutes before breakfast, separated by at least 4 hours from: - acid reflux medications - calcium - iron - multivitamins  Please return in 6 months.

## 2015-12-16 NOTE — Progress Notes (Signed)
Patient ID: Morgan Fleming, female   DOB: 12-22-1980, 35 y.o.   MRN: [id number]   HPI  Morgan Fleming is a 35 y.o.-year-old female, initially referred by her ObGyn, Dr. Ena Dawley, for management of h/o FV of PTC and postsurgical hypothyroidism. Last visit 6 mo ago.  She lost 23 lbs since last visit!  Thyroid cancer history: At the beginning of 06/2014 she noticed a thyroid nodule in the mirror and saw her OB/GYN doctor who ordered a thyroid ultrasound.  - 07/14/2014: Thyroid U/S:  Right thyroid lobe : 56 x 26 x 35 mm. Dominant 32 x 22 x 35 mm nodule, inferior pole. 4 x 3 x 5 mm hypoechoic nodule, superior pole.  Left thyroid lobe: 45 x 14 x 13 mm. Three small cysts, all less than 5 mm maximum diameter.  Isthmus Thickness: 5 mm. No nodules visualized.  Lymphadenopathy: None visualized.  - 08/12/2014: FNA of the right thyroid nodule Adequacy Reason Satisfactory For Evaluation. Diagnosis THYROID, RIGHT, FINE NEEDLE ASPIRATION (SPECIMEN 1 OF 1, COLLECTED ON 08/12/2014): ATYPIA OF UNDETERMINED SIGNIFICANCE / FOLLICULAR LESION OF UNDETERMINED SIGNIFICANCE (BETHESDA CATEGORY III). SEE COMMENT. 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 - RESECTION MARGIN, NEGATIVE FOR ATYPIA OR MALIGNANCY. PLEASE SEE ONCOLOGY TEMPLATE FOR DETAILS. - TWO PARATHYROID GLANDS, NO EVIDENCE OF NEOPLASM. Specimen: Right thyroid gland. Procedure: Right hemi-thyroidectomy. Specimen Integrity (intact/fragmented): Intact. Tumor focality: Unifocal. Dominant tumor: Maximum tumor size  (cm): 3.0 cm. Tumor laterality: Right thyroid. Histologic type (including subtype and/or unique features as applicable): Follicular variant of papillary thyroid carcinoma. Tumor capsule: Not identified. Extrathyroidal extension: No. Margins: Negative. Lymph - Vascular invasion: Not identified. Capsular invasion with degree of invasion if present: N/A. Lymph nodes: # examined N/A; # positive; N/A. TNM code: pT2, pNX  - 09/25/2014: Completion thyroidectomy  She was breastfeeding >> we delayed RAI tx (She has 2 small children) >> had this with Tg 09/16/2015 >> WBS 09/24/2015 >> no mets.  I reviewed pt's thyroid tests:  Lab Results  Component Value Date   TSH 0.64 05/25/2015   TSH 3.03 03/26/2015   TSH 1.98 02/19/2015   TSH 7.25* 01/05/2015   TSH 15.60* 11/23/2014   FREET4 1.17 05/25/2015   FREET4 1.17 03/26/2015   FREET4 1.17 02/19/2015   FREET4 1.06 01/05/2015   FREET4 0.70 11/23/2014  07/28/2014: TSH 0.916   Pt is on LT4 100 >> 175 mcg (DAW Synthroid): - in am - with water  - b'fast >30 min after - stopped Calcium - stopped Prenatal vitamin  - no iron, PPI  Pt  Mentions: - no fatigue - + weight loss (diet + exercise) - no heat intolerance/cold intolerance - no tremors - no palpitations - no anxiety/depression - no hyperdefecation/constipation  She has SOB and wheezing (asthma).  She also has HAs >> saw neurology >> brain MRI >> Arnold Chiari malformation. She will defer Sx for now.  Has a FH of DM. Wants to be checked for DM.  ROS: Constitutional: See history of present illness Eyes: no blurry vision, no xerophthalmia ENT: + sore throat, no nodules palpated in throat, no dysphagia/odynophagia, + hoarseness (allergies) Cardiovascular: no CP/SOB/palpitations/leg swelling Respiratory: no cough/SOB and  wheezing Gastrointestinal: no N/no V/D/C/heartburn  Musculoskeletal: no muscle/joint aches Skin: no rashes Neurological: no tremors/numbness/tingling/dizziness,  + HA  I reviewed pt's medications, allergies, PMH, social hx, family hx, and changes were documented in the history of present illness. Otherwise, unchanged from my initial visit note.  Past Medical History  Diagnosis Date  . Anemia   . Asthma     daily and prn inhaler  . Breast feeding status of mother     instructed to pump and discard x 24 hrs. post-op  . Umbilical hernia   . Thyroid neoplasm   . Cancer ([place])     thyroid  . Headache 04/01/2015    Traction headache  . Arnold-Chiari malformation ([place]) 04/15/2015   Past Surgical History  Procedure Laterality Date  . Skin tag removal  1996    right ear  . Dilation and curettage of uterus  03/19/2009    suction D & C, polypectomy  . Vacuum assisted vaginal delivery  10/06/2010    repair of second degree midline lac.  Marland Kitchen Umbilical hernia repair  05/29/2011    Procedure: HERNIA REPAIR UMBILICAL ADULT;  Surgeon: Belva Crome, MD;  Location: [facility];  Service: General;  Laterality: N/A;  Umbilical Hernia repair with mesh patch  . Cesarean section N/A 06/29/2014    Procedure: CESAREAN SECTION;  Surgeon: Crawford Givens, MD;  Location: [facility];  Service: Obstetrics;  Laterality: N/A;  . Thyroid lobectomy Right 09/10/2014    Procedure: RIGHT THYROID LOBECTOMY;  Surgeon: Armandina Gemma, MD;  Location: [facility];  Service: General;  Laterality: Right;  . Thyroidectomy N/A 09/25/2014    Procedure: COMPLETION THYROIDECTOMY ;  Surgeon: Armandina Gemma, MD;  Location[facility];  Service: General;  Laterality: N/A;   History   Social History  . Marital Status: Married    Spouse Name: N/A  . Number of Children: 2   Occupational History  .  school counselor    Social History Main Topics  . Smoking status: Never Smoker   . Smokeless tobacco: Never Used  . Alcohol Use: No  . Drug Use: No  . Sexual Activity: Yes   Current Outpatient Prescriptions on File Prior to Visit  Medication Sig Dispense Refill  . Albuterol Sulfate 108 (90  BASE) MCG/ACT AEPB Inhale into the lungs. 2 puffs as needed.    . Fluticasone-Salmeterol (ADVAIR) 100-50 MCG/DOSE AEPB Inhale 1 puff into the lungs every morning.     Marland Kitchen SYNTHROID 175 MCG tablet Take 1 tablet (175 mcg total) by mouth daily before breakfast. 90 tablet 1   No current facility-administered medications on file prior to visit.   Allergies  Allergen Reactions  . Mometasone Furoate Swelling    Only lips and nose.  . Montelukast Sodium Nausea And Vomiting   Family History  Problem Relation Age of Onset  . Hypertension Mother   . Bell's palsy Mother   . Thyroid disease Mother   . Hypertension Father   . Diabetes Father    PE: BP 124/80 mmHg  Pulse 82  Ht '5\' 3"'  (1.6 m)  Wt 184 lb (83.462 kg)  BMI 32.60 kg/m2  SpO2 97%  LMP 12/14/2015 Body mass index is 32.6 kg/(m^2). Wt Readings from Last 3 Encounters:  12/16/15 184 lb (83.462 kg)  05/25/15 207 lb (93.895 kg)  04/01/15 203 lb (92.08 kg)   Constitutional: overweight, in NAD Eyes: PERRLA, EOMI, no exophthalmos ENT: moist mucous membranes, thyroidectomy scar healed, no cervical lymphadenopathy Cardiovascular: RRR, No  MRG Respiratory: CTA B Gastrointestinal: abdomen soft, NT, ND, BS+ Musculoskeletal: no deformities, strength intact in all 4;  Skin: moist, warm, no rashes Neurological: no tremor with outstretched hands, DTR normal in all 4  ASSESSMENT: 1. Follicular PTC  2. Postsurgical hypothyroidism  PLAN: 1. Follicular variant of PTC - s/p total thyroidectomy - no swelling, pain, dysesthesia, or erythema at the surgical site. We delayed her RAI tx as she was still b'feeding >> had this 3 mo ago. Post-tx WBS negative for mets.   - We again discussed about the fact that the follicular variant of PTC is the most benign of the PTC types. She is stage 1 b/c age. Her tumor was not encapsulated and it was large (3 cm) and that is why I suggested to do RAI tx (with Thyrogen, though, not hormone withdrawal). She stayed  in a hotel for 2 days after the tx. - She is contemplating another pregnancy >> advised her that it is advisable not to get pregnant at least 6 months after the RAI treatment - will check Tg + ATA >> explained the purpose of checking this as a tumor marker - I will see her back in 6 mo  2. Postop hypothyroidism - on Synthroid 175 mcg, taken correctly: every day, with water, at least 30 minutes before breakfast, separated by at least 4 hours from: - acid reflux medications - calcium - iron - multivitamins - Will check TFTs today  CC: Dr Harlow Asa  - needs a refill when labs are back  Office Visit on 12/16/2015  Component Date Value Ref Range Status  . Thyroglobulin 12/16/2015 0.1* 2.8 - 40.9 ng/mL Final   Comment: Thyroglobulin antibodies (TGAb) interfere with Thyroglobulin (TG) assays; therefore, Thyroglobulin antibody (TGAb) assay should always be performed in conjunction with a Thyroglobulin (TG) assay.   This test was performed using the Beckman Coulter chemiluminescent method.  Values obtained from different assay methods cannot be used interchangeably.  Thyroglobulin levels, regardless of value, should not be interpreted as absolute evidence of the presence or absence of disease.   . Thyroglobulin Ab 12/16/2015 <1  <2 IU/mL Final  . TSH 12/16/2015 0.38  0.35 - 4.50 uIU/mL Final  . Free T4 12/16/2015 1.28  0.60 - 1.60 ng/dL Final   Tg excellent. TSH close to LLN. Will recheck at next visit and needs a decrease in LT4 dose if TSH still low.

## 2015-12-17 LAB — THYROGLOBULIN ANTIBODY: Thyroglobulin Ab: <1 IU/mL (ref <2)

## 2015-12-17 LAB — THYROGLOBULIN LEVEL: THYROGLOBULIN: 0.1 ng/mL — AB (ref 2.8–40.9)

## 2016-01-18 ENCOUNTER — Encounter: Payer: Self-pay | Admitting: Internal Medicine

## 2016-01-18 ENCOUNTER — Other Ambulatory Visit: Payer: Self-pay | Admitting: Internal Medicine

## 2016-01-19 ENCOUNTER — Other Ambulatory Visit: Payer: Self-pay | Admitting: Internal Medicine

## 2016-01-20 ENCOUNTER — Other Ambulatory Visit: Payer: Self-pay

## 2016-01-20 MED ORDER — LEVOTHYROXINE SODIUM 175 MCG PO TABS: ORAL_TABLET | ORAL | 0 refills | Status: DC

## 2016-01-26 ENCOUNTER — Other Ambulatory Visit: Payer: Self-pay

## 2016-01-26 MED ORDER — LEVOTHYROXINE SODIUM 175 MCG PO TABS: ORAL_TABLET | ORAL | 0 refills | Status: DC

## 2016-01-26 NOTE — Telephone Encounter (Signed)
Patient requested synthroid medication sent to pharmacy, sent it today.

## 2016-02-18 ENCOUNTER — Other Ambulatory Visit (HOSPITAL_BASED_OUTPATIENT_CLINIC_OR_DEPARTMENT_OTHER): Payer: Self-pay | Admitting: Neurosurgery

## 2016-02-18 DIAGNOSIS — G935 Compression of brain: Secondary | ICD-10-CM

## 2016-03-11 ENCOUNTER — Other Ambulatory Visit (HOSPITAL_BASED_OUTPATIENT_CLINIC_OR_DEPARTMENT_OTHER): Payer: BC Managed Care – PPO

## 2016-03-11 ENCOUNTER — Ambulatory Visit (HOSPITAL_BASED_OUTPATIENT_CLINIC_OR_DEPARTMENT_OTHER)
Admission: RE | Admit: 2016-03-11 | Discharge: 2016-03-11 | Disposition: A | Discharge status: Home / Self Care | Payer: BC Managed Care – PPO | Source: Ambulatory Visit | Attending: Neurosurgery | Admitting: Neurosurgery

## 2016-03-11 DIAGNOSIS — G935 Compression of brain: Secondary | ICD-10-CM

## 2016-03-11 DIAGNOSIS — G9519 Other vascular myelopathies: Secondary | ICD-10-CM | POA: Diagnosis not present

## 2016-03-16 ENCOUNTER — Encounter: Payer: Self-pay | Admitting: Internal Medicine

## 2016-04-01 ENCOUNTER — Other Ambulatory Visit (HOSPITAL_BASED_OUTPATIENT_CLINIC_OR_DEPARTMENT_OTHER): Payer: BC Managed Care – PPO

## 2016-04-06 ENCOUNTER — Other Ambulatory Visit (HOSPITAL_BASED_OUTPATIENT_CLINIC_OR_DEPARTMENT_OTHER): Payer: Self-pay | Admitting: Neurosurgery

## 2016-04-06 DIAGNOSIS — G935 Compression of brain: Secondary | ICD-10-CM

## 2016-04-07 ENCOUNTER — Ambulatory Visit (HOSPITAL_BASED_OUTPATIENT_CLINIC_OR_DEPARTMENT_OTHER)
Admission: RE | Admit: 2016-04-07 | Discharge: 2016-04-07 | Disposition: A | Discharge status: Home / Self Care | Payer: BC Managed Care – PPO | Source: Ambulatory Visit | Attending: Neurosurgery | Admitting: Neurosurgery

## 2016-04-07 DIAGNOSIS — G935 Compression of brain: Secondary | ICD-10-CM | POA: Diagnosis not present

## 2016-04-13 ENCOUNTER — Other Ambulatory Visit: Payer: Self-pay | Admitting: Neurosurgery

## 2016-04-17 ENCOUNTER — Other Ambulatory Visit: Payer: Self-pay | Admitting: Internal Medicine

## 2016-04-17 ENCOUNTER — Encounter: Payer: Self-pay | Admitting: Internal Medicine

## 2016-04-17 ENCOUNTER — Other Ambulatory Visit: Payer: Self-pay

## 2016-04-17 MED ORDER — LEVOTHYROXINE SODIUM 175 MCG PO TABS: ORAL_TABLET | ORAL | 0 refills | Status: DC

## 2016-05-17 ENCOUNTER — Encounter (HOSPITAL_COMMUNITY): Payer: Self-pay

## 2016-05-17 ENCOUNTER — Encounter (HOSPITAL_COMMUNITY)
Admission: RE | Admit: 2016-05-17 | Discharge: 2016-05-17 | Disposition: A | Discharge status: Home / Self Care | Payer: BC Managed Care – PPO | Source: Ambulatory Visit | Attending: Neurosurgery | Admitting: Neurosurgery

## 2016-05-17 DIAGNOSIS — Z01812 Encounter for preprocedural laboratory examination: Secondary | ICD-10-CM | POA: Insufficient documentation

## 2016-05-17 DIAGNOSIS — G935 Compression of brain: Secondary | ICD-10-CM | POA: Insufficient documentation

## 2016-05-17 DIAGNOSIS — Z0183 Encounter for blood typing: Secondary | ICD-10-CM | POA: Diagnosis not present

## 2016-05-17 HISTORY — DX: Hypothyroidism, unspecified: E03.9

## 2016-05-17 HISTORY — DX: Other specified postprocedural states: R11.2

## 2016-05-17 HISTORY — DX: Other specified postprocedural states: Z98.890

## 2016-05-17 LAB — CBC
HCT: 38.1 % (ref 36.0–46.0)
Hemoglobin: 12.9 g/dL (ref 12.0–15.0)
MCH: 31.3 pg (ref 26.0–34.0)
MCHC: 33.9 g/dL (ref 30.0–36.0)
MCV: 92.5 fL (ref 78.0–100.0)
PLATELETS: 247 10*3/uL (ref 150–400)
RBC: 4.12 MIL/uL (ref 3.87–5.11)
RDW: 12.9 % (ref 11.5–15.5)
WBC: 8 10*3/uL (ref 4.0–10.5)

## 2016-05-17 LAB — TYPE AND SCREEN
ABO/RH(D): O POS
ANTIBODY SCREEN: NEGATIVE

## 2016-05-17 LAB — SURGICAL PCR SCREEN
MRSA, PCR: NEGATIVE
STAPHYLOCOCCUS AUREUS: NEGATIVE

## 2016-05-17 LAB — BASIC METABOLIC PANEL
Anion gap: 7 (ref 5–15)
BUN: 12 mg/dL (ref 6–20)
CO2: 22 mmol/L (ref 22–32)
CREATININE: 0.83 mg/dL (ref 0.44–1.00)
Calcium: 8.9 mg/dL (ref 8.9–10.3)
Chloride: 109 mmol/L (ref 101–111)
GFR calc Af Amer: >60 mL/min (ref >60)
Glucose, Bld: 80 mg/dL (ref 65–99)
Potassium: 4.1 mmol/L (ref 3.5–5.1)
SODIUM: 138 mmol/L (ref 135–145)

## 2016-05-17 LAB — ABO/RH: ABO/RH(D): O POS

## 2016-05-17 LAB — HCG, SERUM, QUALITATIVE: Preg, Serum: NEGATIVE

## 2016-05-17 MED ORDER — CHLORHEXIDINE GLUCONATE CLOTH 2 % EX PADS: 6.0000 | MEDICATED_PAD | Freq: Once | CUTANEOUS | Status: DC

## 2016-05-17 NOTE — Pre-Procedure Instructions (Signed)
Morgan Fleming  05/17/2016      [facility] - [place], [place] - [address] [place] [address] Phone: [phone] Fax: [phone]  [facility] - [place], [place] - [address] AT [place] [address] [place] [address] Phone: [phone] Fax: [phone]    Your procedure is scheduled on Monday December 4.  Report to [facility] Admitting at 7:30 A.M.  Call this number if you have problems the morning of surgery:  [phone]   Remember:  Do not eat food or drink liquids after midnight.  Take these medicines the morning of surgery with A SIP OF WATER: levothyroxine (Synthroid), zyrtec if needed, flonase if needed, albuterol inhaler if needed (please bring inhaler to hospital)   7 days prior to surgery STOP taking any Excedrin migraine, Aspirin, Aleve, Naproxen, Ibuprofen, Motrin, Advil, Goody's, BC's, all herbal medications, fish oil, and all vitamins    Do not wear jewelry, make-up or nail polish.  Do not wear lotions, powders, or perfumes, or deoderant.  Do not shave 48 hours prior to surgery.  Men may shave face and neck.  Do not bring valuables to the hospital.  [facility] is not responsible for any belongings or valuables.  Contacts, dentures or bridgework may not be worn into surgery.  Leave your suitcase in the car.  After surgery it may be brought to your room.  For patients admitted to the hospital, discharge time will be determined by your treatment team.  Patients discharged the day of surgery will not be allowed to drive home.    Special instructions:    [facility]- Preparing For Surgery  Before surgery, you can play an important role. Because skin is not sterile, your skin needs to be as free of germs as possible. You can reduce the number of germs on your skin by washing with CHG (chlorahexidine gluconate) Soap before surgery.  CHG is an antiseptic  cleaner which kills germs and bonds with the skin to continue killing germs even after washing.  Please do not use if you have an allergy to CHG or antibacterial soaps. If your skin becomes reddened/irritated stop using the CHG.  Do not shave (including legs and underarms) for at least 48 hours prior to first CHG shower. It is OK to shave your face.  Please follow these instructions carefully.   1. Shower the NIGHT BEFORE SURGERY and the MORNING OF SURGERY with CHG.   2. If you chose to wash your hair, wash your hair first as usual with your normal shampoo.  3. After you shampoo, rinse your hair and body thoroughly to remove the shampoo.  4. Use CHG as you would any other liquid soap. You can apply CHG directly to the skin and wash gently with a scrungie or a clean washcloth.   5. Apply the CHG Soap to your body ONLY FROM THE NECK DOWN.  Do not use on open wounds or open sores. Avoid contact with your eyes, ears, mouth and genitals (private parts). Wash genitals (private parts) with your normal soap.  6. Wash thoroughly, paying special attention to the area where your surgery will be performed.  7. Thoroughly rinse your body with warm water from the neck down.  8. DO NOT shower/wash with your normal soap after using and rinsing off the CHG Soap.  9. Pat yourself dry with a CLEAN TOWEL.  10. Wear CLEAN PAJAMAS   11. Place CLEAN SHEETS on your bed the night of your first shower and DO NOT SLEEP WITH PETS.    Day of Surgery: Do not apply any deodorants/lotions. Please wear clean clothes to the hospital/surgery center.      Please read over the following fact sheets that you were given. MRSA Information

## 2016-05-17 NOTE — Progress Notes (Signed)
PCP: Donald Prose Pt denies cardiac hx/cardiac workup.   Pt denies chest pain, SOB, signs of infection.

## 2016-05-19 ENCOUNTER — Encounter (HOSPITAL_COMMUNITY): Payer: Self-pay | Admitting: Anesthesiology

## 2016-05-21 MED ORDER — CEFAZOLIN SODIUM-DEXTROSE 2-4 GM/100ML-% IV SOLN
2.0000 g | INTRAVENOUS | Status: AC
Start: 2016-05-22 — End: 2016-05-22
  Administered 2016-05-22: 2 g via INTRAVENOUS
  Filled 2016-05-21: qty 100

## 2016-05-22 ENCOUNTER — Inpatient Hospital Stay (HOSPITAL_COMMUNITY)
Admission: RE | Admit: 2016-05-22 | Discharge: 2016-05-25 | DRG: 027 | Disposition: A | Discharge status: Home / Self Care | Payer: BC Managed Care – PPO | Source: Ambulatory Visit | Attending: Neurosurgery | Admitting: Neurosurgery

## 2016-05-22 ENCOUNTER — Inpatient Hospital Stay (HOSPITAL_COMMUNITY): Payer: BC Managed Care – PPO | Admitting: Anesthesiology

## 2016-05-22 ENCOUNTER — Encounter (HOSPITAL_COMMUNITY)
Admission: RE | Disposition: A | Discharge status: Home / Self Care | Payer: Self-pay | Source: Ambulatory Visit | Attending: Neurosurgery

## 2016-05-22 ENCOUNTER — Encounter (HOSPITAL_COMMUNITY): Payer: Self-pay | Admitting: *Deleted

## 2016-05-22 DIAGNOSIS — Z888 Allergy status to other drugs, medicaments and biological substances status: Secondary | ICD-10-CM | POA: Diagnosis not present

## 2016-05-22 DIAGNOSIS — Z85850 Personal history of malignant neoplasm of thyroid: Secondary | ICD-10-CM

## 2016-05-22 DIAGNOSIS — G935 Compression of brain: Secondary | ICD-10-CM | POA: Diagnosis present

## 2016-05-22 DIAGNOSIS — J45909 Unspecified asthma, uncomplicated: Secondary | ICD-10-CM | POA: Diagnosis present

## 2016-05-22 DIAGNOSIS — Z8349 Family history of other endocrine, nutritional and metabolic diseases: Secondary | ICD-10-CM

## 2016-05-22 DIAGNOSIS — Q0700 Arnold-Chiari syndrome without spina bifida or hydrocephalus: Secondary | ICD-10-CM

## 2016-05-22 DIAGNOSIS — E890 Postprocedural hypothyroidism: Secondary | ICD-10-CM | POA: Diagnosis present

## 2016-05-22 HISTORY — PX: SUBOCCIPITAL CRANIECTOMY CERVICAL LAMINECTOMY: SHX5404

## 2016-05-22 LAB — GLUCOSE, CAPILLARY: Glucose-Capillary: 107 mg/dL — ABNORMAL HIGH (ref 65–99)

## 2016-05-22 SURGERY — SUBOCCIPITAL CRANIECTOMY CERVICAL LAMINECTOMY/DURAPLASTY
Anesthesia: General

## 2016-05-22 MED ORDER — HEMOSTATIC AGENTS (NO CHARGE) OPTIME
TOPICAL | Status: DC | PRN
  Administered 2016-05-22: 1 via TOPICAL

## 2016-05-22 MED ORDER — BACITRACIN ZINC 500 UNIT/GM EX OINT
TOPICAL_OINTMENT | CUTANEOUS | Status: AC
  Filled 2016-05-22: qty 28.35

## 2016-05-22 MED ORDER — LEVOTHYROXINE SODIUM 75 MCG PO TABS
175.0000 ug | ORAL_TABLET | Freq: Every day | ORAL | Status: DC
  Administered 2016-05-23 – 2016-05-25 (×3): 175 ug via ORAL
  Filled 2016-05-22 (×3): qty 1

## 2016-05-22 MED ORDER — LIDOCAINE HCL (CARDIAC) 20 MG/ML IV SOLN
INTRAVENOUS | Status: DC | PRN
  Administered 2016-05-22: 50 mg via INTRAVENOUS

## 2016-05-22 MED ORDER — PANTOPRAZOLE SODIUM 40 MG IV SOLR
40.0000 mg | Freq: Every day | INTRAVENOUS | Status: DC
  Administered 2016-05-23: 40 mg via INTRAVENOUS
  Filled 2016-05-22 (×2): qty 40

## 2016-05-22 MED ORDER — BUPIVACAINE HCL (PF) 0.25 % IJ SOLN
INTRAMUSCULAR | Status: AC
  Filled 2016-05-22: qty 30

## 2016-05-22 MED ORDER — MOMETASONE FURO-FORMOTEROL FUM 100-5 MCG/ACT IN AERO
2.0000 | INHALATION_SPRAY | Freq: Two times a day (BID) | RESPIRATORY_TRACT | Status: DC
  Administered 2016-05-22 – 2016-05-25 (×4): 2 via RESPIRATORY_TRACT
  Filled 2016-05-22: qty 8.8

## 2016-05-22 MED ORDER — FENTANYL CITRATE (PF) 100 MCG/2ML IJ SOLN
INTRAMUSCULAR | Status: AC
Start: 2016-05-22 — End: 2016-05-23
  Filled 2016-05-22: qty 2

## 2016-05-22 MED ORDER — 0.9 % SODIUM CHLORIDE (POUR BTL) OPTIME
TOPICAL | Status: DC | PRN
  Administered 2016-05-22 (×2): 1000 mL

## 2016-05-22 MED ORDER — FENTANYL CITRATE (PF) 100 MCG/2ML IJ SOLN
INTRAMUSCULAR | Status: AC
  Filled 2016-05-22: qty 2

## 2016-05-22 MED ORDER — FENTANYL CITRATE (PF) 100 MCG/2ML IJ SOLN
25.0000 ug | INTRAMUSCULAR | Status: DC | PRN
  Administered 2016-05-22 (×2): 50 ug via INTRAVENOUS

## 2016-05-22 MED ORDER — METOCLOPRAMIDE HCL 5 MG/ML IJ SOLN
INTRAMUSCULAR | Status: AC
  Filled 2016-05-22: qty 2

## 2016-05-22 MED ORDER — SUGAMMADEX SODIUM 200 MG/2ML IV SOLN
INTRAVENOUS | Status: DC | PRN
  Administered 2016-05-22: 156 mg via INTRAVENOUS

## 2016-05-22 MED ORDER — DEXAMETHASONE SODIUM PHOSPHATE 4 MG/ML IJ SOLN
4.0000 mg | Freq: Four times a day (QID) | INTRAMUSCULAR | Status: AC
  Administered 2016-05-23 – 2016-05-24 (×4): 4 mg via INTRAVENOUS
  Filled 2016-05-22 (×4): qty 1

## 2016-05-22 MED ORDER — HYDROMORPHONE HCL 1 MG/ML IJ SOLN
0.5000 mg | INTRAMUSCULAR | Status: DC | PRN
  Administered 2016-05-22: 0.5 mg via INTRAVENOUS
  Administered 2016-05-22 – 2016-05-25 (×15): 1 mg via INTRAVENOUS
  Filled 2016-05-22 (×19): qty 1

## 2016-05-22 MED ORDER — PROMETHAZINE HCL 25 MG PO TABS
12.5000 mg | ORAL_TABLET | ORAL | Status: DC | PRN
Start: 2016-05-22 — End: 2016-05-25

## 2016-05-22 MED ORDER — BUPIVACAINE HCL (PF) 0.25 % IJ SOLN
INTRAMUSCULAR | Status: DC | PRN
  Administered 2016-05-22: 10 mL

## 2016-05-22 MED ORDER — LACTATED RINGERS IV SOLN
INTRAVENOUS | Status: DC | PRN
Start: 2016-05-22 — End: 2016-05-22
  Administered 2016-05-22 (×2): via INTRAVENOUS

## 2016-05-22 MED ORDER — LABETALOL HCL 5 MG/ML IV SOLN: 10.0000 mg | INTRAVENOUS | Status: DC | PRN

## 2016-05-22 MED ORDER — LORATADINE 10 MG PO TABS
10.0000 mg | ORAL_TABLET | Freq: Every day | ORAL | Status: DC
  Administered 2016-05-23: 10 mg via ORAL
  Filled 2016-05-22 (×2): qty 1

## 2016-05-22 MED ORDER — HYDROCODONE-ACETAMINOPHEN 5-325 MG PO TABS
1.0000 | ORAL_TABLET | ORAL | Status: DC | PRN
  Administered 2016-05-22 – 2016-05-25 (×13): 1 via ORAL
  Filled 2016-05-22 (×13): qty 1

## 2016-05-22 MED ORDER — MICROFIBRILLAR COLL HEMOSTAT EX PADS
MEDICATED_PAD | CUTANEOUS | Status: DC | PRN
  Administered 2016-05-22: 1 via TOPICAL

## 2016-05-22 MED ORDER — GELATIN ABSORBABLE MT POWD
OROMUCOSAL | Status: DC | PRN
  Administered 2016-05-22: 13:00:00 via TOPICAL

## 2016-05-22 MED ORDER — DEXAMETHASONE SODIUM PHOSPHATE 10 MG/ML IJ SOLN
6.0000 mg | Freq: Four times a day (QID) | INTRAMUSCULAR | Status: AC
  Administered 2016-05-22 – 2016-05-23 (×4): 6 mg via INTRAVENOUS
  Filled 2016-05-22 (×4): qty 1

## 2016-05-22 MED ORDER — MIDAZOLAM HCL 2 MG/2ML IJ SOLN
INTRAMUSCULAR | Status: AC
  Filled 2016-05-22: qty 2

## 2016-05-22 MED ORDER — LACTATED RINGERS IV SOLN: INTRAVENOUS | Status: DC

## 2016-05-22 MED ORDER — LIDOCAINE-EPINEPHRINE (PF) 2 %-1:200000 IJ SOLN
INTRAMUSCULAR | Status: AC
  Filled 2016-05-22: qty 20

## 2016-05-22 MED ORDER — FENTANYL CITRATE (PF) 100 MCG/2ML IJ SOLN
INTRAMUSCULAR | Status: DC | PRN
Start: 2016-05-22 — End: 2016-05-22
  Administered 2016-05-22 (×2): 50 ug via INTRAVENOUS
  Administered 2016-05-22: 200 ug via INTRAVENOUS

## 2016-05-22 MED ORDER — ONDANSETRON HCL 4 MG/2ML IJ SOLN
INTRAMUSCULAR | Status: DC | PRN
  Administered 2016-05-22: 4 mg via INTRAVENOUS

## 2016-05-22 MED ORDER — PROPOFOL 10 MG/ML IV BOLUS
INTRAVENOUS | Status: AC
  Filled 2016-05-22: qty 20

## 2016-05-22 MED ORDER — ROCURONIUM BROMIDE 100 MG/10ML IV SOLN
INTRAVENOUS | Status: DC | PRN
  Administered 2016-05-22 (×3): 10 mg via INTRAVENOUS
  Administered 2016-05-22: 20 mg via INTRAVENOUS
  Administered 2016-05-22: 50 mg via INTRAVENOUS

## 2016-05-22 MED ORDER — METHOCARBAMOL 1000 MG/10ML IJ SOLN
1000.0000 mg | Freq: Four times a day (QID) | INTRAVENOUS | Status: DC | PRN
  Administered 2016-05-22: 1000 mg via INTRAVENOUS
  Filled 2016-05-22 (×3): qty 10

## 2016-05-22 MED ORDER — THROMBIN 5000 UNITS EX SOLR
CUTANEOUS | Status: DC | PRN
  Administered 2016-05-22: 10000 [IU] via TOPICAL

## 2016-05-22 MED ORDER — THROMBIN 5000 UNITS EX SOLR
CUTANEOUS | Status: AC
  Filled 2016-05-22: qty 15000

## 2016-05-22 MED ORDER — ONDANSETRON HCL 4 MG/2ML IJ SOLN
4.0000 mg | INTRAMUSCULAR | Status: DC | PRN
  Administered 2016-05-23 – 2016-05-24 (×4): 4 mg via INTRAVENOUS
  Filled 2016-05-22 (×4): qty 2

## 2016-05-22 MED ORDER — METOCLOPRAMIDE HCL 5 MG/ML IJ SOLN
10.0000 mg | Freq: Once | INTRAMUSCULAR | Status: AC | PRN
  Administered 2016-05-22: 10 mg via INTRAVENOUS

## 2016-05-22 MED ORDER — ALBUTEROL SULFATE (2.5 MG/3ML) 0.083% IN NEBU: 2.5000 mg | INHALATION_SOLUTION | Freq: Four times a day (QID) | RESPIRATORY_TRACT | Status: DC | PRN

## 2016-05-22 MED ORDER — MEPERIDINE HCL 25 MG/ML IJ SOLN: 6.2500 mg | INTRAMUSCULAR | Status: DC | PRN

## 2016-05-22 MED ORDER — FLUTICASONE PROPIONATE 50 MCG/ACT NA SUSP: 1.0000 | Freq: Every day | NASAL | Status: DC | PRN

## 2016-05-22 MED ORDER — LIDOCAINE-EPINEPHRINE 1 %-1:100000 IJ SOLN
INTRAMUSCULAR | Status: DC | PRN
  Administered 2016-05-22: 10 mL

## 2016-05-22 MED ORDER — DEXAMETHASONE SODIUM PHOSPHATE 10 MG/ML IJ SOLN
INTRAMUSCULAR | Status: DC | PRN
  Administered 2016-05-22: 10 mg via INTRAVENOUS

## 2016-05-22 MED ORDER — MIDAZOLAM HCL 2 MG/2ML IJ SOLN
INTRAMUSCULAR | Status: DC | PRN
Start: 2016-05-22 — End: 2016-05-22
  Administered 2016-05-22: 2 mg via INTRAVENOUS

## 2016-05-22 MED ORDER — SODIUM CHLORIDE 0.9 % IR SOLN
Status: DC | PRN
  Administered 2016-05-22: 500 mL

## 2016-05-22 MED ORDER — DEXAMETHASONE SODIUM PHOSPHATE 4 MG/ML IJ SOLN
4.0000 mg | Freq: Three times a day (TID) | INTRAMUSCULAR | Status: DC
  Administered 2016-05-25: 4 mg via INTRAVENOUS
  Filled 2016-05-22 (×2): qty 1

## 2016-05-22 MED ORDER — CEFAZOLIN SODIUM-DEXTROSE 2-4 GM/100ML-% IV SOLN
2.0000 g | Freq: Three times a day (TID) | INTRAVENOUS | Status: AC
  Administered 2016-05-22 – 2016-05-23 (×2): 2 g via INTRAVENOUS
  Filled 2016-05-22 (×2): qty 100

## 2016-05-22 MED ORDER — PHENYLEPHRINE HCL 10 MG/ML IJ SOLN
INTRAMUSCULAR | Status: DC | PRN
  Administered 2016-05-22 (×2): 80 ug via INTRAVENOUS

## 2016-05-22 MED ORDER — PROPOFOL 10 MG/ML IV BOLUS
INTRAVENOUS | Status: DC | PRN
  Administered 2016-05-22: 50 mg via INTRAVENOUS
  Administered 2016-05-22: 180 mg via INTRAVENOUS

## 2016-05-22 MED ORDER — LACTATED RINGERS IV SOLN
INTRAVENOUS | Status: DC
  Administered 2016-05-22: 50 mL/h via INTRAVENOUS

## 2016-05-22 MED ORDER — ONDANSETRON HCL 4 MG PO TABS
4.0000 mg | ORAL_TABLET | ORAL | Status: DC | PRN
  Administered 2016-05-24: 4 mg via ORAL
  Filled 2016-05-22: qty 1

## 2016-05-22 MED ORDER — POTASSIUM CHLORIDE IN NACL 20-0.45 MEQ/L-% IV SOLN
INTRAVENOUS | Status: DC
  Administered 2016-05-22 – 2016-05-24 (×3): via INTRAVENOUS
  Filled 2016-05-22 (×6): qty 1000

## 2016-05-22 MED ORDER — SODIUM CHLORIDE 0.9 % IV SOLN
500.0000 mg | Freq: Two times a day (BID) | INTRAVENOUS | Status: DC
  Administered 2016-05-22 – 2016-05-25 (×6): 500 mg via INTRAVENOUS
  Filled 2016-05-22 (×9): qty 5

## 2016-05-22 MED ORDER — ASPIRIN-ACETAMINOPHEN-CAFFEINE 250-250-65 MG PO TABS
2.0000 | ORAL_TABLET | Freq: Four times a day (QID) | ORAL | Status: DC | PRN
  Filled 2016-05-22: qty 2

## 2016-05-22 MED ORDER — BACITRACIN ZINC 500 UNIT/GM EX OINT
TOPICAL_OINTMENT | CUTANEOUS | Status: DC | PRN
  Administered 2016-05-22: 1 via TOPICAL

## 2016-05-22 SURGICAL SUPPLY — 88 items
BAG DECANTER FOR FLEXI CONT (MISCELLANEOUS) ×2 IMPLANT
BENZOIN TINCTURE PRP APPL 2/3 (GAUZE/BANDAGES/DRESSINGS) ×2 IMPLANT
BLADE CLIPPER SURG (BLADE) ×2 IMPLANT
BLADE SURG 11 STRL SS (BLADE) ×2 IMPLANT
BLADE ULTRA TIP 2M (BLADE) IMPLANT
BUR ACORN 9.0 PRECISION (BURR) ×2 IMPLANT
CANISTER SUCT 3000ML PPV (MISCELLANEOUS) ×2 IMPLANT
CARTRIDGE OIL MAESTRO DRILL (MISCELLANEOUS) ×1 IMPLANT
CLIP TI MEDIUM 6 (CLIP) ×2 IMPLANT
CORDS BIPOLAR (ELECTRODE) ×2 IMPLANT
DECANTER SPIKE VIAL GLASS SM (MISCELLANEOUS) ×2 IMPLANT
DERMABOND ADVANCED (GAUZE/BANDAGES/DRESSINGS) ×1
DERMABOND ADVANCED .7 DNX12 (GAUZE/BANDAGES/DRESSINGS) ×1 IMPLANT
DIFFUSER DRILL AIR PNEUMATIC (MISCELLANEOUS) ×2 IMPLANT
DRAPE LAPAROTOMY 100X72 PEDS (DRAPES) ×2 IMPLANT
DRAPE MICROSCOPE LEICA (MISCELLANEOUS) ×2 IMPLANT
DRAPE POUCH INSTRU U-SHP 10X18 (DRAPES) ×2 IMPLANT
DRAPE SURG 17X23 STRL (DRAPES) ×2 IMPLANT
DRAPE WARM FLUID 44X44 (DRAPE) ×2 IMPLANT
DRSG OPSITE 4X5.5 SM (GAUZE/BANDAGES/DRESSINGS) ×2 IMPLANT
DURAGUARD 06CMX08CM ×2 IMPLANT
ELECT CAUTERY BLADE 6.4 (BLADE) ×2 IMPLANT
ELECT REM PT RETURN 9FT ADLT (ELECTROSURGICAL) ×2
ELECTRODE REM PT RTRN 9FT ADLT (ELECTROSURGICAL) ×1 IMPLANT
EVACUATOR 1/8 PVC DRAIN (DRAIN) IMPLANT
EVACUATOR SILICONE 100CC (DRAIN) IMPLANT
GAUZE SPONGE 4X4 12PLY STRL (GAUZE/BANDAGES/DRESSINGS) IMPLANT
GAUZE SPONGE 4X4 16PLY XRAY LF (GAUZE/BANDAGES/DRESSINGS) IMPLANT
GLOVE BIO SURGEON STRL SZ 6.5 (GLOVE) IMPLANT
GLOVE BIO SURGEON STRL SZ7 (GLOVE) IMPLANT
GLOVE BIO SURGEON STRL SZ7.5 (GLOVE) IMPLANT
GLOVE BIO SURGEON STRL SZ8 (GLOVE) ×2 IMPLANT
GLOVE BIO SURGEON STRL SZ8.5 (GLOVE) IMPLANT
GLOVE BIOGEL M 7.0 STRL (GLOVE) ×6 IMPLANT
GLOVE BIOGEL M 8.0 STRL (GLOVE) IMPLANT
GLOVE BIOGEL PI IND STRL 7.5 (GLOVE) ×3 IMPLANT
GLOVE BIOGEL PI INDICATOR 7.5 (GLOVE) ×3
GLOVE ECLIPSE 6.5 STRL STRAW (GLOVE) IMPLANT
GLOVE ECLIPSE 7.0 STRL STRAW (GLOVE) IMPLANT
GLOVE ECLIPSE 7.5 STRL STRAW (GLOVE) IMPLANT
GLOVE ECLIPSE 8.0 STRL XLNG CF (GLOVE) IMPLANT
GLOVE ECLIPSE 8.5 STRL (GLOVE) IMPLANT
GLOVE EXAM NITRILE LRG STRL (GLOVE) IMPLANT
GLOVE EXAM NITRILE XL STR (GLOVE) IMPLANT
GLOVE EXAM NITRILE XS STR PU (GLOVE) IMPLANT
GLOVE INDICATOR 6.5 STRL GRN (GLOVE) IMPLANT
GLOVE INDICATOR 7.0 STRL GRN (GLOVE) IMPLANT
GLOVE INDICATOR 7.5 STRL GRN (GLOVE) IMPLANT
GLOVE INDICATOR 8.0 STRL GRN (GLOVE) IMPLANT
GLOVE INDICATOR 8.5 STRL (GLOVE) ×4 IMPLANT
GLOVE OPTIFIT SS 8.0 STRL (GLOVE) ×2 IMPLANT
GLOVE SS N UNI LF 7.0 STRL (GLOVE) ×4 IMPLANT
GLOVE SURG SS PI 6.5 STRL IVOR (GLOVE) IMPLANT
GOWN STRL REUS W/ TWL LRG LVL3 (GOWN DISPOSABLE) ×3 IMPLANT
GOWN STRL REUS W/ TWL XL LVL3 (GOWN DISPOSABLE) ×3 IMPLANT
GOWN STRL REUS W/TWL 2XL LVL3 (GOWN DISPOSABLE) IMPLANT
GOWN STRL REUS W/TWL LRG LVL3 (GOWN DISPOSABLE) ×3
GOWN STRL REUS W/TWL XL LVL3 (GOWN DISPOSABLE) ×3
HEMOSTAT POWDER KIT SURGIFOAM (HEMOSTASIS) ×2 IMPLANT
HEMOSTAT SURGICEL 2X14 (HEMOSTASIS) ×4 IMPLANT
KIT BASIN OR (CUSTOM PROCEDURE TRAY) ×2 IMPLANT
KIT ROOM TURNOVER OR (KITS) ×2 IMPLANT
NEEDLE HYPO 22GX1.5 SAFETY (NEEDLE) ×2 IMPLANT
NS IRRIG 1000ML POUR BTL (IV SOLUTION) ×4 IMPLANT
OIL CARTRIDGE MAESTRO DRILL (MISCELLANEOUS) ×2
PACK LAMINECTOMY NEURO (CUSTOM PROCEDURE TRAY) ×2 IMPLANT
PAD ARMBOARD 7.5X6 YLW CONV (MISCELLANEOUS) ×6 IMPLANT
PATTIES SURGICAL 1/4 X 3 (GAUZE/BANDAGES/DRESSINGS) IMPLANT
RUBBERBAND STERILE (MISCELLANEOUS) ×4 IMPLANT
SEALANT ADHERUS EXTEND TIP (MISCELLANEOUS) ×2 IMPLANT
SPONGE LAP 4X18 X RAY DECT (DISPOSABLE) IMPLANT
SPONGE SURGIFOAM ABS GEL SZ50 (HEMOSTASIS) ×2 IMPLANT
STAPLER SKIN PROX WIDE 3.9 (STAPLE) ×2 IMPLANT
STRIP CLOSURE SKIN 1/2X4 (GAUZE/BANDAGES/DRESSINGS) ×2 IMPLANT
SUT ETHILON 2 0 FS 18 (SUTURE) IMPLANT
SUT ETHILON 3 0 FSL (SUTURE) ×2 IMPLANT
SUT NURALON 4 0 TR CR/8 (SUTURE) ×2 IMPLANT
SUT PROLENE 6 0 BV (SUTURE) IMPLANT
SUT VIC AB 1 CT1 18XBRD ANBCTR (SUTURE) ×2 IMPLANT
SUT VIC AB 1 CT1 8-18 (SUTURE) ×2
SUT VIC AB 2-0 CT1 18 (SUTURE) ×4 IMPLANT
SUT VIC AB 3-0 SH 8-18 (SUTURE) ×4 IMPLANT
SYR CONTROL 10ML LL (SYRINGE) ×2 IMPLANT
TOWEL OR 17X24 6PK STRL BLUE (TOWEL DISPOSABLE) ×2 IMPLANT
TOWEL OR 17X26 10 PK STRL BLUE (TOWEL DISPOSABLE) ×2 IMPLANT
TRAY FOLEY W/METER SILVER 16FR (SET/KITS/TRAYS/PACK) ×2 IMPLANT
UNDERPAD 30X30 (UNDERPADS AND DIAPERS) IMPLANT
WATER STERILE IRR 1000ML POUR (IV SOLUTION) ×2 IMPLANT

## 2016-05-22 NOTE — Op Note (Signed)
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  EBL: Minimal  History of present illness: Patient is a very pleasant 35 year old female who presented with intractable headaches and some occasional division dizziness workup has revealed Chiari I malformation with 2.5 cm tonsillar descension to the level of the C2 lamina. Due to patient's progression of clinical syndrome imaging findings have recommended suboccipital craniectomy and C1-C2 laminectomy to decompress Chiari malformation. I extensively reviewed the risks and benefits of this operation with her as well as perioperative course expectations of outcome alternatives of surgery and she understood and agreed to proceed forward.  Operative procedure: Patient brought into the or was induced under general anesthesia positioned prone in pins the neck in slight flexion the back side of her head neck was shaved entrapment prepped and draped in routine sterile fashion a midline incision was made from just off the inion down to just below the C2 spinous process after infiltration of 10 mL lidocaine with epi and Bovie left car was used to 10 subcutaneous tissues and subperiosteal dissections care on the C2 lamina and then the Bovie was turned way down to 25 to carry out the subperiosteal dissection around the foramen magnum and the C1 lamina. After adequate exposure been achieved I drilled off the sub-occiput and the C1 lamina and removed the foramen magnum with Kerrison rongeurs carried out the decompression as wide as I felt safe exposing the dura then opened and the dura and a Y fashion over the cerebellar hemispheres medially visualized the  cerebellar tonsils and opened up the dura to inferior to the tonsils and had enough decompression with complete laminectomy of C2. Then opened up the arachnoid and get good pulsatile CSF flow from underneath the tonsils and the cerebellum tonsils were well decompressed. I then cut a piece of Dura-Guard bovine pericardium it to the right size and fashion and sewed it with a running Nurolon after sewing the patch graft I did a Valsalva to confirmed watertight closure squirted the fibrin glue product around the suture line Gelfoam and Surgifoam were also overlaid across the suture line and the wounds and closed in layers with after Vicryl skin was closed running nylon wound was dressed patient recovered in stable condition. At the end of case all needle counts and sponge counts were correct.

## 2016-05-22 NOTE — H&P (Signed)
Morgan Fleming is an 35 y.o. female.   Chief Complaint: Headaches dizziness HPI: Patient is a 34 year old female is a long-standing headaches and workup has revealed a Chiari I malformation with tonsillar dissection down to C2. Patient been managing this effectively throughout the years however last couple years Progress we worse is been refractory to all forms of conservative treatment. 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 We've extensively gone over the risks and benefits of the procedure with the patient as well as perioperative course expectations of outcome alternatives of surgery and she understands and agrees to proceed forward.  Past Medical History:  Diagnosis Date  . Anemia   . Arnold-Chiari malformation ([place]) 04/15/2015  . Asthma    daily and prn inhaler  . Breast feeding status of mother    instructed to pump and discard x 24 hrs. post-op  . Cancer ([place])    thyroid  . Headache 04/01/2015   Traction headache  . Hypothyroidism   . PONV (postoperative nausea and vomiting)    vomiting  . Thyroid neoplasm   . Umbilical hernia     Past Surgical History:  Procedure Laterality Date  . CESAREAN SECTION N/A 06/29/2014   Procedure: CESAREAN SECTION;  Surgeon: Crawford Givens, MD;  Location: [facility];  Service: Obstetrics;  Laterality: N/A;  . DILATION AND CURETTAGE OF UTERUS  03/19/2009   suction D & C, polypectomy  . SKIN TAG REMOVAL  1996   right ear  . THYROID LOBECTOMY Right 09/10/2014   Procedure: RIGHT THYROID LOBECTOMY;  Surgeon: Armandina Gemma, MD;  Location: [facility];  Service: General;  Laterality: Right;  . THYROIDECTOMY N/A 09/25/2014   Procedure: COMPLETION THYROIDECTOMY ;  Surgeon: Armandina Gemma, MD;  Location: [facility];  Service: General;  Laterality: N/A;  . UMBILICAL HERNIA REPAIR   05/29/2011   Procedure: HERNIA REPAIR UMBILICAL ADULT;  Surgeon: Belva Crome, MD;  Location: [facility];  Service: General;  Laterality: N/A;  Umbilical Hernia repair with mesh patch  . VACUUM ASSISTED VAGINAL DELIVERY  10/06/2010   repair of second degree midline lac.    Family History  Problem Relation Age of Onset  . Hypertension Mother   . Bell's palsy Mother   . Thyroid disease Mother   . Hypertension Father   . Diabetes Father    Social History:  reports that she has never smoked. She has never used smokeless tobacco. She reports that she does not drink alcohol or use drugs.  Allergies:  Allergies  Allergen Reactions  . Mometasone Furoate Swelling    Only lips and nose.  . Montelukast Sodium Nausea And Vomiting    Medications Prior to Admission  Medication Sig Dispense Refill  . ACZONE 7.5 % GEL Apply to face once daily  3  . Albuterol Sulfate 108 (90 BASE) MCG/ACT AEPB Inhale 2 puffs into the lungs every 6 (six) hours as needed (shortness of breath).     Marland Kitchen aspirin-acetaminophen-caffeine (EXCEDRIN MIGRAINE) 250-250-65 MG tablet Take 2 tablets by mouth every 6 (six) hours as needed for headache.    . cetirizine (ZYRTEC) 10 MG tablet Take 10 mg by mouth daily as needed for allergies.    . fluticasone (FLONASE) 50 MCG/ACT nasal spray Place 1-2 sprays into both nostrils daily as needed for allergies or rhinitis.    . Fluticasone-Salmeterol (ADVAIR) 100-50  MCG/DOSE AEPB Inhale 1 puff into the lungs every morning.     Marland Kitchen levothyroxine (SYNTHROID) 175 MCG tablet TAKE 1 TABLET BY MOUTH DAILY BEFORE BREAKFAST 90 tablet 0  . tretinoin (RETIN-A) 0.025 % cream Apply to face once daily in the evening  3    No results found for this or any previous visit (from the past 48 hour(s)). No results found.  Review of Systems  Neurological: Positive for dizziness and headaches.  All other systems reviewed and are negative.   Blood pressure (!) 153/95, temperature 97.8  F (36.6 C), temperature source Oral, resp. rate 18, height 5' 3.5" (1.613 m), weight 78 kg (172 lb), SpO2 99 %, not currently breastfeeding. Physical Exam  Constitutional: She is oriented to person, place, and time. She appears well-developed and well-nourished.  HENT:  Head: Normocephalic and atraumatic.  Eyes: Pupils are equal, round, and reactive to light.  Neck: Normal range of motion.  GI: Soft. Bowel sounds are normal.  Neurological: She is alert and oriented to person, place, and time. She has normal strength. GCS eye subscore is 4. GCS verbal subscore is 5. GCS motor subscore is 6.  Patient is awake alert oriented 4 pupils are equal cranial nerves are intact strength is 5 out of 5 upper and lower extremities  Skin: Skin is warm and dry.     Assessment/Plan 35 year old female presents for suboccipital craniectomy C1-2 laminectomy for decompression of Chiari  Maeve Debord P, MD 05/22/2016, 10:14 AM

## 2016-05-22 NOTE — Anesthesia Preprocedure Evaluation (Addendum)
Anesthesia Evaluation  Patient identified by MRN, date of birth, ID band Patient awake    Reviewed: Allergy & Precautions, NPO status , Patient's Chart, lab work & pertinent test results  History of Anesthesia Complications (+) PONV  Airway Mallampati: II  TM Distance: >3 FB Neck ROM: Full    Dental no notable dental hx.    Pulmonary asthma ,    Pulmonary exam normal breath sounds clear to auscultation       Cardiovascular negative cardio ROS Normal cardiovascular exam Rhythm:Regular Rate:Normal     Neuro/Psych Arnold Chiari Malformation negative psych ROS   GI/Hepatic negative GI ROS, Neg liver ROS,   Endo/Other  Hypothyroidism   Renal/GU negative Renal ROS  negative genitourinary   Musculoskeletal negative musculoskeletal ROS (+)   Abdominal   Peds negative pediatric ROS (+)  Hematology negative hematology ROS (+)   Anesthesia Other Findings   Reproductive/Obstetrics negative OB ROS                             Anesthesia Physical Anesthesia Plan  ASA: II  Anesthesia Plan: General   Post-op Pain Management:    Induction: Intravenous  Airway Management Planned: Oral ETT and Video Laryngoscope Planned  Additional Equipment: Arterial line  Intra-op Plan:   Post-operative Plan: Extubation in OR  Informed Consent: I have reviewed the patients History and Physical, chart, labs and discussed the procedure including the risks, benefits and alternatives for the proposed anesthesia with the patient or authorized representative who has indicated his/her understanding and acceptance.   Dental advisory given  Plan Discussed with: CRNA  Anesthesia Plan Comments: (2 PIV's and Aline)       Anesthesia Quick Evaluation

## 2016-05-22 NOTE — Anesthesia Procedure Notes (Signed)
Procedure Name: Intubation Date/Time: 05/22/2016 11:14 AM Performed by: Eligha Bridegroom Pre-anesthesia Checklist: Patient identified, Emergency Drugs available, Suction available, Patient being monitored and Timeout performed Patient Re-evaluated:Patient Re-evaluated prior to inductionOxygen Delivery Method: Circle system utilized Preoxygenation: Pre-oxygenation with 100% oxygen Intubation Type: IV induction Ventilation: Mask ventilation without difficulty Laryngoscope Size: Glidescope Grade View: Grade II Tube type: Oral Tube size: 7.0 mm Airway Equipment and Method: Stylet and Video-laryngoscopy Placement Confirmation: ETT inserted through vocal cords under direct vision,  positive ETCO2 and breath sounds checked- equal and bilateral Secured at: 21 cm Tube secured with: Tape Dental Injury: Teeth and Oropharynx as per pre-operative assessment  Difficulty Due To: Difficult Airway- due to reduced neck mobility

## 2016-05-22 NOTE — Transfer of Care (Signed)
Immediate Anesthesia Transfer of Care Note  Patient: Morgan Fleming  Procedure(s) Performed: Procedure(s): CHIARI DECOMPRESSION CERVICAL ONE-TWO DECOMPRESSION LAMINECTOMY (N/A)  Patient Location: PACU  Anesthesia Type:General  Level of Consciousness: awake, alert  and oriented  Airway & Oxygen Therapy: Patient Spontanous Breathing and Patient connected to nasal cannula oxygen  Post-op Assessment: Report given to RN and Post -op Vital signs reviewed and stable  Post vital signs: Reviewed and stable  Last Vitals:  Vitals:   05/22/16 0802 05/22/16 1448  BP: (!) 153/95   Resp: 18   Temp: 36.6 C (P) 36.1 C    Last Pain:  Vitals:   05/22/16 1448  TempSrc:   PainSc: (P) Asleep      Patients Stated Pain Goal: 3 ([id number])  Complications: No apparent anesthesia complications

## 2016-05-22 NOTE — Anesthesia Postprocedure Evaluation (Signed)
Anesthesia Post Note  Patient: Morgan Fleming  Procedure(s) Performed: Procedure(s) (LRB): CHIARI DECOMPRESSION CERVICAL ONE-TWO DECOMPRESSION LAMINECTOMY (N/A)  Patient location during evaluation: PACU Anesthesia Type: General Level of consciousness: awake and alert Pain management: pain level controlled Vital Signs Assessment: post-procedure vital signs reviewed and stable Respiratory status: spontaneous breathing, nonlabored ventilation, respiratory function stable and patient connected to nasal cannula oxygen Cardiovascular status: blood pressure returned to baseline and stable Postop Assessment: no signs of nausea or vomiting Anesthetic complications: no    Last Vitals:  Vitals:   05/22/16 0802 05/22/16 1448  BP: (!) 153/95   Resp: 18   Temp: 36.6 C 36.1 C    Last Pain:  Vitals:   05/22/16 1518  TempSrc:   PainSc: Asleep    LLE Motor Response: Purposeful movement;Responds to commands (05/22/16 1518) LLE Sensation: Full sensation (05/22/16 1518) RLE Motor Response: Purposeful movement;Responds to commands (05/22/16 1518) RLE Sensation: Full sensation (05/22/16 1518)      [name]

## 2016-05-23 ENCOUNTER — Encounter (HOSPITAL_COMMUNITY): Payer: Self-pay | Admitting: Neurosurgery

## 2016-05-23 NOTE — Progress Notes (Signed)
Subjective: Patient reports Doing well headache slightly better  Objective: Vital signs in last 24 hours: Temp:  [97 F (36.1 C)-100 F (37.8 C)] 99.7 F (37.6 C) (12/05 0800) Pulse Rate:  [62-93] 66 (12/05 0800) Resp:  [7-27] 13 (12/05 0800) BP: (93-136)/(63-93) 122/84 (12/05 0800) SpO2:  [92 %-100 %] 98 % (12/05 0800) Arterial Line BP: (71-137)/(59-84) 91/76 (12/05 0200)  Intake/Output from previous day: 12/04 0701 - 12/05 0700 In: 3351.3 [I.V.:2981.3; IV Piggyback:370] Out: 1945 [Urine:1795; Blood:150] Intake/Output this shift: No intake/output data recorded.  Neurologically intact awake alert oriented strength out of 5  Lab Results: No results for input(s): WBC, HGB, HCT, PLT in the last 72 hours. BMET No results for input(s): NA, K, CL, CO2, GLUCOSE, BUN, CREATININE, CALCIUM in the last 72 hours.  Studies/Results: No results found.  Assessment/Plan: Mobilized day with physical and occupational therapy transfer to floor  LOS: 1 day     Keen Ewalt P 05/23/2016, 8:17 AM

## 2016-05-23 NOTE — Care Management Note (Signed)
Case Management Note  Patient Details  Name: Morgan Fleming MRN: [id number] Date of Birth: 05/15/1981  Subjective/Objective:  Pt admitted on 05/22/16 s/p suboccipital craniectomy and C1 and C2 decompressive laminectomy for Chiari decompression.  PTA, pt independent, lives with spouse.                    Action/Plan: Will follow for discharge planning as pt progresses.    Expected Discharge Date:                  Expected Discharge Plan:  Home/Self Care  In-House Referral:     Discharge planning Services  CM Consult  Post Acute Care Choice:    Choice offered to:     DME Arranged:    DME Agency:     HH Arranged:    HH Agency:     Status of Service:  In process, will continue to follow  If discussed at Long Length of Stay Meetings, dates discussed:    Additional Comments:  Reinaldo Raddle, RN, BSN  Trauma/Neuro ICU Case Manager [phone]

## 2016-05-24 MED ORDER — PANTOPRAZOLE SODIUM 40 MG PO TBEC
40.0000 mg | DELAYED_RELEASE_TABLET | Freq: Every day | ORAL | Status: DC
  Administered 2016-05-24: 40 mg via ORAL
  Filled 2016-05-24: qty 1

## 2016-05-24 NOTE — Progress Notes (Signed)
Tried to change pt's dressing, pt states that she prefers to have her dressing changed tomorrow.

## 2016-05-24 NOTE — Progress Notes (Signed)
Patient ID: Morgan Fleming, female   DOB: [birth year], 35 y.o.   MRN: [id number] Patient doing well improved neck pain  Awake alert oriented wound clean dry and intact  Continue to mobilize today probable discharge tomorrow continue to wean Decadron

## 2016-05-25 MED ORDER — METHOCARBAMOL 750 MG PO TABS: 750.0000 mg | ORAL_TABLET | Freq: Four times a day (QID) | ORAL | 1 refills | Status: DC

## 2016-05-25 MED ORDER — HYDROCODONE-ACETAMINOPHEN 5-325 MG PO TABS: 1.0000 | ORAL_TABLET | ORAL | 0 refills | Status: DC | PRN

## 2016-05-25 MED ORDER — DEXAMETHASONE 2 MG PO TABS: ORAL_TABLET | ORAL | 0 refills | Status: DC

## 2016-05-25 NOTE — Discharge Summary (Signed)
  Physician Discharge Summary  Patient ID: Morgan Fleming MRN: [id number] DOB/AGE: 01-08-1981 35 y.o.  Admit date: 05/22/2016 Discharge date: 05/25/2016  Admission Diagnoses:Chiari I malformation  Discharge Diagnoses: Same Active Problems:   Chiari I malformation [facility])   Discharged Condition: good  Hospital Course: 35 year old female with headaches and 2.5 cm tonsillar descension consistent with Chiari I malformation patient presented the hospital underwent suboccipital craniectomy C1-C2 laminectomy was so sewing of a dural patch graft postoperatively patient did very well recovered in the floor on the floor she was angling and voiding spontaneously pain was well-controlled and patient will be discharged with pain medication and scheduled steroid taper.  Consults: Significant Diagnostic Studies: Treatments: Suboccipital craniectomy C1-C2 laminectomy Discharge Exam: Blood pressure 100/63, pulse 69, temperature 98.7 F (37.1 C), temperature source Oral, resp. rate 18, height 5' 3.5" (1.613 m), weight 78 kg (172 lb), SpO2 96 %, not currently breastfeeding. Strength out of 5 wound clean dry and intact  Disposition: Home     Medication List    TAKE these medications   ACZONE 7.5 % Gel Generic drug:  Dapsone Apply to face once daily   Albuterol Sulfate 108 (90 Base) MCG/ACT Aepb Inhale 2 puffs into the lungs every 6 (six) hours as needed (shortness of breath).   aspirin-acetaminophen-caffeine 250-250-65 MG tablet Commonly known as:  EXCEDRIN MIGRAINE Take 2 tablets by mouth every 6 (six) hours as needed for headache.   cetirizine 10 MG tablet Commonly known as:  ZYRTEC Take 10 mg by mouth daily as needed for allergies.   dexamethasone 2 MG tablet Commonly known as:  DECADRON Take 1 tablet by mouth TID for 1 week then 1 po Bid for 1 week , the 1 po qd.   fluticasone 50 MCG/ACT nasal spray Commonly known as:  FLONASE Place 1-2 sprays into both nostrils daily as needed  for allergies or rhinitis.   Fluticasone-Salmeterol 100-50 MCG/DOSE Aepb Commonly known as:  ADVAIR Inhale 1 puff into the lungs every morning.   HYDROcodone-acetaminophen 5-325 MG tablet Commonly known as:  NORCO/VICODIN Take 1 tablet by mouth every 4 (four) hours as needed for moderate pain.   levothyroxine 175 MCG tablet Commonly known as:  SYNTHROID TAKE 1 TABLET BY MOUTH DAILY BEFORE BREAKFAST   tretinoin 0.025 % cream Commonly known as:  RETIN-A Apply to face once daily in the evening      Follow-up Information    Chucky Homes P, MD Follow up in 2 day(s).   Specialty:  Neurosurgery Contact information: [address] [place] [address] [phone]        Elaina Hoops, MD .   Specialty:  Neurosurgery Contact information: [address] [place] [address] [phone]           Signed: Elaina Hoops 05/25/2016, 9:13 AM

## 2016-05-25 NOTE — Discharge Instructions (Signed)
No lifting or bending no twisting no driving a riding a car unless she is calm back and forth to see me. Keep the incision clean dry and intact.

## 2016-05-25 NOTE — Progress Notes (Signed)
Discharge instructions reviewed with patient and mother. Encouraged to call for follow up appointment.

## 2016-05-25 NOTE — Care Management Note (Signed)
Case Management Note  Patient Details  Name: Morgan Fleming MRN: [id number] Date of Birth: 1981-05-02  Subjective/Objective:                    Action/Plan: Pt discharging home with orders for 3 in 1 and walker. [name] with [facility] DME notified and will deliver the equipment to the room. Pt decided not to get the shower chair. Will update the bedside RN.   Expected Discharge Date:                  Expected Discharge Plan:  Home/Self Care  In-House Referral:     Discharge planning Services  CM Consult  Post Acute Care Choice:  Durable Medical Equipment Choice offered to:  Patient  DME Arranged:  3-N-1, Walker rolling DME Agency:  [place]:    Anon Raices:     Status of Service:  Completed, signed off  If discussed at [place] of Stay Meetings, dates discussed:    Additional Comments:  Pollie Friar, RN 05/25/2016, 11:01 AM

## 2016-06-16 ENCOUNTER — Ambulatory Visit (INDEPENDENT_AMBULATORY_CARE_PROVIDER_SITE_OTHER): Payer: BC Managed Care – PPO | Admitting: Internal Medicine

## 2016-06-16 ENCOUNTER — Ambulatory Visit: Payer: BC Managed Care – PPO | Admitting: Internal Medicine

## 2016-06-16 ENCOUNTER — Encounter: Payer: Self-pay | Admitting: Internal Medicine

## 2016-06-16 VITALS — BP 118/82 | HR 80 | Temp 98.3°F | Resp 16 | Ht 63.0 in | Wt 165.0 lb

## 2016-06-16 DIAGNOSIS — E890 Postprocedural hypothyroidism: Secondary | ICD-10-CM

## 2016-06-16 DIAGNOSIS — C73 Malignant neoplasm of thyroid gland: Secondary | ICD-10-CM | POA: Diagnosis not present

## 2016-06-16 LAB — T4, FREE: FREE T4: 1.38 ng/dL (ref 0.60–1.60)

## 2016-06-16 LAB — TSH: TSH: 0.32 u[IU]/mL — AB (ref 0.35–4.50)

## 2016-06-16 MED ORDER — LEVOTHYROXINE SODIUM 150 MCG PO TABS: 150.0000 ug | ORAL_TABLET | Freq: Every day | ORAL | 1 refills | Status: DC

## 2016-06-16 NOTE — Patient Instructions (Signed)
Please stop at the lab.  Continue Synthroid 175 mcg daily.  Take the thyroid hormone every day, with water, at least 30 minutes before breakfast, separated by at least 4 hours from: - acid reflux medications - calcium - iron - multivitamins  Please return in 6 months.

## 2016-06-16 NOTE — Progress Notes (Signed)
Patient ID: Morgan Fleming, female   DOB: 30-Aug-1980, 35 y.o.   MRN: [id number]   HPI  [name] is a 35 y.o.-year-old female, initially referred by her ObGyn, Dr. Ena Dawley, returning for f/u for h/o FV of PTC and postsurgical hypothyroidism. Last visit 6 mo ago.  Since last visit, she had Chiari malformation Sx (05/22/2016) as this got worse. She was on high dose Dexamethasone, taperd to 1x a day and stopped 5 days ago. She feels better now, with less HAs.  Thyroid cancer history: At the beginning of 06/2014 she noticed a thyroid nodule in the mirror and saw her OB/GYN doctor who ordered a thyroid ultrasound.  - 07/14/2014: Thyroid U/S:  Right thyroid lobe : 56 x 26 x 35 mm. Dominant 32 x 22 x 35 mm nodule, inferior pole. 4 x 3 x 5 mm hypoechoic nodule, superior pole.  Left thyroid lobe: 45 x 14 x 13 mm. Three small cysts, all less than 5 mm maximum diameter.  Isthmus Thickness: 5 mm. No nodules visualized.  Lymphadenopathy: None visualized.  - 08/12/2014: FNA of the right thyroid nodule Adequacy Reason Satisfactory For Evaluation. Diagnosis THYROID, RIGHT, FINE NEEDLE ASPIRATION (SPECIMEN 1 OF 1, COLLECTED ON 08/12/2014): ATYPIA OF UNDETERMINED SIGNIFICANCE / FOLLICULAR LESION OF UNDETERMINED SIGNIFICANCE (BETHESDA CATEGORY III). SEE COMMENT. 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 - RESECTION MARGIN, NEGATIVE FOR ATYPIA OR MALIGNANCY. PLEASE SEE ONCOLOGY TEMPLATE FOR DETAILS. - TWO PARATHYROID GLANDS, NO EVIDENCE OF NEOPLASM. Specimen:  Right thyroid gland. Procedure: Right hemi-thyroidectomy. Specimen Integrity (intact/fragmented): Intact. Tumor focality: Unifocal. Dominant tumor: Maximum tumor size (cm): 3.0 cm. Tumor laterality: Right thyroid. Histologic type (including subtype and/or unique features as applicable): Follicular variant of papillary thyroid carcinoma. Tumor capsule: Not identified. Extrathyroidal extension: No. Margins: Negative. Lymph - Vascular invasion: Not identified. Capsular invasion with degree of invasion if present: N/A. Lymph nodes: # examined N/A; # positive; N/A. TNM code: pT2, pNX  - 09/25/2014: Completion thyroidectomy - She was breastfeeding >> we delayed RAI tx (She has 2 small children), but had this with Tg 09/16/2015 >> WBS 09/24/2015 >> no mets. Component     Latest Ref Rng & Units 12/16/2015  Thyroglobulin     2.8 - 40.9 ng/mL 0.1 (L)  Thyroglobulin Ab     <2 IU/mL <1    I reviewed pt's thyroid tests:  Lab Results  Component Value Date   TSH 0.38 12/16/2015   TSH 0.64 05/25/2015   TSH 3.03 03/26/2015   TSH 1.98 02/19/2015   TSH 7.25 (H) 01/05/2015   FREET4 1.28 12/16/2015   FREET4 1.17 05/25/2015   FREET4 1.17 03/26/2015   FREET4 1.17 02/19/2015   FREET4 1.06 01/05/2015  07/28/2014: TSH 0.916   Pt is on LT4 175 mcg (DAW Synthroid): - in am - with water  - b'fast >30 min after - on Calcium - no Prenatal vitamin  - no iron, PPI  Pt  Mentions: - no fatigue - + weight loss (diet + exercise) - 12 lbs since last visit! - no heat intolerance/cold intolerance - no tremors - no palpitations - no anxiety/depression - no hyperdefecation/constipation  She has SOB  and wheezing (asthma).  She also has HAs >> saw neurology >> brain MRI >> Arnold Chiari malformation. She will defer Sx for now.  ROS: Constitutional: See history of present illness Eyes: no blurry vision, no xerophthalmia ENT: no sore throat, no nodules palpated in throat, no dysphagia/odynophagia,  no hoarseness  Cardiovascular: no CP/SOB/palpitations/leg swelling Respiratory: no cough/SOB and wheezing Gastrointestinal: no N/V/D/C/heartburn  Musculoskeletal: no muscle/joint aches Skin: no rashes Neurological: no tremors/numbness/tingling/dizziness, + HA  I reviewed pt's medications, allergies, PMH, social hx, family hx, and changes were documented in the history of present illness. Otherwise, unchanged from my initial visit note.  Past Medical History:  Diagnosis Date  . Anemia   . Arnold-Chiari malformation ([place]) 04/15/2015  . Asthma    daily and prn inhaler  . Breast feeding status of mother    instructed to pump and discard x 24 hrs. post-op  . Cancer ([place])    thyroid  . Headache 04/01/2015   Traction headache  . Hypothyroidism   . PONV (postoperative nausea and vomiting)    vomiting  . Thyroid neoplasm   . Umbilical hernia    Past Surgical History:  Procedure Laterality Date  . CESAREAN SECTION N/A 06/29/2014   Procedure: CESAREAN SECTION;  Surgeon: Crawford Givens, MD;  Location: [facility];  Service: Obstetrics;  Laterality: N/A;  . DILATION AND CURETTAGE OF UTERUS  03/19/2009   suction D & C, polypectomy  . SKIN TAG REMOVAL  1996   right ear  . SUBOCCIPITAL CRANIECTOMY CERVICAL LAMINECTOMY N/A 05/22/2016   Procedure: CHIARI DECOMPRESSION CERVICAL ONE-TWO DECOMPRESSION LAMINECTOMY;  Surgeon: Kary Kos, MD;  Location: [facility];  Service: Neurosurgery;  Laterality: N/A;  . THYROID LOBECTOMY Right 09/10/2014   Procedure: RIGHT THYROID LOBECTOMY;  Surgeon: Armandina Gemma, MD;  Location: [facility];  Service: General;  Laterality: Right;  . THYROIDECTOMY N/A 09/25/2014   Procedure: COMPLETION THYROIDECTOMY ;  Surgeon: Armandina Gemma, MD;  Location: [facility];  Service: General;  Laterality: N/A;  . UMBILICAL HERNIA REPAIR  05/29/2011   Procedure: HERNIA REPAIR UMBILICAL ADULT;  Surgeon: Belva Crome, MD;  Location: [facility];  Service: General;  Laterality: N/A;  Umbilical  Hernia repair with mesh patch  . VACUUM ASSISTED VAGINAL DELIVERY  10/06/2010   repair of second degree midline lac.   History   Social History  . Marital Status: Married    Spouse Name: N/A  . Number of Children: 2   Occupational History  .  school counselor    Social History Main Topics  . Smoking status: Never Smoker   . Smokeless tobacco: Never Used  . Alcohol Use: No  . Drug Use: No  . Sexual Activity: Yes   Current Outpatient Prescriptions on File Prior to Visit  Medication Sig Dispense Refill  . ACZONE 7.5 % GEL Apply to face once daily  3  . Albuterol Sulfate 108 (90 BASE) MCG/ACT AEPB Inhale 2 puffs into the lungs every 6 (six) hours as needed (shortness of breath).     Marland Kitchen aspirin-acetaminophen-caffeine (EXCEDRIN MIGRAINE) 250-250-65 MG tablet Take 2 tablets by mouth every 6 (six) hours as needed for headache.    . cetirizine (ZYRTEC) 10 MG tablet Take 10 mg by mouth daily as needed for allergies.    Marland Kitchen dexamethasone (DECADRON) 2 MG tablet Take 1 tablet by mouth TID for 1 week then 1 po Bid for 1 week , the 1 po qd. 40 tablet 0  . fluticasone (FLONASE) 50 MCG/ACT nasal  spray Place 1-2 sprays into both nostrils daily as needed for allergies or rhinitis.    . Fluticasone-Salmeterol (ADVAIR) 100-50 MCG/DOSE AEPB Inhale 1 puff into the lungs every morning.     Marland Kitchen HYDROcodone-acetaminophen (NORCO/VICODIN) 5-325 MG tablet Take 1 tablet by mouth every 4 (four) hours as needed for moderate pain. 60 tablet 0  . levothyroxine (SYNTHROID) 175 MCG tablet TAKE 1 TABLET BY MOUTH DAILY BEFORE BREAKFAST 90 tablet 0  . methocarbamol (ROBAXIN-750) 750 MG tablet Take 1 tablet (750 mg total) by mouth 4 (four) times daily. 40 tablet 1  . tretinoin (RETIN-A) 0.025 % cream Apply to face once daily in the evening  3   No current facility-administered medications on file prior to visit.    Allergies  Allergen Reactions  . Mometasone Furoate Swelling    Only lips and nose.  . Montelukast Sodium  Nausea And Vomiting   Family History  Problem Relation Age of Onset  . Hypertension Mother   . Bell's palsy Mother   . Thyroid disease Mother   . Hypertension Father   . Diabetes Father    PE: BP 118/82   Pulse 80   Temp 98.3 F (36.8 C) (Oral)   Resp 16   Ht '5\' 3"'  (1.6 m)   Wt 165 lb (74.8 kg)   LMP 05/24/2016 (Exact Date)   BMI 29.23 kg/m  Body mass index is 29.23 kg/m. Wt Readings from Last 3 Encounters:  05/22/16 172 lb (78 kg)  05/17/16 172 lb (78 kg)  12/16/15 184 lb (83.5 kg)   Constitutional: overweight, in NAD Eyes: PERRLA, EOMI, no exophthalmos ENT: moist mucous membranes, thyroidectomy scar healed, no cervical lymphadenopathy Cardiovascular: RRR, No MRG Respiratory: CTA B Gastrointestinal: abdomen soft, NT, ND, BS+ Musculoskeletal: no deformities, strength intact in all 4;  Skin: moist, warm, no rashes Neurological: no tremor with outstretched hands, DTR normal in all 4  ASSESSMENT: 1. Follicular PTC  2. Postsurgical hypothyroidism  PLAN: 1. Follicular variant of PTC - s/p total thyroidectomy - no swelling, pain, dysesthesia, or erythema at the surgical site. We delayed her RAI tx as she was still b'feeding >> had this 3 mo ago. Post-tx WBS negative for mets.   - We again discussed about the fact that the follicular variant of PTC is the most benign of the PTC types. She is stage 1 b/c age. Her tumor was not encapsulated and it was large (3 cm) and that is why I suggested to do RAI tx (with Thyrogen, though, not hormone withdrawal).  - She is contemplating another pregnancy at end of next year. We discussed that since she is >6 mo after RAI >> there should not be any pbs from her ThyCA pov - will check Tg + ATA at next visit. Reviewed the last levels >> Tg 0.1 (great) - I will see her back in 6 mo  2. Postop hypothyroidism - on Synthroid 175 mcg - discussed about the need to take it every day, with water, at least 30 minutes before breakfast, separated  by at least 4 hours from: - acid reflux medications - calcium - iron - multivitamins - she is taking it correctly - Will check TFTs today - She plans to get pregnant later next year. Discussed that we will recheck TFTs, along with Tg + ATA and a thyroid U/S at next visit >> then will need to let me know when she gets pregnant to increase the LT4 dose and have her back for labs in  4 weeks after dose change.  Needs refills.  Office Visit on 06/16/2016  Component Date Value Ref Range Status  . TSH 06/16/2016 0.32* 0.35 - 4.50 uIU/mL Final  . Free T4 06/16/2016 1.38  0.60 - 1.60 ng/dL Final   Comment: Specimens from patients who are undergoing biotin therapy and /or ingesting biotin supplements may contain high levels of biotin.  The higher biotin concentration in these specimens interferes with this Free T4 assay.  Specimens that contain high levels  of biotin may cause false high results for this Free T4 assay.  Please interpret results in light of the total clinical presentation of the patient.    TSH is suppressed>> will need to decrease the Synthroid dose to 150 g and repeat test in 6 weeks.   CC: Dr Harlow Asa  Philemon Kingdom, MD PhD [facility] Endocrinology

## 2016-06-23 ENCOUNTER — Encounter: Payer: Self-pay | Admitting: Internal Medicine

## 2016-06-30 ENCOUNTER — Other Ambulatory Visit: Payer: Self-pay

## 2016-06-30 MED ORDER — SYNTHROID 150 MCG PO TABS: 150.0000 ug | ORAL_TABLET | Freq: Every day | ORAL | 1 refills | Status: DC

## 2016-07-03 ENCOUNTER — Other Ambulatory Visit: Payer: Self-pay

## 2016-08-29 ENCOUNTER — Other Ambulatory Visit (INDEPENDENT_AMBULATORY_CARE_PROVIDER_SITE_OTHER): Payer: BC Managed Care – PPO

## 2016-08-29 DIAGNOSIS — E890 Postprocedural hypothyroidism: Secondary | ICD-10-CM

## 2016-08-29 LAB — TSH: TSH: 0.42 u[IU]/mL (ref 0.35–4.50)

## 2016-08-29 LAB — T4, FREE: Free T4: 0.96 ng/dL (ref 0.60–1.60)

## 2016-10-11 ENCOUNTER — Other Ambulatory Visit: Payer: Self-pay

## 2016-10-11 ENCOUNTER — Encounter: Payer: Self-pay | Admitting: Internal Medicine

## 2016-10-11 MED ORDER — SYNTHROID 150 MCG PO TABS: 150.0000 ug | ORAL_TABLET | Freq: Every day | ORAL | 1 refills | Status: DC

## 2016-10-27 ENCOUNTER — Other Ambulatory Visit (HOSPITAL_BASED_OUTPATIENT_CLINIC_OR_DEPARTMENT_OTHER): Payer: Self-pay | Admitting: Neurosurgery

## 2016-10-27 DIAGNOSIS — G935 Compression of brain: Secondary | ICD-10-CM

## 2016-11-04 ENCOUNTER — Ambulatory Visit (HOSPITAL_BASED_OUTPATIENT_CLINIC_OR_DEPARTMENT_OTHER)
Admission: RE | Admit: 2016-11-04 | Discharge: 2016-11-04 | Disposition: A | Discharge status: Home / Self Care | Payer: BC Managed Care – PPO | Source: Ambulatory Visit | Attending: Neurosurgery | Admitting: Neurosurgery

## 2016-11-04 DIAGNOSIS — Z9889 Other specified postprocedural states: Secondary | ICD-10-CM | POA: Diagnosis not present

## 2016-11-04 DIAGNOSIS — J329 Chronic sinusitis, unspecified: Secondary | ICD-10-CM | POA: Diagnosis not present

## 2016-11-04 DIAGNOSIS — G935 Compression of brain: Secondary | ICD-10-CM

## 2016-12-26 ENCOUNTER — Ambulatory Visit (INDEPENDENT_AMBULATORY_CARE_PROVIDER_SITE_OTHER): Payer: BC Managed Care – PPO | Admitting: Internal Medicine

## 2016-12-26 VITALS — BP 112/68 | HR 67 | Wt 158.0 lb

## 2016-12-26 DIAGNOSIS — C73 Malignant neoplasm of thyroid gland: Secondary | ICD-10-CM

## 2016-12-26 DIAGNOSIS — E890 Postprocedural hypothyroidism: Secondary | ICD-10-CM

## 2016-12-26 LAB — TSH: TSH: 0.32 u[IU]/mL — ABNORMAL LOW (ref 0.35–4.50)

## 2016-12-26 LAB — T4, FREE: Free T4: 1.07 ng/dL (ref 0.60–1.60)

## 2016-12-26 NOTE — Patient Instructions (Signed)
Please stop at the lab.  Continue Synthroid 150 mcg daily.  Take the thyroid hormone every day, with water, at least 30 minutes before breakfast, separated by at least 4 hours from: - acid reflux medications - calcium - iron - multivitamins  Please come back for a follow-up appointment in 6 months.

## 2016-12-26 NOTE — Progress Notes (Signed)
Patient ID: Morgan Fleming, female   DOB: Mar 03, 1981, 36 y.o.   MRN: [id number]   HPI  Morgan Fleming is a 36 y.o.-year-old female, initially referred by her ObGyn, Dr. Ena Dawley, returning for f/u for h/o FV of PTC and postsurgical hypothyroidism. Last visit 6.5 mo ago.  Reviewed history: Thyroid cancer history: At the beginning of 06/2014 she noticed a thyroid nodule in the mirror and saw her OB/GYN doctor who ordered a thyroid ultrasound.  - 07/14/2014: Thyroid U/S:  Right thyroid lobe : 56 x 26 x 35 mm. Dominant 32 x 22 x 35 mm nodule, inferior pole. 4 x 3 x 5 mm hypoechoic nodule, superior pole.  Left thyroid lobe: 45 x 14 x 13 mm. Three small cysts, all less than 5 mm maximum diameter.  Isthmus Thickness: 5 mm. No nodules visualized.  Lymphadenopathy: None visualized.  - 08/12/2014: FNA of the right thyroid nodule Adequacy Reason Satisfactory For Evaluation. Diagnosis THYROID, RIGHT, FINE NEEDLE ASPIRATION (SPECIMEN 1 OF 1, COLLECTED ON 08/12/2014): ATYPIA OF UNDETERMINED SIGNIFICANCE / FOLLICULAR LESION OF UNDETERMINED SIGNIFICANCE (BETHESDA CATEGORY III). SEE COMMENT. Willeen Niece MD Pathologist, Electronic Signature (Case signed 08/13/2014) Specimen Clinical Information Dominant 32 x 22 x 97m nodule, inferior pole right lobe thyroid Source Thyroid, Fine Needle Aspiration, Right, (Specimen 1 of 1, collected on 08/12/2014)  - 09/10/2014: R thyroidectomy >> follicular variant of papillary thyroid cancer Thyroid, lobectomy, right - FOLLICULAR VARIANT OF PAPILLARY THYROID CARCINOMA, 3.0 CM, LIMITED TO THYROID PARENCHYMA. - RESECTION MARGIN, NEGATIVE FOR ATYPIA OR MALIGNANCY. PLEASE SEE ONCOLOGY TEMPLATE FOR DETAILS. - TWO PARATHYROID GLANDS, NO EVIDENCE OF NEOPLASM. Specimen: Right thyroid gland. Procedure: Right hemi-thyroidectomy. Specimen Integrity (intact/fragmented): Intact. Tumor focality: Unifocal. Dominant tumor: Maximum tumor size (cm): 3.0  cm. Tumor laterality: Right thyroid. Histologic type (including subtype and/or unique features as applicable): Follicular variant of papillary thyroid carcinoma. Tumor capsule: Not identified. Extrathyroidal extension: No. Margins: Negative. Lymph - Vascular invasion: Not identified. Capsular invasion with degree of invasion if present: N/A. Lymph nodes: # examined N/A; # positive; N/A. TNM code: pT2, pNX  - 09/25/2014: Completion thyroidectomy - As she was breast-feeding, we delayed RAI tx (She has 2 small children), but had this with Tg 09/16/2015 >> WBS 09/24/2015 >> no mets.  Component     Latest Ref Rng & Units 12/16/2015  Thyroglobulin     2.8 - 40.9 ng/mL 0.1 (L)  Thyroglobulin Ab     <2 IU/mL <1   I reviewed patient's thyroid tests: Lab Results  Component Value Date   TSH 0.42 08/29/2016   TSH 0.32 (L) 06/16/2016   TSH 0.38 12/16/2015   TSH 0.64 05/25/2015   TSH 3.03 03/26/2015   FREET4 0.96 08/29/2016   FREET4 1.38 06/16/2016   FREET4 1.28 12/16/2015   FREET4 1.17 05/25/2015   FREET4 1.17 03/26/2015  07/28/2014: TSH 0.916   Pt is on LT4 150 g (DAW Synthroid) - this was decreased at last visit due to suppressed TSH - in am - fasting - at least 30 min from b'fast - stopped Ca  no Fe, MVI, PPIs - not on Biotin  She continues to lose weight, which is intentional. She is doing a great job with this!  She has SOB and wheezing (asthma).    ROS: Constitutional: + Intentional weight loss, no fatigue, no subjective hyperthermia, no subjective hypothermia Eyes: no blurry vision, no xerophthalmia ENT: no sore throat, no nodules palpated in throat, no dysphagia, no odynophagia, no hoarseness Cardiovascular: no CP/no SOB/no  palpitations/no leg swelling Respiratory: no cough/no SOB/no wheezing Gastrointestinal: no N/no V/no D/no C/no acid reflux Musculoskeletal: no muscle aches/no joint aches Skin: no rashes, no hair loss Neurological: no tremors/no numbness/no  tingling/no dizziness  I reviewed pt's medications, allergies, PMH, social hx, family hx, and changes were documented in the history of present illness. Otherwise, unchanged from my initial visit note.  Past Medical History:  Diagnosis Date  . Anemia   . Arnold-Chiari malformation ([place]) 04/15/2015  . Asthma    daily and prn inhaler  . Breast feeding status of mother    instructed to pump and discard x 24 hrs. post-op  . Cancer ([place])    thyroid  . Headache 04/01/2015   Traction headache  . Hypothyroidism   . PONV (postoperative nausea and vomiting)    vomiting  . Thyroid neoplasm   . Umbilical hernia    Past Surgical History:  Procedure Laterality Date  . CESAREAN SECTION N/A 06/29/2014   Procedure: CESAREAN SECTION;  Surgeon: Crawford Givens, MD;  Location: [facility];  Service: Obstetrics;  Laterality: N/A;  . DILATION AND CURETTAGE OF UTERUS  03/19/2009   suction D & C, polypectomy  . SKIN TAG REMOVAL  1996   right ear  . SUBOCCIPITAL CRANIECTOMY CERVICAL LAMINECTOMY N/A 05/22/2016   Procedure: CHIARI DECOMPRESSION CERVICAL ONE-TWO DECOMPRESSION LAMINECTOMY;  Surgeon: Kary Kos, MD;  Location: [facility];  Service: Neurosurgery;  Laterality: N/A;  . THYROID LOBECTOMY Right 09/10/2014   Procedure: RIGHT THYROID LOBECTOMY;  Surgeon: Armandina Gemma, MD;  Location: [facility];  Service: General;  Laterality: Right;  . THYROIDECTOMY N/A 09/25/2014   Procedure: COMPLETION THYROIDECTOMY ;  Surgeon: Armandina Gemma, MD;  Location: [facility];  Service: General;  Laterality: N/A;  . UMBILICAL HERNIA REPAIR  05/29/2011   Procedure: HERNIA REPAIR UMBILICAL ADULT;  Surgeon: Belva Crome, MD;  Location: [place];  Service: General;  Laterality: N/A;  Umbilical Hernia repair with mesh patch  . VACUUM ASSISTED VAGINAL DELIVERY  10/06/2010   repair of second degree midline lac.   History   Social History  . Marital Status: Married    Spouse Name: N/A  . Number of Children: 2   Occupational  History  .  school counselor    Social History Main Topics  . Smoking status: Never Smoker   . Smokeless tobacco: Never Used  . Alcohol Use: No  . Drug Use: No  . Sexual Activity: Yes   Current Outpatient Prescriptions on File Prior to Visit  Medication Sig Dispense Refill  . ACZONE 7.5 % GEL Apply to face once daily  3  . Albuterol Sulfate 108 (90 BASE) MCG/ACT AEPB Inhale 2 puffs into the lungs every 6 (six) hours as needed (shortness of breath).     . cetirizine (ZYRTEC) 10 MG tablet Take 10 mg by mouth daily as needed for allergies.    . fluticasone (FLONASE) 50 MCG/ACT nasal spray Place 1-2 sprays into both nostrils daily as needed for allergies or rhinitis.    . Fluticasone-Salmeterol (ADVAIR) 100-50 MCG/DOSE AEPB Inhale 1 puff into the lungs every morning.     Marland Kitchen SYNTHROID 150 MCG tablet Take 1 tablet (150 mcg total) by mouth daily before breakfast. 90 tablet 1  . tretinoin (RETIN-A) 0.025 % cream Apply to face once daily in the evening  3   No current facility-administered medications on file prior to visit.    Allergies  Allergen Reactions  . Mometasone Furoate Swelling  Only lips and nose.  . Montelukast Sodium Nausea And Vomiting   Family History  Problem Relation Age of Onset  . Hypertension Mother   . Bell's palsy Mother   . Thyroid disease Mother   . Hypertension Father   . Diabetes Father    PE: BP 112/68 (BP Location: Left Arm, Patient Position: Sitting)   Pulse 67   Wt 158 lb (71.7 kg)   LMP 11/29/2016   SpO2 98%   BMI 27.99 kg/m  Body mass index is 27.99 kg/m. Wt Readings from Last 3 Encounters:  12/26/16 158 lb (71.7 kg)  06/16/16 165 lb (74.8 kg)  05/22/16 172 lb (78 kg)   Constitutional: overweight, in NAD Eyes: PERRLA, EOMI, no exophthalmos ENT: moist mucous membranes, Thyroidectomy scar healed, no cervical lymphadenopathy Cardiovascular: RRR, No MRG Respiratory: CTA B Gastrointestinal: abdomen soft, NT, ND, BS+ Musculoskeletal: no  deformities, strength intact in all 4 Skin: moist, warm, no rashes Neurological: + slight tremor with outstretched hands, DTR normal in all 4  ASSESSMENT: 1. Follicular PTC  2. Postsurgical hypothyroidism  PLAN: 1. Follicular variant of PTC -  she is status post total thyroidectomy and had RAI treatment in 08/2015. Posttreatment whole-body scan was negative for metastasis - She has low risk thyroid cancer: Follicular variant of PTC, which is the most benign of the PTC types. She is stage I because of age. Her tumor was not encapsulated and he was large (3 cm) and that is why suggested RAI treatment, which we did with Thyrogen.  - She is contemplating another pregnancy this year - At today's visit, will check a thyroglobulin and ATA. Reviewed the last levels >> Tg 0.1 (great) - As she had a WBS that was negative 1 year after her surgery, I will not repeat imaging of her neck unless her Tg increases or she develops neck masses - I will see her back in 6 mo  2. Postop hypothyroidism - latest thyroid labs reviewed with pt >> normal  - she continues on LT4 150 mcg daily (decreased from 175 mcg at last visit) - pt feels good on this dose. - we discussed about taking the thyroid hormone every day, with water, >30 minutes before breakfast, separated by >4 hours from acid reflux medications, calcium, iron, multivitamins. Pt. is taking it correctly - will check thyroid tests today: TSH and fT4 - If labs are abnormal, she will need to return for repeat TFTs in 1.5 months - OTW, RTC in 6 mo - She plans to get pregnant later this year. We discussed about the need of increase levothyroxine dose during pregnancy. She agrees to let me know if she gets pregnant so we can plan medication changes and follow-up during pregnancy  Needs refills.  Component     Latest Ref Rng & Units 12/26/2016  TSH     0.35 - 4.50 uIU/mL 0.32 (L)  T4,Free(Direct)     0.60 - 1.60 ng/dL 1.07  TSH is slightly suppressed.  We can decrease her levothyroxine dose further, to 137 micrograms daily. We'll recheck her TFTs in 5-6 weeks.   Component     Latest Ref Rng & Units 12/26/2016  Thyroglobulin     ng/mL 0.1 (L)  Thyroglobulin Ab     <2 IU/mL <1   TG very low and ATA undetectable.  CC: Dr Harlow Asa  Philemon Kingdom, MD PhD [facility] Endocrinology

## 2016-12-27 LAB — THYROGLOBULIN ANTIBODY

## 2016-12-27 LAB — THYROGLOBULIN LEVEL: THYROGLOBULIN: 0.1 ng/mL — AB

## 2016-12-27 MED ORDER — SYNTHROID 137 MCG PO TABS: 137.0000 ug | ORAL_TABLET | Freq: Every day | ORAL | 1 refills | Status: DC

## 2016-12-28 ENCOUNTER — Encounter: Payer: Self-pay | Admitting: Internal Medicine

## 2017-01-02 ENCOUNTER — Encounter: Payer: Self-pay | Admitting: Internal Medicine

## 2017-02-23 ENCOUNTER — Other Ambulatory Visit (INDEPENDENT_AMBULATORY_CARE_PROVIDER_SITE_OTHER): Payer: BC Managed Care – PPO

## 2017-02-23 DIAGNOSIS — E890 Postprocedural hypothyroidism: Secondary | ICD-10-CM | POA: Diagnosis not present

## 2017-02-23 LAB — TSH: TSH: 0.56 u[IU]/mL (ref 0.35–4.50)

## 2017-02-23 LAB — T4, FREE: FREE T4: 1.15 ng/dL (ref 0.60–1.60)

## 2017-02-25 ENCOUNTER — Encounter: Payer: Self-pay | Admitting: Internal Medicine

## 2017-02-26 ENCOUNTER — Other Ambulatory Visit: Payer: Self-pay

## 2017-02-26 ENCOUNTER — Encounter: Payer: Self-pay | Admitting: Internal Medicine

## 2017-02-26 MED ORDER — SYNTHROID 137 MCG PO TABS: 137.0000 ug | ORAL_TABLET | Freq: Every day | ORAL | 1 refills | Status: DC

## 2017-03-19 LAB — OB RESULTS CONSOLE HIV ANTIBODY (ROUTINE TESTING): HIV: NONREACTIVE

## 2017-03-19 LAB — OB RESULTS CONSOLE ABO/RH: RH TYPE: POSITIVE

## 2017-03-19 LAB — OB RESULTS CONSOLE RPR: RPR: NONREACTIVE

## 2017-03-19 LAB — OB RESULTS CONSOLE ANTIBODY SCREEN: Antibody Screen: NEGATIVE

## 2017-03-19 LAB — OB RESULTS CONSOLE GC/CHLAMYDIA
Chlamydia: NEGATIVE
GC PROBE AMP, GENITAL: NEGATIVE

## 2017-03-19 LAB — OB RESULTS CONSOLE RUBELLA ANTIBODY, IGM: Rubella: IMMUNE

## 2017-03-19 LAB — OB RESULTS CONSOLE HEPATITIS B SURFACE ANTIGEN: Hepatitis B Surface Ag: NEGATIVE

## 2017-03-23 ENCOUNTER — Other Ambulatory Visit (INDEPENDENT_AMBULATORY_CARE_PROVIDER_SITE_OTHER): Payer: BC Managed Care – PPO

## 2017-03-23 DIAGNOSIS — E890 Postprocedural hypothyroidism: Secondary | ICD-10-CM | POA: Diagnosis not present

## 2017-03-23 LAB — T4, FREE: FREE T4: 0.92 ng/dL (ref 0.60–1.60)

## 2017-03-23 LAB — TSH: TSH: 0.87 u[IU]/mL (ref 0.35–4.50)

## 2017-03-26 ENCOUNTER — Encounter: Payer: Self-pay | Admitting: Internal Medicine

## 2017-05-21 ENCOUNTER — Other Ambulatory Visit: Payer: Self-pay | Admitting: Internal Medicine

## 2017-05-21 DIAGNOSIS — E890 Postprocedural hypothyroidism: Secondary | ICD-10-CM

## 2017-05-22 ENCOUNTER — Encounter: Payer: Self-pay | Admitting: Internal Medicine

## 2017-05-22 ENCOUNTER — Other Ambulatory Visit (INDEPENDENT_AMBULATORY_CARE_PROVIDER_SITE_OTHER): Payer: BC Managed Care – PPO

## 2017-05-22 ENCOUNTER — Other Ambulatory Visit: Payer: Self-pay | Admitting: Internal Medicine

## 2017-05-22 DIAGNOSIS — E890 Postprocedural hypothyroidism: Secondary | ICD-10-CM

## 2017-05-22 LAB — TSH: TSH: 2.67 u[IU]/mL (ref 0.35–4.50)

## 2017-05-22 LAB — T4, FREE: FREE T4: 0.83 ng/dL (ref 0.60–1.60)

## 2017-06-19 NOTE — L&D Delivery Note (Addendum)
Operative Delivery Note At 6:47 AM a viable female was delivered via VBAC, Vacuum Assisted.  Presentation: vertex; Position: Left,, Occiput,, Anterior; Station: +3.  Verbal consent: obtained from patient.  Risks and benefits discussed in detail.  Risks include, but are not limited to the risks of anesthesia, bleeding, infection, damage to maternal tissues, fetal cephalhematoma.  There is also the risk of inability to effect vaginal delivery of the head, or shoulder dystocia that cannot be resolved by established maneuvers, leading to the need for emergency cesarean section.  Vacuum applied for maternal exhaustion Vacuum applied in 2 cycles of pushes,delivery effected within 2 minutes from start of Vacuum.   APGAR: 8, 9; weight pending. .   Placenta status: , .   Cord:  Normal appearing with 3 vessel cord Anesthesia: Epidural.   Instruments: [place] Episiotomy: None Lacerations: 1st degree Suture Repair: 3.0 vicryl Est. Blood Loss (mL): 200  Mom to postpartum.  Baby to Couplet care / Skin to Skin.  Alinda Dooms, ,MD,   11/02/2017, 7:56 AM

## 2017-06-28 ENCOUNTER — Ambulatory Visit: Payer: BC Managed Care – PPO | Admitting: Internal Medicine

## 2017-06-28 ENCOUNTER — Other Ambulatory Visit: Payer: Self-pay | Admitting: Internal Medicine

## 2017-06-28 ENCOUNTER — Encounter: Payer: Self-pay | Admitting: Internal Medicine

## 2017-06-28 VITALS — BP 102/60 | HR 90 | Ht 63.0 in | Wt 168.4 lb

## 2017-06-28 DIAGNOSIS — C73 Malignant neoplasm of thyroid gland: Secondary | ICD-10-CM | POA: Diagnosis not present

## 2017-06-28 DIAGNOSIS — E890 Postprocedural hypothyroidism: Secondary | ICD-10-CM | POA: Diagnosis not present

## 2017-06-28 LAB — TSH: TSH: 6.53 u[IU]/mL — ABNORMAL HIGH (ref 0.35–4.50)

## 2017-06-28 LAB — T4, FREE: Free T4: 0.82 ng/dL (ref 0.60–1.60)

## 2017-06-28 MED ORDER — SYNTHROID 175 MCG PO TABS: 175.0000 ug | ORAL_TABLET | Freq: Every day | ORAL | 5 refills | Status: DC

## 2017-06-28 NOTE — Telephone Encounter (Signed)
Pt needs synthyroid called [facility] not [facility]

## 2017-06-28 NOTE — Patient Instructions (Addendum)
Please stop at the lab.  Continue Synthroid 156 mcg daily.  Take the thyroid hormone every day, with water, at least 30 minutes before breakfast, separated by at least 4 hours from: - acid reflux medications - calcium - iron - multivitamins  Please come back for a follow-up appointment in 5-6 months.

## 2017-06-28 NOTE — Progress Notes (Signed)
Patient ID: Morgan Fleming, female   DOB: [birth year], 37 y.o.   MRN: [id number]   HPI  Morgan Fleming is a 37 y.o.-year-old female, initially referred by her ObGyn, Dr. Ena Dawley, returning for f/u for h/o FV of PTC and postsurgical hypothyroidism. Last visit 6 mo ago.    She is currently 5 months pregnant (will have another boy). Nausea resolved. Due date 10/22/2017.  Reviewed and addended history Thyroid cancer history: At the beginning of 06/2014 she noticed a thyroid nodule in the mirror and saw her OB/GYN doctor who ordered a thyroid ultrasound.  - 07/14/2014: Thyroid U/S:  Right thyroid lobe : 56 x 26 x 35 mm. Dominant 32 x 22 x 35 mm nodule, inferior pole. 4 x 3 x 5 mm hypoechoic nodule, superior pole.  Left thyroid lobe: 45 x 14 x 13 mm. Three small cysts, all less than 5 mm maximum diameter.  Isthmus Thickness: 5 mm. No nodules visualized.  Lymphadenopathy: None visualized.  - 08/12/2014: FNA of the right thyroid nodule Adequacy Reason Satisfactory For Evaluation. Diagnosis THYROID, RIGHT, FINE NEEDLE ASPIRATION (SPECIMEN 1 OF 1, COLLECTED ON 08/12/2014): ATYPIA OF UNDETERMINED SIGNIFICANCE / FOLLICULAR LESION OF UNDETERMINED SIGNIFICANCE (B[place] CATEGORY III). SEE COMMENT. 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 - RESECTION MARGIN, NEGATIVE FOR ATYPIA OR MALIGNANCY. PLEASE SEE ONCOLOGY TEMPLATE FOR DETAILS. - TWO PARATHYROID GLANDS, NO EVIDENCE OF NEOPLASM. Specimen: Right thyroid gland. Procedure: Right hemi-thyroidectomy. Specimen  Integrity (intact/fragmented): Intact. Tumor focality: Unifocal. Dominant tumor: Maximum tumor size (cm): 3.0 cm. Tumor laterality: Right thyroid. Histologic type (including subtype and/or unique features as applicable): Follicular variant of papillary thyroid carcinoma. Tumor capsule: Not identified. Extrathyroidal extension: No. Margins: Negative. Lymph - Vascular invasion: Not identified. Capsular invasion with degree of invasion if present: N/A. Lymph nodes: # examined N/A; # positive; N/A. TNM code: pT2, pNX  - 09/25/2014: Completion thyroidectomy - As she was breast-feeding, we delayed RAI tx (She has 2 small boys), but had this with Tg 09/16/2015 >> WBS 09/24/2015 >> no mets.  Thyroglobulin levels have been low: Lab Results  Component Value Date   THYROGLB 0.1 (L) 12/26/2016   THYROGLB 0.1 (L) 12/16/2015   THGAB <1 12/26/2016   THGAB <1 12/16/2015   The I reviewed patient's thyroid tests >> at goal during the pregnancy: Lab Results  Component Value Date   TSH 2.67 05/22/2017   TSH 0.87 03/23/2017   TSH 0.56 02/23/2017   TSH 0.32 (L) 12/26/2016   TSH 0.42 08/29/2016   FREET4 0.83 05/22/2017   FREET4 0.92 03/23/2017   FREET4 1.15 02/23/2017   FREET4 1.07 12/26/2016   FREET4 0.96 08/29/2016  07/28/2014: TSH 0.916   Pt is on levothyroxine DAw 137 mcg 6/7, 2x137 mcg 1/7 (~156 mcg daily), taken: - in am - fasting - at least 30 min from b'fast - no Ca, Fe, PPIs - + MVI at night - not on Biotin  She has SOB and wheezing (asthma).    ROS: Constitutional: +-` weight gain/no weight loss, no fatigue, no subjective hyperthermia, no subjective hypothermia Eyes: no blurry vision, no xerophthalmia ENT: no sore throat, no nodules palpated in throat, no  dysphagia, no odynophagia, no hoarseness Cardiovascular: no CP/no SOB/no palpitations/no leg swelling Respiratory: no cough/no SOB/no wheezing Gastrointestinal: no N/no V/no D/no C/no acid reflux Musculoskeletal: no  muscle aches/no joint aches Skin: no rashes, no hair loss Neurological: no tremors/no numbness/no tingling/no dizziness  I reviewed pt's medications, allergies, PMH, social hx, family hx, and changes were documented in the history of present illness. Otherwise, unchanged from my initial visit note.  Past Medical History:  Diagnosis Date  . Anemia   . 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 Asthma    daily and prn inhaler  . Breast feeding status of mother    instructed to pump and discard x 24 hrs. post-op  . Cancer ([place])    thyroid  . Headache 04/01/2015   Traction headache  . Hypothyroidism   . PONV (postoperative nausea and vomiting)    vomiting  . Thyroid neoplasm   . Umbilical hernia    Past Surgical History:  Procedure Laterality Date  . CESAREAN SECTION N/A 06/29/2014   Procedure: CESAREAN SECTION;  Surgeon: Crawford Givens, MD;  Location: [facility];  Service: Obstetrics;  Laterality: N/A;  . DILATION AND CURETTAGE OF UTERUS  03/19/2009   suction D & C, polypectomy  . SKIN TAG REMOVAL  1996   right ear  . SUBOCCIPITAL CRANIECTOMY CERVICAL LAMINECTOMY N/A 05/22/2016   Procedure: CHIARI DECOMPRESSION CERVICAL ONE-TWO DECOMPRESSION LAMINECTOMY;  Surgeon: Kary Kos, MD;  Location: [facility];  Service: Neurosurgery;  Laterality: N/A;  . THYROID LOBECTOMY Right 09/10/2014   Procedure: RIGHT THYROID LOBECTOMY;  Surgeon: Armandina Gemma, MD;  Location: [facility];  Service: General;  Laterality: Right;  . THYROIDECTOMY N/A 09/25/2014   Procedure: COMPLETION THYROIDECTOMY ;  Surgeon: Armandina Gemma, MD;  Location: [facility];  Service: General;  Laterality: N/A;  . UMBILICAL HERNIA REPAIR  05/29/2011   Procedure: HERNIA REPAIR UMBILICAL ADULT;  Surgeon: Belva Crome, MD;  Location: [facility];  Service: General;  Laterality: N/A;  Umbilical Hernia repair with mesh patch  . VACUUM ASSISTED VAGINAL DELIVERY  10/06/2010   repair of second degree midline lac.   History   Social  History  . Marital Status: Married    Spouse Name: N/A  . Number of Children: 2   Occupational History  .  school counselor    Social History Main Topics  . Smoking status: Never Smoker   . Smokeless tobacco: Never Used  . Alcohol Use: No  . Drug Use: No  . Sexual Activity: Yes   Current Outpatient Medications on File Prior to Visit  Medication Sig Dispense Refill  . Albuterol Sulfate 108 (90 BASE) MCG/ACT AEPB Inhale 2 puffs into the lungs every 6 (six) hours as needed (shortness of breath).     . cetirizine (ZYRTEC) 10 MG tablet Take 10 mg by mouth daily as needed for allergies.    . fluticasone (FLONASE) 50 MCG/ACT nasal spray Place 1-2 sprays into both nostrils daily as needed for allergies or rhinitis.    . Fluticasone-Salmeterol (ADVAIR) 100-50 MCG/DOSE AEPB Inhale 1 puff into the lungs every morning.     Marland Kitchen SYNTHROID 137 MCG tablet Take 1 tablet (137 mcg total) by mouth daily before breakfast. 45 tablet 1   No current facility-administered medications on file prior to visit.    Allergies  Allergen Reactions  . Mometasone Furoate Swelling    Only lips and nose.  . Montelukast Sodium Nausea And Vomiting   Family History  Problem Relation Age of Onset  .  Hypertension Mother   . Bell's palsy Mother   . Thyroid disease Mother   . Hypertension Father   . Diabetes Father    PE: BP 102/60   Pulse 90   Ht '5\' 3"'  (1.6 m)   Wt 168 lb 6.4 oz (76.4 kg)   LMP 11/29/2016   SpO2 99%   BMI 29.83 kg/m  Body mass index is 29.83 kg/m. Wt Readings from Last 3 Encounters:  06/28/17 168 lb 6.4 oz (76.4 kg)  12/26/16 158 lb (71.7 kg)  06/16/16 165 lb (74.8 kg)   Constitutional: Pregnant appearing, in NAD Eyes: PERRLA, EOMI, no exophthalmos ENT: moist mucous membranes, no neck masses palpated, thyroidectomy scar healed, no cervical lymphadenopathy Cardiovascular: Tachycardia, RR, No MRG Respiratory: CTA B Gastrointestinal: abdomen soft, NT, ND, BS+ Musculoskeletal: no  deformities, strength intact in all 4 Skin: moist, warm, no rashes Neurological: no tremor with outstretched hands, DTR normal in all 4  ASSESSMENT: 1. Follicular PTC  2. Postsurgical hypothyroidism  PLAN: 1. Follicular variant of PTC - Patient has a history of total thyroidectomy for follicular variant of papillary thyroid cancer and had RAI treatment in 08/2015.  Posttreatment WBS was negative for metastases or recurrences.   - She is low risk for complications from her thyroid cancer as the follicular variant of PTC is the most benign of the PTC types and she is also stage I because of age.  Her tumor was not encapsulated and was large (3 cm) and that is why I suggested RAI treatment, which we did with Thyrogen - No neck masses palpated or felt by patient - Latest thyroglobulin was 0.1, low - We will repeat the thyroglobulin and ATA after her pregnancy. - I will see her back in 6 mo  2. Postop hypothyroidism - latest thyroid labs reviewed with pt >> normal, at target for 2nd trimester of pregnancy - she continues on an equivalent of LT4 156 mcg daily  - pt feels good on this dose. - we discussed about taking the thyroid hormone every day, with water, >30 minutes before breakfast, separated by >4 hours from acid reflux medications, calcium, iron, multivitamins. Pt. is taking it correctly - will check thyroid tests today: TSH and fT4 - If labs are abnormal, she will need to return for repeat TFTs in 1.5 months - OTW, RTC in 6 mo  Needs refills.  Office Visit on 06/28/2017  Component Date Value Ref Range Status  . TSH 06/28/2017 6.53* 0.35 - 4.50 uIU/mL Final  . Free T4 06/28/2017 0.82  0.60 - 1.60 ng/dL Final   Comment: Specimens from patients who are undergoing biotin therapy and /or ingesting biotin supplements may contain high levels of biotin.  The higher biotin concentration in these specimens interferes with this Free T4 assay.  Specimens that contain high levels  of biotin  may cause false high results for this Free T4 assay.  Please interpret results in light of the total clinical presentation of the patient.     TSH is too high >> increase LT4 to 175 mcg daily and have her back for labs in 1 mo.  CC:  Dr. Crawford Givens  Philemon Kingdom, MD PhD [facility] Endocrinology

## 2017-07-26 ENCOUNTER — Other Ambulatory Visit (INDEPENDENT_AMBULATORY_CARE_PROVIDER_SITE_OTHER): Payer: BC Managed Care – PPO

## 2017-07-26 ENCOUNTER — Other Ambulatory Visit: Payer: Self-pay | Admitting: Internal Medicine

## 2017-07-26 DIAGNOSIS — E890 Postprocedural hypothyroidism: Secondary | ICD-10-CM

## 2017-07-26 DIAGNOSIS — E0500 Thyrotoxicosis with diffuse goiter without thyrotoxic crisis or storm: Secondary | ICD-10-CM

## 2017-07-26 LAB — TSH: TSH: 3.92 u[IU]/mL (ref 0.35–4.50)

## 2017-07-26 LAB — T4, FREE: Free T4: 0.92 ng/dL (ref 0.60–1.60)

## 2017-08-24 ENCOUNTER — Encounter: Payer: Self-pay | Admitting: Internal Medicine

## 2017-08-24 ENCOUNTER — Other Ambulatory Visit (INDEPENDENT_AMBULATORY_CARE_PROVIDER_SITE_OTHER): Payer: BC Managed Care – PPO

## 2017-08-24 DIAGNOSIS — E0500 Thyrotoxicosis with diffuse goiter without thyrotoxic crisis or storm: Secondary | ICD-10-CM | POA: Diagnosis not present

## 2017-08-24 LAB — TSH: TSH: 2.3 u[IU]/mL (ref 0.35–4.50)

## 2017-08-24 LAB — T4, FREE: FREE T4: 0.75 ng/dL (ref 0.60–1.60)

## 2017-09-06 ENCOUNTER — Other Ambulatory Visit (HOSPITAL_COMMUNITY): Payer: Self-pay | Admitting: Obstetrics & Gynecology

## 2017-09-06 DIAGNOSIS — O403XX0 Polyhydramnios, third trimester, not applicable or unspecified: Secondary | ICD-10-CM

## 2017-09-10 ENCOUNTER — Ambulatory Visit (HOSPITAL_COMMUNITY)
Admission: RE | Admit: 2017-09-10 | Discharge: 2017-09-10 | Disposition: A | Discharge status: Home / Self Care | Payer: BC Managed Care – PPO | Source: Ambulatory Visit | Attending: Obstetrics & Gynecology | Admitting: Obstetrics & Gynecology

## 2017-09-10 ENCOUNTER — Other Ambulatory Visit (HOSPITAL_COMMUNITY): Payer: Self-pay | Admitting: Obstetrics & Gynecology

## 2017-09-10 DIAGNOSIS — O09523 Supervision of elderly multigravida, third trimester: Secondary | ICD-10-CM

## 2017-09-10 DIAGNOSIS — O403XX0 Polyhydramnios, third trimester, not applicable or unspecified: Secondary | ICD-10-CM

## 2017-09-10 DIAGNOSIS — Z3A32 32 weeks gestation of pregnancy: Secondary | ICD-10-CM

## 2017-09-27 LAB — OB RESULTS CONSOLE GBS: STREP GROUP B AG: POSITIVE

## 2017-10-03 ENCOUNTER — Other Ambulatory Visit: Payer: BC Managed Care – PPO

## 2017-10-03 ENCOUNTER — Other Ambulatory Visit (INDEPENDENT_AMBULATORY_CARE_PROVIDER_SITE_OTHER): Payer: BC Managed Care – PPO

## 2017-10-03 DIAGNOSIS — E890 Postprocedural hypothyroidism: Secondary | ICD-10-CM

## 2017-10-03 LAB — TSH: TSH: 2.7 u[IU]/mL (ref 0.35–4.50)

## 2017-10-03 LAB — T4, FREE: Free T4: 0.82 ng/dL (ref 0.60–1.60)

## 2017-10-19 ENCOUNTER — Encounter (HOSPITAL_COMMUNITY): Payer: Self-pay | Admitting: *Deleted

## 2017-10-19 ENCOUNTER — Telehealth (HOSPITAL_COMMUNITY): Payer: Self-pay | Admitting: *Deleted

## 2017-10-19 NOTE — Telephone Encounter (Signed)
Preadmission screen  

## 2017-10-29 ENCOUNTER — Other Ambulatory Visit: Payer: Self-pay | Admitting: Obstetrics & Gynecology

## 2017-11-01 ENCOUNTER — Encounter (HOSPITAL_COMMUNITY): Payer: Self-pay

## 2017-11-01 ENCOUNTER — Inpatient Hospital Stay (HOSPITAL_COMMUNITY)
Admission: RE | Admit: 2017-11-01 | Discharge: 2017-11-04 | DRG: 807 | Disposition: A | Discharge status: Home / Self Care | Payer: BC Managed Care – PPO | Source: Ambulatory Visit | Attending: Obstetrics & Gynecology | Admitting: Obstetrics & Gynecology

## 2017-11-01 ENCOUNTER — Inpatient Hospital Stay (HOSPITAL_COMMUNITY)
Admission: AD | Admit: 2017-11-01 | Payer: BC Managed Care – PPO | Source: Ambulatory Visit | Admitting: Obstetrics and Gynecology

## 2017-11-01 DIAGNOSIS — O9902 Anemia complicating childbirth: Secondary | ICD-10-CM | POA: Diagnosis present

## 2017-11-01 DIAGNOSIS — O403XX0 Polyhydramnios, third trimester, not applicable or unspecified: Secondary | ICD-10-CM | POA: Diagnosis present

## 2017-11-01 DIAGNOSIS — D649 Anemia, unspecified: Secondary | ICD-10-CM | POA: Diagnosis present

## 2017-11-01 DIAGNOSIS — Z3A40 40 weeks gestation of pregnancy: Secondary | ICD-10-CM | POA: Diagnosis not present

## 2017-11-01 DIAGNOSIS — O99214 Obesity complicating childbirth: Secondary | ICD-10-CM | POA: Diagnosis present

## 2017-11-01 DIAGNOSIS — O34219 Maternal care for unspecified type scar from previous cesarean delivery: Secondary | ICD-10-CM | POA: Diagnosis present

## 2017-11-01 LAB — CBC
HEMATOCRIT: 30.6 % — AB (ref 36.0–46.0)
HEMOGLOBIN: 10.3 g/dL — AB (ref 12.0–15.0)
MCH: 33.4 pg (ref 26.0–34.0)
MCHC: 33.7 g/dL (ref 30.0–36.0)
MCV: 99.4 fL (ref 78.0–100.0)
Platelets: 278 10*3/uL (ref 150–400)
RBC: 3.08 MIL/uL — ABNORMAL LOW (ref 3.87–5.11)
RDW: 13.6 % (ref 11.5–15.5)
WBC: 8.2 10*3/uL (ref 4.0–10.5)

## 2017-11-01 LAB — TYPE AND SCREEN
ABO/RH(D): O POS
ANTIBODY SCREEN: NEGATIVE

## 2017-11-01 LAB — RPR: RPR: NONREACTIVE

## 2017-11-01 MED ORDER — PHENYLEPHRINE 40 MCG/ML (10ML) SYRINGE FOR IV PUSH (FOR BLOOD PRESSURE SUPPORT)
80.0000 ug | PREFILLED_SYRINGE | INTRAVENOUS | Status: DC | PRN
  Filled 2017-11-01: qty 10
  Filled 2017-11-01: qty 5

## 2017-11-01 MED ORDER — DIPHENHYDRAMINE HCL 50 MG/ML IJ SOLN: 12.5000 mg | INTRAMUSCULAR | Status: DC | PRN

## 2017-11-01 MED ORDER — SODIUM CHLORIDE 0.9 % IV SOLN
5.0000 10*6.[IU] | Freq: Once | INTRAVENOUS | Status: AC
  Administered 2017-11-01: 5 10*6.[IU] via INTRAVENOUS
  Filled 2017-11-01: qty 5

## 2017-11-01 MED ORDER — EPHEDRINE 5 MG/ML INJ
10.0000 mg | INTRAVENOUS | Status: DC | PRN
  Filled 2017-11-01: qty 2

## 2017-11-01 MED ORDER — OXYTOCIN 40 UNITS IN LACTATED RINGERS INFUSION - SIMPLE MED
1.0000 m[IU]/min | INTRAVENOUS | Status: DC
  Administered 2017-11-01: 2 m[IU]/min via INTRAVENOUS
  Filled 2017-11-01: qty 1000

## 2017-11-01 MED ORDER — FENTANYL CITRATE (PF) 100 MCG/2ML IJ SOLN: 50.0000 ug | INTRAMUSCULAR | Status: DC | PRN

## 2017-11-01 MED ORDER — ACETAMINOPHEN 325 MG PO TABS: 650.0000 mg | ORAL_TABLET | ORAL | Status: DC | PRN

## 2017-11-01 MED ORDER — TERBUTALINE SULFATE 1 MG/ML IJ SOLN
0.2500 mg | Freq: Once | INTRAMUSCULAR | Status: DC | PRN
  Filled 2017-11-01: qty 1

## 2017-11-01 MED ORDER — OXYTOCIN BOLUS FROM INFUSION
500.0000 mL | Freq: Once | INTRAVENOUS | Status: AC
  Administered 2017-11-02: 500 mL via INTRAVENOUS

## 2017-11-01 MED ORDER — LIDOCAINE HCL (PF) 1 % IJ SOLN
30.0000 mL | INTRAMUSCULAR | Status: AC | PRN
  Administered 2017-11-02: 30 mL via SUBCUTANEOUS
  Filled 2017-11-01: qty 30

## 2017-11-01 MED ORDER — OXYTOCIN 40 UNITS IN LACTATED RINGERS INFUSION - SIMPLE MED: 2.5000 [IU]/h | INTRAVENOUS | Status: DC

## 2017-11-01 MED ORDER — FLEET ENEMA 7-19 GM/118ML RE ENEM: 1.0000 | ENEMA | RECTAL | Status: DC | PRN

## 2017-11-01 MED ORDER — LACTATED RINGERS IV SOLN
INTRAVENOUS | Status: DC
  Administered 2017-11-01 – 2017-11-02 (×2): via INTRAVENOUS

## 2017-11-01 MED ORDER — LACTATED RINGERS IV SOLN
500.0000 mL | INTRAVENOUS | Status: DC | PRN
  Administered 2017-11-01 – 2017-11-02 (×2): 500 mL via INTRAVENOUS

## 2017-11-01 MED ORDER — LACTATED RINGERS IV SOLN: 500.0000 mL | Freq: Once | INTRAVENOUS | Status: DC

## 2017-11-01 MED ORDER — PENICILLIN G POT IN DEXTROSE 60000 UNIT/ML IV SOLN
3.0000 10*6.[IU] | INTRAVENOUS | Status: DC
  Administered 2017-11-01 – 2017-11-02 (×5): 3 10*6.[IU] via INTRAVENOUS
  Filled 2017-11-01 (×13): qty 50

## 2017-11-01 MED ORDER — SOD CITRATE-CITRIC ACID 500-334 MG/5ML PO SOLN: 30.0000 mL | ORAL | Status: DC | PRN

## 2017-11-01 MED ORDER — FENTANYL 2.5 MCG/ML BUPIVACAINE 1/10 % EPIDURAL INFUSION (WH - ANES)
14.0000 mL/h | INTRAMUSCULAR | Status: DC | PRN
  Filled 2017-11-01: qty 100

## 2017-11-01 MED ORDER — ONDANSETRON HCL 4 MG/2ML IJ SOLN: 4.0000 mg | Freq: Four times a day (QID) | INTRAMUSCULAR | Status: DC | PRN

## 2017-11-01 NOTE — Progress Notes (Signed)
LABOR PROGRESS NOTE  Morgan Fleming is a 37 y.o. [phone] at [redacted]w[redacted]d  admitted for polyhydramnios, VBAC  Subjective: Comfortable sitting on yoga ball and breathing through contractions. Discussed risks and benefits of AROM with patient, she desires to wait until she is further dilated. Pitocin infusing at 14u/min  Objective:  Vitals:   11/01/17 1038 11/01/17 1138 11/01/17 1348 11/01/17 1431  BP: 124/80 (!) 107/57 101/62 (!) 103/50  Pulse: 72 78 77 68  Resp:  18 18   Temp:   98.1 F (36.7 C)   TempSrc:   Oral   Weight:      Height:        Dilation: 3 Effacement (%): 50 Cervical Position: Posterior Station: -2 Presentation: Vertex Exam by:: greer  Labs: Lab Results  Component Value Date   WBC 8.2 11/01/2017   HGB 10.3 (L) 11/01/2017   HCT 30.6 (L) 11/01/2017   MCV 99.4 11/01/2017   PLT 278 11/01/2017    Patient Active Problem List   Diagnosis Date Noted  . Polyhydramnios affecting pregnancy in third trimester 11/01/2017  . 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 Headache 04/01/2015  . Postsurgical hypothyroidism 11/23/2014  . Thyroid cancer, follicular variant of papillary 09/24/2014  . S/P emergency cesarean section 06/30/2014  . History of vacuum extraction assisted delivery 06/30/2014  . Active labor at term 06/29/2014  . Postop check 06/22/2011  . Umbilical hernia 16/60/6301    Assessment / Plan: 37 y.o. G5P2 at 40 weeks Pitocin IOL for polyhydramnios  Early Labor Category 1 FHTs  Continue pitocin per protocol AROM if indicated and patient consents  Anticipate NSVD   Marikay Alar CNM 11/01/2017, 4:05 PM

## 2017-11-01 NOTE — Progress Notes (Signed)
LABOR PROGRESS NOTE  Morgan Fleming is a 37 y.o. [id number] at [redacted]w[redacted]d  admitted for polyhydramnios, VBAC  Subjective: Comfortable with contractions; Foley bulb attempted but unable to place due to difficulty of exam. Pitocin infusing at 18mu/min  Objective: BP (!) 107/57   Pulse 78   Temp 98.3 F (36.8 C) (Oral)   Resp 18   Ht 5\' 3"  (1.6 m)   Wt 192 lb 6.4 oz (87.3 kg)   LMP 11/29/2016   BMI 34.08 kg/m  or  Vitals:   11/01/17 0918 11/01/17 0958 11/01/17 1038 11/01/17 1138  BP: 128/76 131/79 124/80 (!) 107/57  Pulse: 72 73 72 78  Resp:    18  Temp:      TempSrc:      Weight:      Height:         Dilation: 1.5 Effacement (%): 50 Cervical Position: Posterior Station: -3 Presentation: Vertex Exam by:: Emie Sommerfeld,CNM  Labs: Lab Results  Component Value Date   WBC 8.2 11/01/2017   HGB 10.3 (L) 11/01/2017   HCT 30.6 (L) 11/01/2017   MCV 99.4 11/01/2017   PLT 278 11/01/2017    Patient Active Problem List   Diagnosis Date Noted  . Polyhydramnios affecting pregnancy in third trimester 11/01/2017  . 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 Headache 04/01/2015  . Postsurgical hypothyroidism 11/23/2014  . Thyroid cancer, follicular variant of papillary 09/24/2014  . S/P emergency cesarean section 06/30/2014  . History of vacuum extraction assisted delivery 06/30/2014  . Active labor at term 06/29/2014  . Postop check 06/22/2011  . Umbilical hernia 31/51/7616    Assessment / Plan:  Labor: Stage 1  Fetal Wellbeing:  Cat1 Pain Control:  Desires unmedicated delivery and no AROM Anticipated MOD:  SVD  Wetona Viramontes A Merdis Snodgrass CNM 11/01/2017, 1:15 PM

## 2017-11-01 NOTE — Anesthesia Pain Management Evaluation Note (Signed)
  CRNA Pain Management Visit Note  Patient: Morgan Fleming, 37 y.o., female  "Hello I am a member of the anesthesia team at [facility]. We have an anesthesia team available at all times to provide care throughout the hospital, including epidural management and anesthesia for C-section. I don't know your plan for the delivery whether it a natural birth, water birth, IV sedation, nitrous supplementation, doula or epidural, but we want to meet your pain goals."   1.Was your pain managed to your expectations on prior hospitalizations?   Yes   2.What is your expectation for pain management during this hospitalization?     IV pain meds  3.How can we help you reach that goal? Hopes to not have epidural, may do IV meds  Record the patient's initial score and the patient's pain goal.   Pain: 0  Pain Goal: 5 [facility] wants you to be able to say your pain was always managed very well.  Bufford Spikes 11/01/2017

## 2017-11-01 NOTE — Progress Notes (Signed)
Morgan Fleming is a 37 y.o. [id number] at [redacted]w[redacted]d admitted for IOL for polyhydramnios  Subjective: Patient feels contractions, declines pain medication.   Objective: BP 126/82   Pulse 71   Temp 99.1 F (37.3 C) (Oral)   Resp 18   Ht 5\' 3"  (1.6 m)   Wt 87.3 kg (192 lb 6.4 oz)   LMP 11/29/2016   SpO2 97%   BMI 34.08 kg/m  No intake/output data recorded. No intake/output data recorded.  FHT:  Category 1 UC:   Q 1- 3 mins SVE:   Dilation: 3 Effacement (%): 50 Station: -3 Exam by:: Alesia Richards, MD  Labs: Lab Results  Component Value Date   WBC 8.2 11/01/2017   HGB 10.3 (L) 11/01/2017   HCT 30.6 (L) 11/01/2017   MCV 99.4 11/01/2017   PLT 278 11/01/2017    Assessment / Plan: IOL on pitocin  Labor: Plan for AROM when head well applied and patient agreeable.   She prefers to defer AROM to later when with more advancec dilation.  Fetal Wellbeing:  Category I Pain Control:  Labor support without medications I/D:  GBS neg.  Anticipated MOD:  NSVD  Alinda Dooms, MD.  11/01/2017, 9:56 PM

## 2017-11-01 NOTE — H&P (Signed)
Subjective:  Morgan Fleming is a 37 y.o. G5 P2 female with EDC 11/01/17 at 45 and 0/[redacted] weeks gestation who is being admitted for induction of labor.  Her current obstetrical history is significant for polyhydramnios. Patient reports occasional contractions.   Fetal Movement: normal.  History of SVD after induction of labor with vacuum extraction and emergency cesarean section for nonreassuring FHTs. TOLAC today. Patient desires an unmedicated birth, 10 minutes of delayed cord clamping, and wishes to take placenta home.     Objective:   Vitals:   11/01/17 0805 11/01/17 0819  BP: 129/82   Pulse: 87   Resp: 16   Temp: 98.3 F (36.8 C)   TempSrc: Oral   Weight:  87.3 kg (192 lb 6.4 oz)  Height:  5\' 3"  (1.6 m)  Review of Systems  All other systems reviewed and are negative.   Physical Exam  Constitutional: She is oriented to person, place, and time and well-developed, well-nourished, and in no distress.  HENT:  Head: Normocephalic and atraumatic.  Eyes: Pupils are equal, round, and reactive to light.  Cardiovascular: Normal rate, regular rhythm and normal heart sounds.  Pulmonary/Chest: Effort normal and breath sounds normal.  Abdominal: Bowel sounds are normal.  Genitourinary: Vagina normal and cervix normal.  Musculoskeletal: Normal range of motion.  Neurological: She is alert and oriented to person, place, and time.  Skin: Skin is warm and dry.  Psychiatric: Mood, memory, affect and judgment normal.    Pelvis:  Vulva and vagina appear normal. Bimanual exam reveals normal uterus and adnexa.  FHT: 135 with moderate beat to beat variability, +accels, -decels  Uterine Size: size equals dates  Presentations: cephalic  Cervix:    Dilation: 1cm   Effacement: 50%   Station:  -2   Consistency: medium   Position: posterior   Lab Review  O, Rh+, Rubella-immune, Hepatitis B surface antigen non-reactive, GBS positive  AFP:NML  One hour GTT: Normal    Assessment/Plan:  37 y.o.  G5P2 at 40 and 0/[redacted] weeks gestation. Polyhydramnios  TOLAC Category 1 FHTs Obstetrical history significant for prior c-section and vacuum assisted SVD.    Admit to L&D Pitocin induction of labor Anticipate NSVD    Risks, benefits, alternatives and possible complications have been discussed in detail with the patient.  Pre-admission, admission, and post admission procedures and expectations were discussed in detail.  All questions answered, all appropriate consents will be signed at the Hospital. Admission is planned for today.   Marikay Alar 11/01/17 8:57 AM

## 2017-11-02 ENCOUNTER — Inpatient Hospital Stay (HOSPITAL_COMMUNITY): Payer: BC Managed Care – PPO | Admitting: Anesthesiology

## 2017-11-02 ENCOUNTER — Encounter (HOSPITAL_COMMUNITY): Payer: Self-pay

## 2017-11-02 MED ORDER — SIMETHICONE 80 MG PO CHEW: 80.0000 mg | CHEWABLE_TABLET | ORAL | Status: DC | PRN

## 2017-11-02 MED ORDER — MEDROXYPROGESTERONE ACETATE 150 MG/ML IM SUSP: 150.0000 mg | INTRAMUSCULAR | Status: DC | PRN

## 2017-11-02 MED ORDER — SENNOSIDES-DOCUSATE SODIUM 8.6-50 MG PO TABS
2.0000 | ORAL_TABLET | ORAL | Status: DC
  Administered 2017-11-03 (×2): 2 via ORAL
  Filled 2017-11-02 (×2): qty 2

## 2017-11-02 MED ORDER — TETANUS-DIPHTH-ACELL PERTUSSIS 5-2.5-18.5 LF-MCG/0.5 IM SUSP: 0.5000 mL | Freq: Once | INTRAMUSCULAR | Status: DC

## 2017-11-02 MED ORDER — EPHEDRINE 5 MG/ML INJ
10.0000 mg | INTRAVENOUS | Status: DC | PRN
  Filled 2017-11-02: qty 2

## 2017-11-02 MED ORDER — FENTANYL 2.5 MCG/ML BUPIVACAINE 1/10 % EPIDURAL INFUSION (WH - ANES): 14.0000 mL/h | INTRAMUSCULAR | Status: DC | PRN

## 2017-11-02 MED ORDER — PRENATAL MULTIVITAMIN CH
1.0000 | ORAL_TABLET | Freq: Every day | ORAL | Status: DC
  Administered 2017-11-02 – 2017-11-03 (×2): 1 via ORAL
  Filled 2017-11-02 (×2): qty 1

## 2017-11-02 MED ORDER — ERYTHROMYCIN 5 MG/GM OP OINT
TOPICAL_OINTMENT | OPHTHALMIC | Status: AC
Start: 2017-11-02 — End: 2017-11-02
  Filled 2017-11-02: qty 1

## 2017-11-02 MED ORDER — LIDOCAINE HCL (PF) 1 % IJ SOLN
INTRAMUSCULAR | Status: DC | PRN
  Administered 2017-11-02 (×2): 5 mL via EPIDURAL

## 2017-11-02 MED ORDER — LACTATED RINGERS IV SOLN
500.0000 mL | Freq: Once | INTRAVENOUS | Status: AC
  Administered 2017-11-02: 500 mL via INTRAVENOUS

## 2017-11-02 MED ORDER — ZOLPIDEM TARTRATE 5 MG PO TABS: 5.0000 mg | ORAL_TABLET | Freq: Every evening | ORAL | Status: DC | PRN

## 2017-11-02 MED ORDER — OXYCODONE HCL 5 MG PO TABS: 5.0000 mg | ORAL_TABLET | ORAL | Status: DC | PRN

## 2017-11-02 MED ORDER — PHENYLEPHRINE 40 MCG/ML (10ML) SYRINGE FOR IV PUSH (FOR BLOOD PRESSURE SUPPORT)
80.0000 ug | PREFILLED_SYRINGE | INTRAVENOUS | Status: DC | PRN
  Filled 2017-11-02: qty 5

## 2017-11-02 MED ORDER — WITCH HAZEL-GLYCERIN EX PADS: 1.0000 "application " | MEDICATED_PAD | CUTANEOUS | Status: DC | PRN

## 2017-11-02 MED ORDER — ONDANSETRON HCL 4 MG/2ML IJ SOLN: 4.0000 mg | INTRAMUSCULAR | Status: DC | PRN

## 2017-11-02 MED ORDER — BENZOCAINE-MENTHOL 20-0.5 % EX AERO
1.0000 "application " | INHALATION_SPRAY | CUTANEOUS | Status: DC | PRN
  Administered 2017-11-02: 1 via TOPICAL
  Filled 2017-11-02: qty 56

## 2017-11-02 MED ORDER — OXYCODONE HCL 5 MG PO TABS: 10.0000 mg | ORAL_TABLET | ORAL | Status: DC | PRN

## 2017-11-02 MED ORDER — ACETAMINOPHEN 325 MG PO TABS: 650.0000 mg | ORAL_TABLET | ORAL | Status: DC | PRN

## 2017-11-02 MED ORDER — DIPHENHYDRAMINE HCL 25 MG PO CAPS: 25.0000 mg | ORAL_CAPSULE | Freq: Four times a day (QID) | ORAL | Status: DC | PRN

## 2017-11-02 MED ORDER — BUTORPHANOL TARTRATE 1 MG/ML IJ SOLN
1.0000 mg | INTRAMUSCULAR | Status: DC | PRN
  Administered 2017-11-02 (×3): 1 mg via INTRAVENOUS
  Filled 2017-11-02 (×3): qty 1

## 2017-11-02 MED ORDER — COCONUT OIL OIL
1.0000 "application " | TOPICAL_OIL | Status: DC | PRN
  Administered 2017-11-03: 1 via TOPICAL
  Filled 2017-11-02: qty 120

## 2017-11-02 MED ORDER — IBUPROFEN 600 MG PO TABS
600.0000 mg | ORAL_TABLET | Freq: Four times a day (QID) | ORAL | Status: DC
  Administered 2017-11-02 – 2017-11-04 (×7): 600 mg via ORAL
  Filled 2017-11-02 (×8): qty 1

## 2017-11-02 MED ORDER — DIPHENHYDRAMINE HCL 50 MG/ML IJ SOLN: 12.5000 mg | INTRAMUSCULAR | Status: DC | PRN

## 2017-11-02 MED ORDER — DIBUCAINE 1 % RE OINT: 1.0000 "application " | TOPICAL_OINTMENT | RECTAL | Status: DC | PRN

## 2017-11-02 MED ORDER — ONDANSETRON HCL 4 MG PO TABS: 4.0000 mg | ORAL_TABLET | ORAL | Status: DC | PRN

## 2017-11-02 NOTE — Progress Notes (Signed)
Morgan Fleming is a 37 y.o. [id number] at [redacted]w[redacted]d here for IOL for polyhydramnios  Subjective: Patient feels strong contractions, IV pain meds help.   Objective: BP (!) 119/59   Pulse 74   Temp 99.1 F (37.3 C) (Oral)   Resp 18   Ht 5\' 3"  (1.6 m)   Wt 87.3 kg (192 lb 6.4 oz)   LMP 11/29/2016   SpO2 97%   BMI 34.08 kg/m  No intake/output data recorded. No intake/output data recorded.  FHT:  Category 1 UC:   regular, every 1-3 minutes SVE:   Dilation: 5 Effacement (%): 80 Station: -3 Exam by:: Naoma Boxell, MD   AROM done, light meconium stained fluid.   Labs: Lab Results  Component Value Date   WBC 8.2 11/01/2017   HGB 10.3 (L) 11/01/2017   HCT 30.6 (L) 11/01/2017   MCV 99.4 11/01/2017   PLT 278 11/01/2017    Assessment / Plan: Induction of labor for polyhydramnios  Labor: C/w pitocin.  s/p AROM.  Fetal Wellbeing:  Category I Pain Control:  IV pain meds I/D:  GBS negative.  Anticipated MOD:  VBAC  Alinda Dooms, MD.  11/02/2017, 4:07 AM

## 2017-11-02 NOTE — Anesthesia Postprocedure Evaluation (Signed)
Anesthesia Post Note  Patient: Morgan Fleming  Procedure(s) Performed: AN AD HOC LABOR EPIDURAL     Patient location during evaluation: Mother Baby Anesthesia Type: Epidural Level of consciousness: awake and alert and oriented Pain management: satisfactory to patient Vital Signs Assessment: post-procedure vital signs reviewed and stable Respiratory status: spontaneous breathing and nonlabored ventilation Cardiovascular status: stable Postop Assessment: no headache, no backache, no signs of nausea or vomiting, adequate PO intake and patient able to bend at knees (patient up walking) Anesthetic complications: no    Last Vitals:  Vitals:   11/02/17 0850 11/02/17 1009  BP: 119/67 110/65  Pulse: 64 (!) 53  Resp: 20 20  Temp: 37.1 C 36.7 C  SpO2:      Last Pain:  Vitals:   11/02/17 1009  TempSrc: Oral  PainSc:    Pain Goal:                 [name]

## 2017-11-02 NOTE — Lactation Note (Signed)
This note was copied from a baby's chart. Lactation Consultation Note  Patient Name: Morgan Fleming [id number] Date: 11/02/2017 Reason for consult: Initial assessment;Term Breastfeeding consultation services and support information given to patient.  This is her third baby and newborn is 8 hours old.  Baby has been to breast five times since birth and latching without difficulty.  Mom has a history of engorgement with last baby.  Engorgement prevention and treatment reviewed.  Instructed to watch for feeding cues and call for assist prn.  Maternal Data Has patient been taught Hand Expression?: Yes Does the patient have breastfeeding experience prior to this delivery?: Yes  Feeding Feeding Type: Breast Milk Length of feed: 28 min  LATCH Score Latch: Grasps breast easily, tongue down, lips flanged, rhythmical sucking.  Audible Swallowing: Spontaneous and intermittent  Type of Nipple: Everted at rest and after stimulation  Comfort (Breast/Nipple): Soft / non-tender  Hold (Positioning): No assistance needed to correctly position infant at breast.  LATCH Score: 10  Interventions    Lactation Tools Discussed/Used     Consult Status Consult Status: Follow-up Date: 11/03/17 Follow-up type: In-patient    Ave Filter 11/02/2017, 2:07 PM

## 2017-11-02 NOTE — Progress Notes (Signed)
Post Partum Day 1 Subjective: no complaints, up ad lib, voiding and tolerating PO, no symptomatic of anemia, will discharge with Iron supplement.   Objective: Blood pressure 119/75, pulse 61, temperature 98.2 F (36.8 C), temperature source Oral, resp. rate 18, height 5\' 3"  (1.6 m), weight 87.3 kg (192 lb 6.4 oz), last menstrual period 11/29/2016, SpO2 100 %, unknown if currently breastfeeding.  Physical Exam:  General: alert and cooperative Lochia: appropriate Uterine Fundus: firm Incision: n/a DVT Evaluation: No evidence of DVT seen on physical exam. Negative Homan's sign. No cords or calf tenderness. No significant calf/ankle edema.  Vitals:   11/02/17 1009 11/02/17 1641 11/02/17 1738 11/02/17 2100  BP: 110/65 123/71 114/69 119/75  Pulse: (!) 53 (!) 59 62 61  Resp: 20 20 18 18   Temp: 98 F (36.7 C)  98.3 F (36.8 C) 98.2 F (36.8 C)  TempSrc: Oral Oral Oral Oral  SpO2:    100%  Weight:      Height:       Results for orders placed or performed during the hospital encounter of 11/01/17 (from the past 24 hour(s))  CBC     Status: Abnormal   Collection Time: 11/03/17  5:27 AM  Result Value Ref Range   WBC 10.4 4.0 - 10.5 K/uL   RBC 2.56 (L) 3.87 - 5.11 MIL/uL   Hemoglobin 8.3 (L) 12.0 - 15.0 g/dL   HCT 25.5 (L) 36.0 - 46.0 %   MCV 99.6 78.0 - 100.0 fL   MCH 32.4 26.0 - 34.0 pg   MCHC 32.5 30.0 - 36.0 g/dL   RDW 13.9 11.5 - 15.5 %   Platelets 200 150 - 400 K/uL     Assessment/Plan: Plan for discharge tomorrow and Breastfeeding  Baby having circumcision today    LOS: 2 days   Morgan Fleming 11/03/2017, 6:22 AM

## 2017-11-02 NOTE — Anesthesia Procedure Notes (Signed)
Epidural Patient location during procedure: OB Start time: 11/02/2017 5:56 AM End time: 11/02/2017 6:32 AM  Staffing Anesthesiologist: Annye Asa, MD Performed: anesthesiologist   Preanesthetic Checklist Completed: patient identified, surgical consent, pre-op evaluation, timeout performed, IV checked, risks and benefits discussed and monitors and equipment checked  Epidural Patient position: sitting Prep: site prepped and draped and DuraPrep Patient monitoring: blood pressure, continuous pulse ox and heart rate Approach: midline Location: L3-L4 Injection technique: LOR air  Needle:  Needle type: Tuohy  Needle gauge: 17 G Needle length: 9 cm Needle insertion depth: 5 cm Catheter type: closed end flexible Catheter size: 19 Gauge Catheter at skin depth: 10 cm Test dose: negative (1% lidocaine)  Assessment Events: blood not aspirated, injection not painful, no injection resistance, negative IV test and no paresthesia  Additional Notes Pt identified in Labor room.  Monitors applied. Working IV access confirmed. Sterile prep, drape lumbar spine.  1% lido local L 3,4.  #17ga Touhy LOR air at 5 cm L 3,4, cath in easily to 10 cm skin. Test dose OK, cath dosed and infusion begun.  Patient asymptomatic, VSS, no heme aspirated, tolerated well.  Jenita Seashore, MD

## 2017-11-02 NOTE — Anesthesia Preprocedure Evaluation (Addendum)
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 C1 and C2 laminectomy is noted as well. There is no longer any mass effect on the upper cervical spinal cord. The cerebellar tonsils are rounded. The fourth ventricle is within normal limits    GI/Hepatic negative GI ROS, Neg liver ROS,   Endo/Other  Hypothyroidism (h/o thyroid cancer) Morbid obesity  Renal/GU negative Renal ROS     Musculoskeletal   Abdominal (+) + obese,   Peds  Hematology  (+) Blood dyscrasia (Hb 10.3, plt 278k), anemia ,   Anesthesia Other Findings   Reproductive/Obstetrics (+) Pregnancy                            Anesthesia Physical Anesthesia Plan  ASA: III  Anesthesia Plan: Epidural   Post-op Pain Management:    Induction:   PONV Risk Score and Plan: 3 and Treatment may vary due to age or medical condition  Airway Management Planned: Natural Airway  Additional Equipment:   Intra-op Plan:   Post-operative Plan:   Informed Consent: I have reviewed the patients History and Physical, chart, labs and discussed the procedure including the risks, benefits and alternatives for the proposed anesthesia with the patient or authorized representative who has indicated his/her understanding and acceptance.   Dental advisory given  Plan Discussed with:    Anesthesia Plan Comments: (Patient identified. Risks/Benefits/Options discussed with patient including but not limited to bleeding, infection, nerve damage, paralysis, failed block, incomplete pain control, headache, blood pressure changes, nausea, vomiting, reactions to medication both or allergic, itching and postpartum back pain. Confirmed with bedside nurse the patient's most recent platelet count. Confirmed with patient that they are not currently taking any anticoagulation, have any bleeding history or any family history of bleeding disorders. Patient expressed understanding and wished to proceed. All questions were answered. )        Anesthesia Quick Evaluation

## 2017-11-03 LAB — CBC
HEMATOCRIT: 25.5 % — AB (ref 36.0–46.0)
HEMOGLOBIN: 8.3 g/dL — AB (ref 12.0–15.0)
MCH: 32.4 pg (ref 26.0–34.0)
MCHC: 32.5 g/dL (ref 30.0–36.0)
MCV: 99.6 fL (ref 78.0–100.0)
Platelets: 200 10*3/uL (ref 150–400)
RBC: 2.56 MIL/uL — ABNORMAL LOW (ref 3.87–5.11)
RDW: 13.9 % (ref 11.5–15.5)
WBC: 10.4 10*3/uL (ref 4.0–10.5)

## 2017-11-03 MED ORDER — LEVOTHYROXINE SODIUM 175 MCG PO TABS
175.0000 ug | ORAL_TABLET | Freq: Every day | ORAL | Status: DC
  Administered 2017-11-04: 175 ug via ORAL
  Filled 2017-11-03 (×3): qty 1

## 2017-11-04 MED ORDER — IBUPROFEN 600 MG PO TABS: 600.0000 mg | ORAL_TABLET | Freq: Four times a day (QID) | ORAL | 0 refills | Status: DC

## 2017-11-04 MED ORDER — FERROUS SULFATE 325 (65 FE) MG PO TBEC: 325.0000 mg | DELAYED_RELEASE_TABLET | Freq: Two times a day (BID) | ORAL | 3 refills | Status: DC

## 2017-11-04 NOTE — Lactation Note (Signed)
This note was copied from a baby's chart. Lactation Consultation Note  Patient Name: Morgan Fleming GUYQI'H Date: 11/04/2017 Reason for consult: Follow-up assessment   P3, Baby 51 hours old. Mother states her breasts are feeling heavier. She is worried because she became engorged in the past. Provided education regarding reverse pressure massage, put back toward shower, ice and minimal pumping and bf on demand. Mom encouraged to feed baby 8-12 times/24 hours and with feeding cues.  Reviewed engorgement care and monitoring voids/stools. Mother has sore nipples, no cracks or abrasions. She is alternating w/ comfort gels and coconut oil.   Maternal Data    Feeding Feeding Type: Breast Fed Length of feed: 60 min(mother states baby has been cluster feeding )  LATCH Score Latch: Grasps breast easily, tongue down, lips flanged, rhythmical sucking.  Audible Swallowing: Spontaneous and intermittent  Type of Nipple: Everted at rest and after stimulation  Comfort (Breast/Nipple): Filling, red/small blisters or bruises, mild/mod discomfort  Hold (Positioning): No assistance needed to correctly position infant at breast.  LATCH Score: 9  Interventions    Lactation Tools Discussed/Used Tools: Pump;Flanges;Comfort gels;Coconut oil Flange Size: 27 Breast pump type: Manual Pump Review: Milk Storage;Setup, frequency, and cleaning   Consult Status Consult Status: Complete    Carlye Grippe 11/04/2017, 8:47 AM

## 2017-11-04 NOTE — Discharge Summary (Addendum)
OB Discharge Summary     Patient Name: Morgan Fleming DOB: 1980/12/26 MRN: [id number]  Date of admission: 11/01/2017 Delivering MD: Waymon Amato   Date of discharge: 11/04/2017  Admitting diagnosis: INDUCTION Intrauterine pregnancy: [redacted]w[redacted]d     Secondary diagnosis:  Active Problems:   Polyhydramnios affecting pregnancy in third trimester  Additional problems: none     Discharge diagnosis: Term Pregnancy Delivered, VBAC and Anemia   previous THYROIDECTOMY                                                                                             Post partum procedures:none  Augmentation: AROM and Pitocin  Complications: None  Hospital course:  Induction of Labor With Vaginal Delivery   37 y.o. yo [id number] at [redacted]w[redacted]d was admitted to the hospital 11/01/2017 for induction of labor.  Indication for induction: Polyhydramnios .  Patient had an uncomplicated labor course as follows: Membrane Rupture Time/Date: 4:04 AM ,11/02/2017   Intrapartum Procedures: Episiotomy: None [1]                                         Lacerations:  1st degree [2]  Patient had delivery of a Viable infant.  Information for the patient's newborn:  Vienne, Corcoran [[id number]]  Delivery Method: Vag-Spont Vacum assisted delivery. BB boy circ performed in hospital.    11/02/2017  Details of delivery can be found in separate delivery note.  Patient had a routine postpartum course. Patient is discharged home 11/04/17.  Physical exam  Vitals:   11/02/17 1738 11/02/17 2100 11/03/17 0633 11/03/17 1728  BP: 114/69 119/75 111/74 125/80  Pulse: 62 61 62 70  Resp: 18 18 18 18   Temp: 98.3 F (36.8 C) 98.2 F (36.8 C) 98.2 F (36.8 C) 98.4 F (36.9 C)  TempSrc: Oral Oral Oral Oral  SpO2:  100% 99%   Weight:      Height:       General: alert, cooperative and no distress Lochia: appropriate Uterine Fundus: firm Vaginal Laceration : Healing well with no significant drainage, No significant erythema DVT  Evaluation: No evidence of DVT seen on physical exam. Negative Homan's sign. No cords or calf tenderness. No significant calf/ankle edema. Labs: Lab Results  Component Value Date   WBC 10.4 11/03/2017   HGB 8.3 (L) 11/03/2017   HCT 25.5 (L) 11/03/2017   MCV 99.6 11/03/2017   PLT 200 11/03/2017   CMP Latest Ref Rng & Units 05/17/2016  Glucose 65 - 99 mg/dL 80  BUN 6 - 20 mg/dL 12  Creatinine 0.44 - 1.00 mg/dL 0.83  Sodium 135 - 145 mmol/L 138  Potassium 3.5 - 5.1 mmol/L 4.1  Chloride 101 - 111 mmol/L 109  CO2 22 - 32 mmol/L 22  Calcium 8.9 - 10.3 mg/dL 8.9    Discharge instruction: per After Visit Summary and "Baby and Me Booklet".  After visit meds:  Allergies as of 11/04/2017      Reactions   Mometasone Furoate Swelling   Only lips  and nose.   Montelukast Sodium Nausea And Vomiting      Medication List    TAKE these medications   Albuterol Sulfate 108 (90 Base) MCG/ACT Aepb Inhale 2 puffs into the lungs every 6 (six) hours as needed (shortness of breath).   ferrous sulfate 325 (65 FE) MG EC tablet Take 1 tablet (325 mg total) by mouth 2 (two) times daily.   Fluticasone-Salmeterol 100-50 MCG/DOSE Aepb Commonly known as:  ADVAIR Inhale 1 puff into the lungs every morning.   ibuprofen 600 MG tablet Commonly known as:  ADVIL,MOTRIN Take 1 tablet (600 mg total) by mouth every 6 (six) hours.   SYNTHROID 175 MCG tablet Generic drug:  levothyroxine Take 1 tablet (175 mcg total) by mouth daily before breakfast.      PP Anemia: Take Iron   Past thyroidectomy: Continue synthroid.   Diet: routine diet  Activity: Advance as tolerated. Pelvic rest for 6 weeks.   Outpatient follow up:6 weeks Follow up Appt: Future Appointments  Date Time Provider [place]  11/26/2017 11:00 AM Philemon Kingdom, MD [facility] None   Follow up Visit:No follow-ups on file.  Postpartum contraception: Vasectomy for husband   Newborn Data: Live born female  Birth Weight:  8 lb 10.6 oz (3929 g) APGAR: 77, 9  Newborn Delivery   Birth date/time:  11/02/2017 06:47:00 Delivery type:  VBAC, Vacuum Assisted     Baby Feeding: Breast Disposition:home with mother  Pt discharge in stable condition.    11/04/2017 Noralyn Pick, FNP

## 2017-11-26 ENCOUNTER — Ambulatory Visit (INDEPENDENT_AMBULATORY_CARE_PROVIDER_SITE_OTHER): Payer: BC Managed Care – PPO | Admitting: Internal Medicine

## 2017-11-26 VITALS — BP 138/98 | HR 88 | Ht 63.0 in | Wt 169.4 lb

## 2017-11-26 DIAGNOSIS — C73 Malignant neoplasm of thyroid gland: Secondary | ICD-10-CM | POA: Diagnosis not present

## 2017-11-26 DIAGNOSIS — E890 Postprocedural hypothyroidism: Secondary | ICD-10-CM

## 2017-11-26 LAB — TSH: TSH: 0.05 u[IU]/mL — ABNORMAL LOW (ref 0.35–4.50)

## 2017-11-26 LAB — T4, FREE: Free T4: 1.51 ng/dL (ref 0.60–1.60)

## 2017-11-26 NOTE — Patient Instructions (Addendum)
Please stop at the lab.  Continue Synthroid 175 mcg 6/7 days and 263 mcg 1/7 days.  Take the thyroid hormone every day, with water, at least 30 minutes before breakfast, separated by at least 4 hours from: - acid reflux medications - calcium - iron - multivitamins  Please come back for a follow-up appointment 1 year.

## 2017-11-26 NOTE — Progress Notes (Signed)
Patient ID: Morgan Fleming, female   DOB: 1980/10/21, 37 y.o.   MRN: [id number]   HPI  Morgan Fleming is a 37 y.o.-year-old female, initially referred by her ObGyn, Dr. Ena Dawley, returning for f/u for h/o FV of PTC and postsurgical hypothyroidism. Last visit 5 months ago.  She gave birth to a healthy boy on 11/02/2017. She is breastfeeding.  Reviewed cancer history: Thyroid cancer history: At the beginning of 06/2014 she noticed a thyroid nodule in the mirror and saw her OB/GYN doctor who ordered a thyroid ultrasound.  - 07/14/2014: Thyroid U/S:  Right thyroid lobe : 56 x 26 x 35 mm. Dominant 32 x 22 x 35 mm nodule, inferior pole. 4 x 3 x 5 mm hypoechoic nodule, superior pole.  Left thyroid lobe: 45 x 14 x 13 mm. Three small cysts, all less than 5 mm maximum diameter.  Isthmus Thickness: 5 mm. No nodules visualized.  Lymphadenopathy: None visualized.  - 08/12/2014: FNA of the right thyroid nodule Adequacy Reason Satisfactory For Evaluation. Diagnosis THYROID, RIGHT, FINE NEEDLE ASPIRATION (SPECIMEN 1 OF 1, COLLECTED ON 08/12/2014): ATYPIA OF UNDETERMINED SIGNIFICANCE / FOLLICULAR LESION OF UNDETERMINED SIGNIFICANCE (BETHESDA CATEGORY III). SEE COMMENT. 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 - RESECTION MARGIN, NEGATIVE FOR ATYPIA OR MALIGNANCY. PLEASE SEE ONCOLOGY TEMPLATE FOR DETAILS. - TWO PARATHYROID GLANDS, NO EVIDENCE OF NEOPLASM. Specimen: Right thyroid gland. Procedure: Right hemi-thyroidectomy. Specimen Integrity (intact/fragmented):  Intact. Tumor focality: Unifocal. Dominant tumor: Maximum tumor size (cm): 3.0 cm. Tumor laterality: Right thyroid. Histologic type (including subtype and/or unique features as applicable): Follicular variant of papillary thyroid carcinoma. Tumor capsule: Not identified. Extrathyroidal extension: No. Margins: Negative. Lymph - Vascular invasion: Not identified. Capsular invasion with degree of invasion if present: N/A. Lymph nodes: # examined N/A; # positive; N/A. TNM code: pT2, pNX  - 09/25/2014: completion thyroidectomy - As she was breast-feeding, we delayed RAI tx (she had 2 small boys at the time), but had this with Tg 09/16/2015 >> WBS 09/24/2015 >> no metastases.  Thyroglobulin levels have been low: Component Value Date   THYROGLB 0.1 (L) 12/26/2016   THYROGLB 0.1 (L) 12/16/2015   THGAB <1 12/26/2016   THGAB <1 12/16/2015   Patient's TFTs were reviewed: Lab Results  Component Value Date   TSH 2.70 10/03/2017   TSH 2.30 08/24/2017   TSH 3.92 07/26/2017   TSH 6.53 (H) 06/28/2017   TSH 2.67 05/22/2017   FREET4 0.82 10/03/2017   FREET4 0.75 08/24/2017   FREET4 0.92 07/26/2017   FREET4 0.82 06/28/2017   FREET4 0.83 05/22/2017  07/28/2014: TSH 0.916   At the beginning of the pregnancy, she was on the following dose: 137 mcg 6/7, 2x137 mcg 1/7 (~156 mcg daily). Pt is now on Synthroid d.a.w. 175 Mcg daily + 175 + 87 mcg >> 263, taken: - in am - fasting - at least 30 min from b'fast - no Ca, Fe, PPIs, MVI - not on Biotin  She has SOB and wheezing (asthma).    ROS: Constitutional: no weight gain/no weight loss, no fatigue, + subjective hyperthermia, no subjective hypothermia Eyes: no blurry vision, no xerophthalmia ENT: no sore throat, no  nodules palpated in throat, no dysphagia, no odynophagia, no hoarseness Cardiovascular: no CP/no SOB/no palpitations/no leg swelling Respiratory: no cough/no SOB/no wheezing Gastrointestinal: no N/no V/no D/no C/no acid  reflux Musculoskeletal: no muscle aches/no joint aches Skin: no rashes, no hair loss Neurological: no tremors/no numbness/no tingling/no dizziness  I reviewed pt's medications, allergies, PMH, social hx, family hx, and changes were documented in the history of present illness. Otherwise, unchanged from my initial visit note.  Past Medical History:  Diagnosis Date  . Anemia   . Arnold-Chiari malformation ([place]) 04/15/2015  . Asthma    daily and prn inhaler  . Breast feeding status of mother    instructed to pump and discard x 24 hrs. post-op  . Cancer ([place])    thyroid  . Fibroid   . Headache 04/01/2015   Traction headache  . Hypothyroidism   . PONV (postoperative nausea and vomiting)    vomiting  . Thyroid neoplasm   . Umbilical hernia    Past Surgical History:  Procedure Laterality Date  . CESAREAN SECTION N/A 06/29/2014   Procedure: CESAREAN SECTION;  Surgeon: Crawford Givens, MD;  Location: [facility];  Service: Obstetrics;  Laterality: N/A;  . DILATION AND CURETTAGE OF UTERUS  03/19/2009   suction D & C, polypectomy  . HERNIA REPAIR    . SKIN TAG REMOVAL  1996   right ear  . SUBOCCIPITAL CRANIECTOMY CERVICAL LAMINECTOMY N/A 05/22/2016   Procedure: CHIARI DECOMPRESSION CERVICAL ONE-TWO DECOMPRESSION LAMINECTOMY;  Surgeon: Kary Kos, MD;  Location: [facility];  Service: Neurosurgery;  Laterality: N/A;  . THYROID LOBECTOMY Right 09/10/2014   Procedure: RIGHT THYROID LOBECTOMY;  Surgeon: Armandina Gemma, MD;  Location: [facility];  Service: General;  Laterality: Right;  . THYROIDECTOMY N/A 09/25/2014   Procedure: COMPLETION THYROIDECTOMY ;  Surgeon: Armandina Gemma, MD;  Location: [facility];  Service: General;  Laterality: N/A;  . UMBILICAL HERNIA REPAIR  05/29/2011   Procedure: HERNIA REPAIR UMBILICAL ADULT;  Surgeon: Belva Crome, MD;  Location: [facility];  Service: General;  Laterality: N/A;  Umbilical Hernia repair with mesh patch  . VACUUM ASSISTED VAGINAL DELIVERY  10/06/2010    repair of second degree midline lac.  . WISDOM TOOTH EXTRACTION     History   Social History  . Marital Status: Married    Spouse Name: N/A  . Number of Children: 2   Occupational History  .  school counselor    Social History Main Topics  . Smoking status: Never Smoker   . Smokeless tobacco: Never Used  . Alcohol Use: No  . Drug Use: No  . Sexual Activity: Yes   Current Outpatient Medications on File Prior to Visit  Medication Sig Dispense Refill  . Albuterol Sulfate 108 (90 BASE) MCG/ACT AEPB Inhale 2 puffs into the lungs every 6 (six) hours as needed (shortness of breath).     . ferrous sulfate 325 (65 FE) MG EC tablet Take 1 tablet (325 mg total) by mouth 2 (two) times daily. 60 tablet 3  . Fluticasone-Salmeterol (ADVAIR) 100-50 MCG/DOSE AEPB Inhale 1 puff into the lungs every morning.     Marland Kitchen ibuprofen (ADVIL,MOTRIN) 600 MG tablet Take 1 tablet (600 mg total) by mouth every 6 (six) hours. 30 tablet 0  . SYNTHROID 175 MCG tablet Take 1 tablet (175 mcg total) by mouth daily before breakfast. 35 tablet 5   No current facility-administered medications on file prior to visit.    Allergies  Allergen Reactions  .  Mometasone Furoate Swelling    Only lips and nose.  . Montelukast Sodium Nausea And Vomiting   Family History  Problem Relation Age of Onset  . Hypertension Mother   . Bell's palsy Mother   . Thyroid disease Mother   . Hypertension Father   . Diabetes Father    PE: BP (!) 138/98   Pulse 88   Ht _0  (1.6 m)   Wt 169 lb 6.4 oz (76.8 kg)   LMP 11/29/2016   SpO2 94%   Breastfeeding? Yes   BMI 30.01 kg/m  Body mass index is 30.01 kg/m.  Wt Readings from Last 3 Encounters:  11/26/17 169 lb 6.4 oz (76.8 kg)  11/01/17 192 lb 6.4 oz (87.3 kg)  06/28/17 168 lb 6.4 oz (76.4 kg)   Constitutional: overweight, in NAD Eyes: PERRLA, EOMI, no exophthalmos ENT: moist mucous membranes, thyroidectomy scar healed, no neck masses palpated , no cervical  lymphadenopathy Cardiovascular: RRR, No MRG Respiratory: CTA B Gastrointestinal: abdomen soft, NT, ND, BS+ Musculoskeletal: no deformities, strength intact in all 4 Skin: moist, warm, no rashes Neurological: no tremor with outstretched hands, DTR normal in all 4  ASSESSMENT: 1. Follicular PTC  2. Postsurgical hypothyroidism  PLAN: 1. Follicular variant of PTC  Patient has a history of total thyroidectomy for follicular variant of papillary thyroid cancer and had RAI treatment 08/2015.  Posttreatment whole body scan was negative for metastases or recurrences.  She is at low risk for complications from her thyroid cancer as the follicular variant of PTC is the most benign of the PTC types and she is also stage I because of age.  Her tumor was not encapsulated and was large (3 cm) and that is why I suggested RAI treatment, which we did with Thyrogen.  No neck masses palpated or felt by patient; no neck compression symptoms  Latest thyroglobulin was 0.1, low  We will repeat a thyroglobulin and ATA now  I will see her back in a year  2. Postop hypothyroidism - latest thyroid labs reviewed with pt >> normal in 09/2017: Lab Results  Component Value Date   TSH 2.70 10/03/2017  - she continues on LT4 175 mcg 6/7 and 263 mcg 1/7 days - pt feels good on this dose. - we discussed about taking the thyroid hormone every day, with water, >30 minutes before breakfast, separated by >4 hours from acid reflux medications, calcium, iron, multivitamins. Pt. is taking it correctly. - will check thyroid tests today: TSH and fT4 - If labs are abnormal, she will need to return for repeat TFTs in 1.5 months  Needs refills.  Component     Latest Ref Rng & Units 11/26/2017  Thyroglobulin     ng/mL 0.1 (L)  TSH     0.35 - 4.50 uIU/mL 0.05 (L)  T4,Free(Direct)     0.60 - 1.60 ng/dL 1.51  Thyroglobulin Ab     < or = 1 IU/mL <1   TSH is too suppressed >> will decrease LT4 dose to 150 mcg daily and  recheck TFTs in 1.5 mo.  CC:  Dr. Crawford Givens  Philemon Kingdom, MD PhD [facility] Endocrinology

## 2017-11-27 ENCOUNTER — Encounter: Payer: Self-pay | Admitting: Internal Medicine

## 2017-11-27 LAB — THYROGLOBULIN LEVEL: Thyroglobulin: 0.1 ng/mL — ABNORMAL LOW

## 2017-11-27 LAB — THYROGLOBULIN ANTIBODY

## 2017-11-27 MED ORDER — SYNTHROID 150 MCG PO TABS: 150.0000 ug | ORAL_TABLET | Freq: Every day | ORAL | 3 refills | Status: DC

## 2018-01-09 ENCOUNTER — Other Ambulatory Visit (INDEPENDENT_AMBULATORY_CARE_PROVIDER_SITE_OTHER): Payer: BC Managed Care – PPO

## 2018-01-09 DIAGNOSIS — E890 Postprocedural hypothyroidism: Secondary | ICD-10-CM | POA: Diagnosis not present

## 2018-01-09 LAB — TSH: TSH: 0.11 u[IU]/mL — ABNORMAL LOW (ref 0.35–4.50)

## 2018-01-09 LAB — T4, FREE: Free T4: 1.27 ng/dL (ref 0.60–1.60)

## 2018-01-10 ENCOUNTER — Other Ambulatory Visit: Payer: Self-pay | Admitting: Internal Medicine

## 2018-01-10 DIAGNOSIS — E890 Postprocedural hypothyroidism: Secondary | ICD-10-CM

## 2018-01-10 MED ORDER — SYNTHROID 137 MCG PO TABS: 137.0000 ug | ORAL_TABLET | Freq: Every day | ORAL | 3 refills | Status: DC

## 2018-02-22 ENCOUNTER — Other Ambulatory Visit (INDEPENDENT_AMBULATORY_CARE_PROVIDER_SITE_OTHER): Payer: BC Managed Care – PPO

## 2018-02-22 DIAGNOSIS — E890 Postprocedural hypothyroidism: Secondary | ICD-10-CM

## 2018-02-22 LAB — T4, FREE: FREE T4: 0.94 ng/dL (ref 0.60–1.60)

## 2018-02-22 LAB — TSH: TSH: 0.92 u[IU]/mL (ref 0.35–4.50)

## 2018-03-22 ENCOUNTER — Ambulatory Visit: Payer: Self-pay | Admitting: Surgery

## 2018-05-29 ENCOUNTER — Encounter: Payer: Self-pay | Admitting: Internal Medicine

## 2018-05-29 MED ORDER — SYNTHROID 137 MCG PO TABS: 137.0000 ug | ORAL_TABLET | Freq: Every day | ORAL | 1 refills | Status: DC

## 2018-05-31 NOTE — Patient Instructions (Signed)
MARQUIA COSTELLO  05/31/2018   Your procedure is scheduled on: 06-07-18    Report to [facility] Main  Entrance    Report to Admitting at 5:30 AM    Call this number if you have problems the morning of surgery [phone]    Remember: Do not eat food or drink liquids :After Midnight.    BRUSH YOUR TEETH MORNING OF SURGERY AND RINSE YOUR MOUTH OUT, NO CHEWING GUM CANDY OR MINTS.     Take these medicines the morning of surgery with A SIP OF WATER: Synthroid. You may also use and bring your inhaler as needed                                You may not have any metal on your body including hair pins and              piercings  Do not wear jewelry, make-up, lotions, powders or perfumes, deodorant             Do not wear nail polish.  Do not shave  48 hours prior to surgery.                 Do not bring valuables to the hospital. [place].  Contacts, dentures or bridgework may not be worn into surgery.  Leave suitcase in the car. After surgery it may be brought to your room.                Please read over the following fact sheets you were given: _____________________________________________________________________             [facility] - Preparing for Surgery Before surgery, you can play an important role.  Because skin is not sterile, your skin needs to be as free of germs as possible.  You can reduce the number of germs on your skin by washing with CHG (chlorahexidine gluconate) soap before surgery.  CHG is an antiseptic cleaner which kills germs and bonds with the skin to continue killing germs even after washing. Please DO NOT use if you have an allergy to CHG or antibacterial soaps.  If your skin becomes reddened/irritated stop using the CHG and inform your nurse when you arrive at Short Stay. Do not shave (including legs and underarms) for at least 48 hours prior to the first CHG shower.  You may  shave your face/neck. Please follow these instructions carefully:  1.  Shower with CHG Soap the night before surgery and the  morning of Surgery.  2.  If you choose to wash your hair, wash your hair first as usual with your  normal  shampoo.  3.  After you shampoo, rinse your hair and body thoroughly to remove the  shampoo.                           4.  Use CHG as you would any other liquid soap.  You can apply chg directly  to the skin and wash                       Gently with a scrungie or clean washcloth.  5.  Apply  the CHG Soap to your body ONLY FROM THE NECK DOWN.   Do not use on face/ open                           Wound or open sores. Avoid contact with eyes, ears mouth and genitals (private parts).                       Wash face,  Genitals (private parts) with your normal soap.             6.  Wash thoroughly, paying special attention to the area where your surgery  will be performed.  7.  Thoroughly rinse your body with warm water from the neck down.  8.  DO NOT shower/wash with your normal soap after using and rinsing off  the CHG Soap.                9.  Pat yourself dry with a clean towel.            10.  Wear clean pajamas.            11.  Place clean sheets on your bed the night of your first shower and do not  sleep with pets. Day of Surgery : Do not apply any lotions/deodorants the morning of surgery.  Please wear clean clothes to the hospital/surgery center.  FAILURE TO FOLLOW THESE INSTRUCTIONS MAY RESULT IN THE CANCELLATION OF YOUR SURGERY PATIENT SIGNATURE_________________________________  NURSE SIGNATURE__________________________________  ________________________________________________________________________

## 2018-06-03 ENCOUNTER — Encounter (HOSPITAL_COMMUNITY)
Admission: RE | Admit: 2018-06-03 | Discharge: 2018-06-03 | Disposition: A | Discharge status: Home / Self Care | Payer: BC Managed Care – PPO | Source: Ambulatory Visit | Attending: Surgery | Admitting: Surgery

## 2018-06-03 ENCOUNTER — Encounter (HOSPITAL_COMMUNITY): Payer: Self-pay

## 2018-06-03 ENCOUNTER — Other Ambulatory Visit: Payer: Self-pay

## 2018-06-03 ENCOUNTER — Encounter (HOSPITAL_COMMUNITY): Payer: Self-pay | Admitting: Surgery

## 2018-06-03 DIAGNOSIS — Z01812 Encounter for preprocedural laboratory examination: Secondary | ICD-10-CM | POA: Insufficient documentation

## 2018-06-03 DIAGNOSIS — K432 Incisional hernia without obstruction or gangrene: Secondary | ICD-10-CM | POA: Diagnosis present

## 2018-06-03 LAB — CBC
HCT: 41 % (ref 36.0–46.0)
HEMOGLOBIN: 13.1 g/dL (ref 12.0–15.0)
MCH: 31.3 pg (ref 26.0–34.0)
MCHC: 32 g/dL (ref 30.0–36.0)
MCV: 98.1 fL (ref 80.0–100.0)
Platelets: 238 10*3/uL (ref 150–400)
RBC: 4.18 MIL/uL (ref 3.87–5.11)
RDW: 12.6 % (ref 11.5–15.5)
WBC: 4.5 10*3/uL (ref 4.0–10.5)
nRBC: 0 % (ref 0.0–0.2)

## 2018-06-03 LAB — PREGNANCY, URINE: Preg Test, Ur: NEGATIVE

## 2018-06-03 NOTE — H&P (Signed)
General Surgery [facility]  Morgan Fleming DOB: 04-Jul-1980 Married / Language: English / Race: Black or African American Female   History of Present Illness  The patient is a 37 year old female who presents with an incisional hernia.  CHIEF COMPLAINT: incisional hernia  Patient returns to our practice for evaluation and management of incisional hernia. Patient had originally undergone an umbilical hernia repair with mesh patch by my partner, Dr. Judeth Horn. This was performed in 2016. Patient is known to my practice with a history of thyroid carcinoma having undergone thyroidectomy. Patient has recently given birth to her third child. She had noted a small recurrence of her hernia at the umbilicus after the birth of her second child. It is now continued to enlarge and is becoming symptomatic. Patient wishes to be evaluated for repair. Patient remains physically very active. She works out at [employer] several days a week. Her only other abdominal surgery was cesarean section. Hernia repair performed by Dr. Hulen Skains utilized a mesh patch.   Problem List/Past Medical  THYROID CARCINOMA (C73)  NEOPLASM OF UNCERTAIN BEHAVIOR OF THYROID GLAND (D44.0)  INCISIONAL HERNIA OF ANTERIOR ABDOMINAL WALL WITHOUT OBSTRUCTION OR GANGRENE (K43.2)  HISTORY OF THYROID CANCER (Z85.850)   Past Surgical History  Thyroid Surgery  Cesarean Section - 1   Diagnostic Studies History  Colonoscopy  never Mammogram  never Pap Smear  1-5 years ago  Allergies Nasonex *NASAL AGENTS - SYSTEMIC AND TOPICAL*  Singulair *ANTIASTHMATIC AND BRONCHODILATOR AGENTS*   Medication History Synthroid (137MCG Tablet, Oral) Active. NuvaRing (0.12-0.015MG /24HR Ring, Vaginal daily) Active. Advair Diskus (100-50MCG/DOSE Aero Pow Br Act, Inhalation) Active. ProAir HFA (108 (90 Base)MCG/ACT Aerosol Soln, Inhalation) Active. Medications Reconciled  Social History  Alcohol use   Occasional alcohol use. Caffeine use  Coffee, Tea. No drug use  Tobacco use  Never smoker.  Family History  Arthritis  Father, Mother. Diabetes Mellitus  Family Members In General. Hypertension  Father, Mother. Respiratory Condition  Family Members In General. Seizure disorder  Family Members In General. Thyroid problems  Mother.  Pregnancy / Birth History Gravida  4 Maternal age  60-30 Para  2 Regular periods   Other Problems Asthma  Umbilical Hernia Repair   Vitals Weight: 160 lb Height: 63in Body Surface Area: 1.76 m Body Mass Index: 28.34 kg/m  Temp.: 18F(Temporal)  BP: 112/68 (Sitting, Right Arm, Standard)  Physical Exam  See vital signs recorded above  GENERAL APPEARANCE Development: normal Nutritional status: normal Gross deformities: none  SKIN Rash, lesions, ulcers: none Induration, erythema: none Nodules: none palpable  EYES Conjunctiva and lids: normal Pupils: equal and reactive Iris: normal bilaterally  EARS, NOSE, MOUTH, THROAT External ears: no lesion or deformity External nose: no lesion or deformity Hearing: grossly normal Lips: no lesion or deformity Dentition: normal for age Oral mucosa: moist  NECK Symmetric: yes Trachea: midline Thyroid: no palpable nodules in the thyroid bed  CHEST Respiratory effort: normal Retraction or accessory muscle use: no Breath sounds: normal bilaterally Rales, rhonchi, wheeze: none  CARDIOVASCULAR Auscultation: regular rhythm, normal rate Murmurs: none Pulses: carotid and radial pulse 2+ palpable Lower extremity edema: none Lower extremity varicosities: none  ABDOMEN Distension: none Masses: none palpable Tenderness: none Hepatosplenomegaly: not present Hernia: Above the level of the umbilicus and slightly to the left of midline is an obvious bulge which augments with coughing and Valsalva. This reduces at least partially. There is also a bulge with a small  defect in the midline above  this level. There also is laxity and a slight bulge at the level of the umbilicus. There is no significant tenderness.  MUSCULOSKELETAL Station and gait: normal Digits and nails: no clubbing or cyanosis Muscle strength: grossly normal all extremities Range of motion: grossly normal all extremities Deformity: none  LYMPHATIC Cervical: none palpable Supraclavicular: none palpable  PSYCHIATRIC Oriented to person, place, and time: yes Mood and affect: normal for situation Judgment and insight: appropriate for situation   Assessment & Plan  INCISIONAL HERNIA OF ANTERIOR ABDOMINAL WALL WITHOUT OBSTRUCTION OR GANGRENE (K43.2) Current Plans Pt Education - CCS Mesh education: discussed with patient and provided information. Patient presents today with incisional hernia. We discussed her abdominal surgical history. We discussed the fact that her mesh had been used in her prior hernia repair. It appears there may be multiple developing defects in the abdominal wall. I have recommended proceeding with laparoscopic ventral incisional hernia repair using a large mesh patch. We have discussed the location of the surgical incisions. We have discussed the use of mesh. We have discussed the possibility of recurrence being up to 10%. We have discussed restrictions on her activities following the procedure. We discussed the hospital stay to be anticipated. We have discussed her time out of work. The patient understands and wishes to proceed with surgery later this year.  The risks and benefits of the procedure have been discussed at length with the patient. The patient understands the proposed procedure, potential alternative treatments, and the course of recovery to be expected. All of the patient's questions have been answered at this time. The patient wishes to proceed with surgery.   Armandina Gemma, [facility] Office: [phone]

## 2018-06-06 ENCOUNTER — Encounter (HOSPITAL_COMMUNITY): Payer: Self-pay | Admitting: Surgery

## 2018-06-06 NOTE — Anesthesia Preprocedure Evaluation (Addendum)
Anesthesia Evaluation  Patient identified by MRN, date of birth, ID band Patient awake    Reviewed: Allergy & Precautions, NPO status , Patient's Chart, lab work & pertinent test results  History of Anesthesia Complications (+) PONV  Airway Mallampati: II  TM Distance: >3 FB Neck ROM: Full    Dental no notable dental hx.    Pulmonary asthma ,    Pulmonary exam normal breath sounds clear to auscultation       Cardiovascular negative cardio ROS Normal cardiovascular exam Rhythm:Regular Rate:Normal     Neuro/Psych negative neurological ROS  negative psych ROS   GI/Hepatic negative GI ROS, Neg liver ROS,   Endo/Other  Hypothyroidism   Renal/GU negative Renal ROS  negative genitourinary   Musculoskeletal negative musculoskeletal ROS (+)   Abdominal   Peds negative pediatric ROS (+)  Hematology negative hematology ROS (+)   Anesthesia Other Findings   Reproductive/Obstetrics negative OB ROS                            Anesthesia Physical Anesthesia Plan  ASA: II  Anesthesia Plan: General   Post-op Pain Management:    Induction: Intravenous  PONV Risk Score and Plan: 4 or greater and Ondansetron, Dexamethasone, Treatment may vary due to age or medical condition, Midazolam and Scopolamine patch - Pre-op  Airway Management Planned: Oral ETT  Additional Equipment:   Intra-op Plan:   Post-operative Plan: Extubation in OR  Informed Consent: I have reviewed the patients History and Physical, chart, labs and discussed the procedure including the risks, benefits and alternatives for the proposed anesthesia with the patient or authorized representative who has indicated his/her understanding and acceptance.   Dental advisory given  Plan Discussed with: CRNA and Surgeon  Anesthesia Plan Comments:         Anesthesia Quick Evaluation

## 2018-06-07 ENCOUNTER — Observation Stay (HOSPITAL_COMMUNITY)
Admission: RE | Admit: 2018-06-07 | Discharge: 2018-06-08 | Disposition: A | Discharge status: Home / Self Care | Payer: BC Managed Care – PPO | Attending: Surgery | Admitting: Surgery

## 2018-06-07 ENCOUNTER — Encounter (HOSPITAL_COMMUNITY)
Admission: RE | Disposition: A | Discharge status: Home / Self Care | Payer: Self-pay | Source: Home / Self Care | Attending: Surgery

## 2018-06-07 ENCOUNTER — Encounter (HOSPITAL_COMMUNITY): Payer: Self-pay

## 2018-06-07 ENCOUNTER — Ambulatory Visit (HOSPITAL_COMMUNITY): Payer: BC Managed Care – PPO | Admitting: Anesthesiology

## 2018-06-07 ENCOUNTER — Other Ambulatory Visit: Payer: Self-pay

## 2018-06-07 DIAGNOSIS — Z85850 Personal history of malignant neoplasm of thyroid: Secondary | ICD-10-CM | POA: Insufficient documentation

## 2018-06-07 DIAGNOSIS — Z8349 Family history of other endocrine, nutritional and metabolic diseases: Secondary | ICD-10-CM | POA: Diagnosis not present

## 2018-06-07 DIAGNOSIS — Z79899 Other long term (current) drug therapy: Secondary | ICD-10-CM | POA: Diagnosis not present

## 2018-06-07 DIAGNOSIS — K432 Incisional hernia without obstruction or gangrene: Secondary | ICD-10-CM | POA: Diagnosis not present

## 2018-06-07 DIAGNOSIS — Z8261 Family history of arthritis: Secondary | ICD-10-CM | POA: Insufficient documentation

## 2018-06-07 DIAGNOSIS — Z836 Family history of other diseases of the respiratory system: Secondary | ICD-10-CM | POA: Diagnosis not present

## 2018-06-07 DIAGNOSIS — Z833 Family history of diabetes mellitus: Secondary | ICD-10-CM | POA: Diagnosis not present

## 2018-06-07 DIAGNOSIS — Z820 Family history of epilepsy and other diseases of the nervous system: Secondary | ICD-10-CM | POA: Diagnosis not present

## 2018-06-07 DIAGNOSIS — Z791 Long term (current) use of non-steroidal anti-inflammatories (NSAID): Secondary | ICD-10-CM | POA: Diagnosis not present

## 2018-06-07 DIAGNOSIS — Z8249 Family history of ischemic heart disease and other diseases of the circulatory system: Secondary | ICD-10-CM | POA: Diagnosis not present

## 2018-06-07 DIAGNOSIS — J45909 Unspecified asthma, uncomplicated: Secondary | ICD-10-CM | POA: Diagnosis not present

## 2018-06-07 HISTORY — PX: VENTRAL HERNIA REPAIR: SHX424

## 2018-06-07 HISTORY — PX: INSERTION OF MESH: SHX5868

## 2018-06-07 SURGERY — REPAIR, HERNIA, VENTRAL, LAPAROSCOPIC
Anesthesia: General | Site: Abdomen

## 2018-06-07 MED ORDER — CHLORHEXIDINE GLUCONATE CLOTH 2 % EX PADS: 6.0000 | MEDICATED_PAD | Freq: Once | CUTANEOUS | Status: DC

## 2018-06-07 MED ORDER — ACETAMINOPHEN 650 MG RE SUPP: 650.0000 mg | Freq: Four times a day (QID) | RECTAL | Status: DC | PRN

## 2018-06-07 MED ORDER — KETOROLAC TROMETHAMINE 30 MG/ML IJ SOLN
30.0000 mg | Freq: Once | INTRAMUSCULAR | Status: AC | PRN
  Administered 2018-06-07: 30 mg via INTRAVENOUS

## 2018-06-07 MED ORDER — HYDROMORPHONE HCL 1 MG/ML IJ SOLN: 1.0000 mg | INTRAMUSCULAR | Status: DC | PRN

## 2018-06-07 MED ORDER — HYDROMORPHONE HCL 1 MG/ML IJ SOLN
0.2500 mg | INTRAMUSCULAR | Status: DC | PRN
  Administered 2018-06-07 (×4): 0.5 mg via INTRAVENOUS

## 2018-06-07 MED ORDER — FENTANYL CITRATE (PF) 100 MCG/2ML IJ SOLN
INTRAMUSCULAR | Status: AC
  Filled 2018-06-07: qty 2

## 2018-06-07 MED ORDER — LIDOCAINE 2% (20 MG/ML) 5 ML SYRINGE
INTRAMUSCULAR | Status: AC
  Filled 2018-06-07: qty 5

## 2018-06-07 MED ORDER — PROPOFOL 10 MG/ML IV BOLUS
INTRAVENOUS | Status: AC
  Filled 2018-06-07: qty 20

## 2018-06-07 MED ORDER — ACETAMINOPHEN 325 MG PO TABS: 650.0000 mg | ORAL_TABLET | Freq: Four times a day (QID) | ORAL | Status: DC | PRN

## 2018-06-07 MED ORDER — ONDANSETRON HCL 4 MG/2ML IJ SOLN
INTRAMUSCULAR | Status: DC | PRN
  Administered 2018-06-07: 4 mg via INTRAVENOUS

## 2018-06-07 MED ORDER — DEXAMETHASONE SODIUM PHOSPHATE 10 MG/ML IJ SOLN
INTRAMUSCULAR | Status: DC | PRN
  Administered 2018-06-07: 10 mg via INTRAVENOUS

## 2018-06-07 MED ORDER — PROMETHAZINE HCL 25 MG/ML IJ SOLN: 6.2500 mg | INTRAMUSCULAR | Status: DC | PRN

## 2018-06-07 MED ORDER — ROCURONIUM BROMIDE 10 MG/ML (PF) SYRINGE
PREFILLED_SYRINGE | INTRAVENOUS | Status: DC | PRN
  Administered 2018-06-07: 10 mg via INTRAVENOUS
  Administered 2018-06-07: 50 mg via INTRAVENOUS
  Administered 2018-06-07: 10 mg via INTRAVENOUS

## 2018-06-07 MED ORDER — MIDAZOLAM HCL 5 MG/5ML IJ SOLN
INTRAMUSCULAR | Status: DC | PRN
  Administered 2018-06-07: 2 mg via INTRAVENOUS

## 2018-06-07 MED ORDER — HYDROMORPHONE HCL 1 MG/ML IJ SOLN
INTRAMUSCULAR | Status: AC
  Filled 2018-06-07: qty 2

## 2018-06-07 MED ORDER — KCL IN DEXTROSE-NACL 20-5-0.45 MEQ/L-%-% IV SOLN
INTRAVENOUS | Status: DC
  Administered 2018-06-07 – 2018-06-08 (×2): via INTRAVENOUS
  Filled 2018-06-07 (×2): qty 1000

## 2018-06-07 MED ORDER — MIDAZOLAM HCL 2 MG/2ML IJ SOLN
INTRAMUSCULAR | Status: AC
  Filled 2018-06-07: qty 2

## 2018-06-07 MED ORDER — ROCURONIUM BROMIDE 10 MG/ML (PF) SYRINGE
PREFILLED_SYRINGE | INTRAVENOUS | Status: AC
  Filled 2018-06-07: qty 10

## 2018-06-07 MED ORDER — KETOROLAC TROMETHAMINE 30 MG/ML IJ SOLN
INTRAMUSCULAR | Status: AC
  Filled 2018-06-07: qty 1

## 2018-06-07 MED ORDER — PROPOFOL 10 MG/ML IV BOLUS
INTRAVENOUS | Status: DC | PRN
  Administered 2018-06-07: 200 mg via INTRAVENOUS

## 2018-06-07 MED ORDER — ONDANSETRON 4 MG PO TBDP: 4.0000 mg | ORAL_TABLET | Freq: Four times a day (QID) | ORAL | Status: DC | PRN

## 2018-06-07 MED ORDER — BUPIVACAINE-EPINEPHRINE 0.25% -1:200000 IJ SOLN
INTRAMUSCULAR | Status: DC | PRN
  Administered 2018-06-07: 20 mL

## 2018-06-07 MED ORDER — BUPIVACAINE-EPINEPHRINE (PF) 0.25% -1:200000 IJ SOLN
INTRAMUSCULAR | Status: AC
  Filled 2018-06-07: qty 30

## 2018-06-07 MED ORDER — 0.9 % SODIUM CHLORIDE (POUR BTL) OPTIME
TOPICAL | Status: DC | PRN
  Administered 2018-06-07: 1000 mL

## 2018-06-07 MED ORDER — ALBUTEROL SULFATE (2.5 MG/3ML) 0.083% IN NEBU: 2.5000 mg | INHALATION_SOLUTION | Freq: Four times a day (QID) | RESPIRATORY_TRACT | Status: DC | PRN

## 2018-06-07 MED ORDER — TRAMADOL HCL 50 MG PO TABS: 50.0000 mg | ORAL_TABLET | Freq: Four times a day (QID) | ORAL | Status: DC | PRN

## 2018-06-07 MED ORDER — CEFAZOLIN SODIUM-DEXTROSE 2-4 GM/100ML-% IV SOLN
2.0000 g | INTRAVENOUS | Status: AC
  Administered 2018-06-07: 2 g via INTRAVENOUS
  Filled 2018-06-07: qty 100

## 2018-06-07 MED ORDER — SUCCINYLCHOLINE CHLORIDE 200 MG/10ML IV SOSY
PREFILLED_SYRINGE | INTRAVENOUS | Status: AC
  Filled 2018-06-07: qty 10

## 2018-06-07 MED ORDER — SCOPOLAMINE 1 MG/3DAYS TD PT72
MEDICATED_PATCH | TRANSDERMAL | Status: AC
  Filled 2018-06-07: qty 1

## 2018-06-07 MED ORDER — LIP MEDEX EX OINT
TOPICAL_OINTMENT | CUTANEOUS | Status: AC
  Filled 2018-06-07: qty 7

## 2018-06-07 MED ORDER — DEXAMETHASONE SODIUM PHOSPHATE 10 MG/ML IJ SOLN
INTRAMUSCULAR | Status: AC
  Filled 2018-06-07: qty 1

## 2018-06-07 MED ORDER — FENTANYL CITRATE (PF) 100 MCG/2ML IJ SOLN
INTRAMUSCULAR | Status: DC | PRN
  Administered 2018-06-07 (×4): 50 ug via INTRAVENOUS

## 2018-06-07 MED ORDER — ONDANSETRON HCL 4 MG/2ML IJ SOLN
INTRAMUSCULAR | Status: AC
  Filled 2018-06-07: qty 2

## 2018-06-07 MED ORDER — SCOPOLAMINE 1 MG/3DAYS TD PT72
MEDICATED_PATCH | TRANSDERMAL | Status: DC | PRN
  Administered 2018-06-07: 1 via TRANSDERMAL

## 2018-06-07 MED ORDER — LACTATED RINGERS IV SOLN
INTRAVENOUS | Status: DC
  Administered 2018-06-07 (×2): via INTRAVENOUS

## 2018-06-07 MED ORDER — SUGAMMADEX SODIUM 200 MG/2ML IV SOLN
INTRAVENOUS | Status: DC | PRN
  Administered 2018-06-07: 200 mg via INTRAVENOUS

## 2018-06-07 MED ORDER — SUGAMMADEX SODIUM 200 MG/2ML IV SOLN
INTRAVENOUS | Status: AC
  Filled 2018-06-07: qty 2

## 2018-06-07 MED ORDER — ONDANSETRON HCL 4 MG/2ML IJ SOLN: 4.0000 mg | Freq: Four times a day (QID) | INTRAMUSCULAR | Status: DC | PRN

## 2018-06-07 MED ORDER — TRAMADOL HCL 50 MG PO TABS: 50.0000 mg | ORAL_TABLET | Freq: Four times a day (QID) | ORAL | 0 refills | Status: DC | PRN

## 2018-06-07 MED ORDER — LIDOCAINE 2% (20 MG/ML) 5 ML SYRINGE
INTRAMUSCULAR | Status: DC | PRN
  Administered 2018-06-07: 100 mg via INTRAVENOUS

## 2018-06-07 MED ORDER — HYDROCODONE-ACETAMINOPHEN 5-325 MG PO TABS
1.0000 | ORAL_TABLET | ORAL | Status: DC | PRN
  Administered 2018-06-07 – 2018-06-08 (×3): 2 via ORAL
  Filled 2018-06-07 (×3): qty 2

## 2018-06-07 SURGICAL SUPPLY — 35 items
BINDER ABDOMINAL 12 ML 46-62 (SOFTGOODS) ×2 IMPLANT
CHLORAPREP W/TINT 26ML (MISCELLANEOUS) ×2 IMPLANT
COVER SURGICAL LIGHT HANDLE (MISCELLANEOUS) ×2 IMPLANT
COVER WAND RF STERILE (DRAPES) IMPLANT
DECANTER SPIKE VIAL GLASS SM (MISCELLANEOUS) ×2 IMPLANT
DERMABOND ADVANCED (GAUZE/BANDAGES/DRESSINGS) ×1
DERMABOND ADVANCED .7 DNX12 (GAUZE/BANDAGES/DRESSINGS) ×1 IMPLANT
DEVICE SECURE STRAP 25 ABSORB (INSTRUMENTS) ×4 IMPLANT
DEVICE TROCAR PUNCTURE CLOSURE (ENDOMECHANICALS) ×2 IMPLANT
DRAPE INCISE IOBAN 66X45 STRL (DRAPES) IMPLANT
DRAPE UTILITY XL STRL (DRAPES) ×2 IMPLANT
ELECT REM PT RETURN 15FT ADLT (MISCELLANEOUS) ×2 IMPLANT
GLOVE SURG ORTHO 8.0 STRL STRW (GLOVE) ×2 IMPLANT
GOWN STRL REUS W/TWL XL LVL3 (GOWN DISPOSABLE) ×4 IMPLANT
KIT BASIN OR (CUSTOM PROCEDURE TRAY) ×2 IMPLANT
MARKER SKIN DUAL TIP RULER LAB (MISCELLANEOUS) ×2 IMPLANT
MESH VENTRALIGHT ST 4X6IN (Mesh General) ×2 IMPLANT
NEEDLE SPNL 22GX3.5 QUINCKE BK (NEEDLE) ×2 IMPLANT
PAD POSITIONING PINK XL (MISCELLANEOUS) IMPLANT
PROTECTOR NERVE ULNAR (MISCELLANEOUS) IMPLANT
SCISSORS LAP 5X35 DISP (ENDOMECHANICALS) IMPLANT
SET IRRIG TUBING LAPAROSCOPIC (IRRIGATION / IRRIGATOR) IMPLANT
SHEARS HARMONIC ACE PLUS 36CM (ENDOMECHANICALS) IMPLANT
SOLUTION ANTI FOG 6CC (MISCELLANEOUS) ×2 IMPLANT
STRIP CLOSURE SKIN 1/2X4 (GAUZE/BANDAGES/DRESSINGS) IMPLANT
SUT NOVA NAB DX-16 0-1 5-0 T12 (SUTURE) ×2 IMPLANT
SUT NOVA NAB GS-21 1 T12 (SUTURE) ×2 IMPLANT
TACKER 5MM HERNIA 3.5CML NAB (ENDOMECHANICALS) IMPLANT
TAPE CLOTH 4X10 WHT NS (GAUZE/BANDAGES/DRESSINGS) IMPLANT
TOWEL OR 17X26 10 PK STRL BLUE (TOWEL DISPOSABLE) ×2 IMPLANT
TOWEL OR NON WOVEN STRL DISP B (DISPOSABLE) ×2 IMPLANT
TRAY FOLEY MTR SLVR 16FR STAT (SET/KITS/TRAYS/PACK) ×2 IMPLANT
TRAY LAPAROSCOPIC (CUSTOM PROCEDURE TRAY) ×2 IMPLANT
TROCAR BLADELESS OPT 5 100 (ENDOMECHANICALS) ×2 IMPLANT
TUBING INSUF HEATED (TUBING) ×2 IMPLANT

## 2018-06-07 NOTE — Transfer of Care (Signed)
Immediate Anesthesia Transfer of Care Note  Patient: Morgan Fleming  Procedure(s) Performed: LAPAROSCOPIC VENTRAL INCISIONAL HERNIA REPAIR WITH MESH (N/A Abdomen) INSERTION OF MESH (N/A Abdomen)  Patient Location: PACU  Anesthesia Type:General  Level of Consciousness: sedated  Airway & Oxygen Therapy: Patient Spontanous Breathing and Patient connected to face mask oxygen  Post-op Assessment: Report given to RN and Post -op Vital signs reviewed and stable  Post vital signs: Reviewed and stable  Last Vitals:  Vitals Value Taken Time  BP    Temp    Pulse    Resp    SpO2      Last Pain:  Vitals:   06/07/18 0626  TempSrc:   PainSc: 0-No pain         Complications: No apparent anesthesia complications

## 2018-06-07 NOTE — Anesthesia Procedure Notes (Signed)
Procedure Name: Intubation Date/Time: 06/07/2018 7:30 AM Performed by: Lind Covert, CRNA Pre-anesthesia Checklist: Patient identified, Emergency Drugs available, Patient being monitored, Suction available and Timeout performed Patient Re-evaluated:Patient Re-evaluated prior to induction Oxygen Delivery Method: Circle system utilized Preoxygenation: Pre-oxygenation with 100% oxygen Induction Type: IV induction Ventilation: Mask ventilation without difficulty Laryngoscope Size: Mac and 4 Grade View: Grade I Tube type: Oral Tube size: 7.0 mm Number of attempts: 1 Airway Equipment and Method: Stylet Placement Confirmation: ETT inserted through vocal cords under direct vision,  positive ETCO2 and breath sounds checked- equal and bilateral Secured at: 21 cm Tube secured with: Tape Dental Injury: Teeth and Oropharynx as per pre-operative assessment

## 2018-06-07 NOTE — Interval H&P Note (Signed)
History and Physical Interval Note:  06/07/2018 7:11 AM  Morgan Fleming  has presented today for surgery, with the diagnosis of Ventral incisional hernia.  The various methods of treatment have been discussed with the patient and family. After consideration of risks, benefits and other options for treatment, the patient has consented to    Procedure(s): [place] (N/A) INSERTION OF MESH (N/A) as a surgical intervention .    The patient's history has been reviewed, patient examined, no change in status, stable for surgery.  I have reviewed the patient's chart and labs.  Questions were answered to the patient's satisfaction.    Armandina Gemma, [place] [facility] Office: [place]

## 2018-06-07 NOTE — Anesthesia Postprocedure Evaluation (Signed)
Anesthesia Post Note  Patient: Morgan Fleming  Procedure(s) Performed: LAPAROSCOPIC VENTRAL INCISIONAL HERNIA REPAIR WITH MESH (N/A Abdomen) INSERTION OF MESH (N/A Abdomen)     Patient location during evaluation: PACU Anesthesia Type: General Level of consciousness: awake and alert Pain management: pain level controlled Vital Signs Assessment: post-procedure vital signs reviewed and stable Respiratory status: spontaneous breathing, nonlabored ventilation, respiratory function stable and patient connected to nasal cannula oxygen Cardiovascular status: blood pressure returned to baseline and stable Postop Assessment: no apparent nausea or vomiting Anesthetic complications: no    Last Vitals:  Vitals:   06/07/18 0926 06/07/18 0930  BP: (!) 143/69 129/80  Pulse: 69 69  Resp: 15 12  Temp: 36.6 C   SpO2: 100% 100%    Last Pain:  Vitals:   06/07/18 0926  TempSrc:   PainSc: 6                  Monteen Toops S

## 2018-06-07 NOTE — Op Note (Signed)
Operative Note  Pre-operative Diagnosis:  Ventral incisional hernia  Post-operative Diagnosis:  same  Surgeon:  Armandina Gemma, MD  Assistant:  none   Procedure:  Laparoscopic ventral incisional hernia repair with mesh (Ventralight ST 15 x 10 cm)  Anesthesia:  general  Estimated Blood Loss:  50 cc  Drains: none         Specimen: none  Indications:  Patient returns to our practice for evaluation and management of incisional hernia. Patient had originally undergone an umbilical hernia repair with mesh patch by my partner, Dr. Judeth Horn. This was performed in 2016. Patient is known to my practice with a history of thyroid carcinoma having undergone thyroidectomy. Patient has recently given birth to her third child. She had noted a small recurrence of her hernia at the umbilicus after the birth of her second child. It is now continued to enlarge and is becoming symptomatic. Patient wishes to be evaluated for repair. Patient remains physically very active. She works out at [employer] several days a week. Her only other abdominal surgery was cesarean section. Hernia repair performed by Dr. Hulen Skains utilized a mesh patch.  Procedure Details:  The patient was seen in the pre-op holding area. The risks, benefits, complications, treatment options, and expected outcomes were previously discussed with the patient. The patient agreed with the proposed plan and has signed the informed consent form.  The patient was brought to the operating room by the surgical team, identified as Morgan Fleming and the procedure verified. A "time out" was completed and the above information confirmed.  Patient is brought to the operating room and placed in a supine position on the operating room table.  Following preparation, the patient is positioned and then prepped and draped in the usual aseptic fashion.  After ascertaining that an adequate level of anesthesia been achieved, an incision is made at the left  costal margin.  Using a 5 mm Optiview trocar the laparoscope is advanced through the abdominal wall and into the peritoneal cavity under direct vision.  Pneumoperitoneum is established.  Examination of the abdomen shows adhesions of the omentum to the previous surgical site at the level of the umbilicus and to the midline of the abdominal wall.  Additional operative ports were placed in the right upper quadrant, right lower quadrant, and left lower quadrant under direct vision.  Using the EndoShears with electrocautery, the adhesions to the anterior abdominal wall and to the previously placed mesh patch are taken down and hemostasis achieved with the electrocautery.  2 fascial defects are identified.  The first is immediately adjacent to the previously placed mesh patch.  There is a second fascial defect in the midline located about 3 cm further cephalad representing a ventral hernia.  Measurements are taken in order to cover both defects with a 4 cm overlap circumferentially.  A 15 cm x 10 cm mesh patch is selected for use.  Mesh was prepared on the back table.  7 equally spaced #1 Novafil sutures are placed around the periphery of the mesh.  The mesh was marked for orientation purposes.  The mesh was moistened with saline, rolled, and introduced into the peritoneal cavity through the left lower quadrant port.  Mesh was deployed and oriented properly.  Incisions are made on the abdominal wall with a #11 blade.  Using the Endo Catch, the #1 Novafil sutures are retrieved and brought through the abdominal wall at all 7 sites around the periphery of the mesh.  Sutures are then  pulled taut and the mesh is elevated against the abdominal wall.  It is well deployed.  There is broad coverage of the fascial defects.  All 7 sutures are then tied securely and suture tags are excised.  Using the secure strap, the periphery of the mesh is secured to the abdominal wall with a single ring of staples.  A second concentric  ring of staples is placed located more medially on the mesh.  This provides for good approximation of the mesh to the anterior abdominal wall circumferentially.  There is no undue laxity to the mesh.  Abdomen is inspected for good hemostasis.  Pneumoperitoneum was released and the mesh is visualized as it comes into contact with the underlying omentum and viscera.  All ports are removed under direct vision.  Good hemostasis is noted at the port sites.  Port sites are anesthetized with local anesthetic.  Incisions are closed with interrupted 4-0 Monocryl subcuticular sutures.  Wounds are washed and dried and Dermabond is placed on all wounds as dressing.  An abdominal binder was placed.  Patient is awakened from anesthesia and brought to the recovery room.  The patient tolerated the procedure well.   Armandina Gemma, MD [facility] Office: [phone]

## 2018-06-07 NOTE — Discharge Instructions (Signed)
°  [facility] ° °LAPAROSCOPIC SURGERY:  POST-OP INSTRUCTIONS ° °Always review your discharge instruction sheet given to you by the facility where your surgery was performed. ° °A prescription for pain medication may be given to you upon discharge.  Take your pain medication as prescribed.  If narcotic pain medicine is not needed, then you may take acetaminophen (Tylenol) or ibuprofen (Advil) as needed. ° °Take your usually prescribed medications unless otherwise directed. ° °If you need a refill on your pain medication, please contact your pharmacy.  They will contact our office to request authorization. Prescriptions will not be filled after 5 P.M. or on weekends. ° °You should follow a light diet the first few days after arrival home, such as soup and crackers or toast.  Be sure to include plenty of fluids daily. ° °Most patients will experience some swelling and bruising in the area of the incisions.  Ice packs will help.  Swelling and bruising can take several days to resolve.  ° °It is common to experience some constipation after surgery.  Increasing fluid intake and taking a stool softener (such as Colace) will usually help or prevent this problem from occurring.  A mild laxative (Milk of Magnesia or Miralax) should be taken according to package instructions if there has been no bowel movement after 48 hours. ° °You will have steri-strips and a gauze dressing over your incisions.  You may remove the gauze bandage on the second day after surgery, and you may shower at that time.  Leave your steri-strips (small skin tapes) in place directly over the incision.  These strips should remain on the skin for 5-7 days and then be removed.  You may get them wet in the shower and pat them dry. ° °Any sutures or staples will be removed at the office during your follow-up visit. ° °ACTIVITIES:  You may resume regular (light) daily activities beginning the next day - such as daily self-care, walking,  climbing stairs - gradually increasing activities as tolerated.  You may have sexual intercourse when it is comfortable.  Refrain from any heavy lifting or straining until approved by your doctor. ° °You may drive when you are no longer taking prescription pain medication, you can comfortably wear a seatbelt, and you can safely maneuver your car and apply brakes. ° °You should see your doctor in the office for a follow-up appointment approximately 2-3 weeks after your surgery.  Make sure that you call for this appointment within a day or two after you arrive home to insure a convenient appointment time. ° °WHEN TO CALL YOUR DOCTOR: °1. Fever over 101.0 °2. Inability to urinate °3. Continued bleeding from incision °4. Increased pain, redness, or drainage from the incision °5. Increasing abdominal pain ° °The clinic staff is available to answer your questions during regular business hours.  Please don’t hesitate to call and ask to speak to one of the nurses for clinical concerns.  If you have a medical emergency, go to the nearest emergency room or call 911.  A surgeon from [facility] is always on call for the hospital. ° °Kiev Labrosse M. Alexandrina Fiorini, MD, FACS °[facility] °Office: [phone] °Toll Free:  [phone] °FAX [phone] ° °Website: [email or web address] °

## 2018-06-08 DIAGNOSIS — K432 Incisional hernia without obstruction or gangrene: Secondary | ICD-10-CM | POA: Diagnosis not present

## 2018-06-08 MED ORDER — LEVOTHYROXINE SODIUM 25 MCG PO TABS
137.0000 ug | ORAL_TABLET | Freq: Every day | ORAL | Status: DC
  Administered 2018-06-08: 137 ug via ORAL
  Filled 2018-06-08: qty 1

## 2018-06-08 MED ORDER — HYDROCODONE-ACETAMINOPHEN 5-325 MG PO TABS: 1.0000 | ORAL_TABLET | Freq: Four times a day (QID) | ORAL | 0 refills | Status: DC | PRN

## 2018-06-08 NOTE — Discharge Summary (Signed)
Physician Discharge Summary  Patient ID: Morgan Fleming MRN: [id number] DOB/AGE: 1980-12-18 37 y.o.  Admit date: 06/07/2018 Discharge date: 06/08/2018  Admission Diagnoses: incisional hernia  Discharge Diagnoses:  Principal Problem:   Ventral incisional hernia   Discharged Condition: good  Hospital Course: Pt admitted for overnight observation.  She was discharged to home once tolerating a diet and PO pain meds.  Consults: None  Significant Diagnostic Studies: none  Treatments: IV hydration and surgery: ventral hernia repair  Discharge Exam: Blood pressure 112/80, pulse (!) 58, temperature 99 F (37.2 C), temperature source Oral, resp. rate 16, height 5\' 3"  (1.6 m), weight 66.7 kg, last menstrual period 05/30/2018, SpO2 97 %, currently breastfeeding. General appearance: alert and cooperative GI: normal findings: soft, appropriately tender Incision/Wound: clean, dry, intact  Disposition: home   Allergies as of 06/08/2018      Reactions   Mometasone Furoate Swelling   Only lips and nose.   Montelukast Sodium Nausea And Vomiting      Medication List    STOP taking these medications   ibuprofen 600 MG tablet Commonly known as:  ADVIL,MOTRIN     TAKE these medications   Albuterol Sulfate 108 (90 Base) MCG/ACT Aepb Inhale 2 puffs into the lungs every 6 (six) hours as needed (shortness of breath).   ferrous sulfate 325 (65 FE) MG EC tablet Take 1 tablet (325 mg total) by mouth 2 (two) times daily.   Fluticasone-Salmeterol 100-50 MCG/DOSE Aepb Commonly known as:  ADVAIR Inhale 1 puff into the lungs 2 (two) times daily.   HYDROcodone-acetaminophen 5-325 MG tablet Commonly known as:  NORCO/VICODIN Take 1-2 tablets by mouth every 6 (six) hours as needed for moderate pain.   SYNTHROID 137 MCG tablet Generic drug:  levothyroxine Take 1 tablet (137 mcg total) by mouth daily before breakfast.   traMADol 50 MG tablet Commonly known as:  ULTRAM Take 1-2  tablets (50-100 mg total) by mouth every 6 (six) hours as needed for moderate pain or severe pain.      Follow-up Information    Armandina Gemma, MD. Schedule an appointment as soon as possible for a visit in 3 weeks.   Specialty:  General Surgery Why:  For wound re-check Contact information: [address] [place] [address] [phone]           Signed: Rosario Adie 41/63/8453, 9:07 AM

## 2018-06-10 ENCOUNTER — Encounter (HOSPITAL_COMMUNITY): Payer: Self-pay | Admitting: Surgery

## 2018-08-20 IMAGING — CT CT HEAD W/O CM
3 series · 15 of 47 positions shown, 18 images · non-contrast
Comparison: 03/11/2016 MRI

CLINICAL DATA: Personal history of Chiari malformation and
headache. Preoperative exam.

EXAM:
CT HEAD WITHOUT CONTRAST
TECHNIQUE: Contiguous axial images were obtained from the base of the skull
through the vertex without intravenous contrast.

[Series 2: head wo · axial · 0.42mm/px · z∈[-134,-4]mm · 9 of 32 slices shown, 12 images]
[im 3/32  brain]
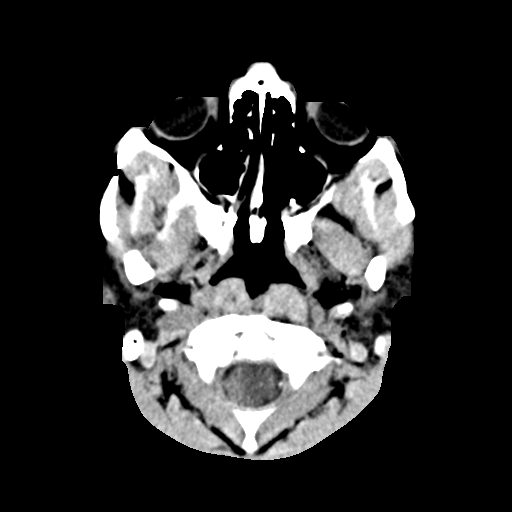
[im 3/32  bone]
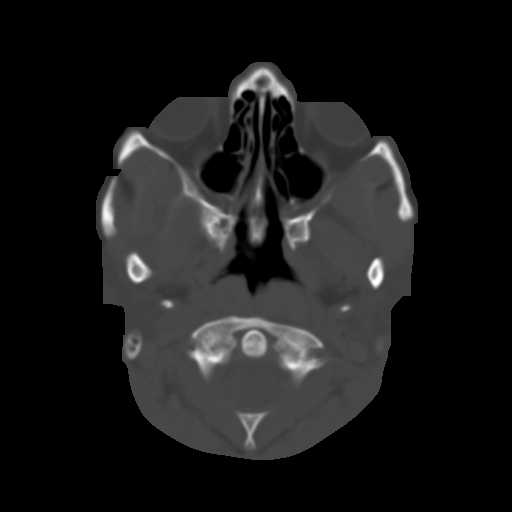
[im 6/32  brain]
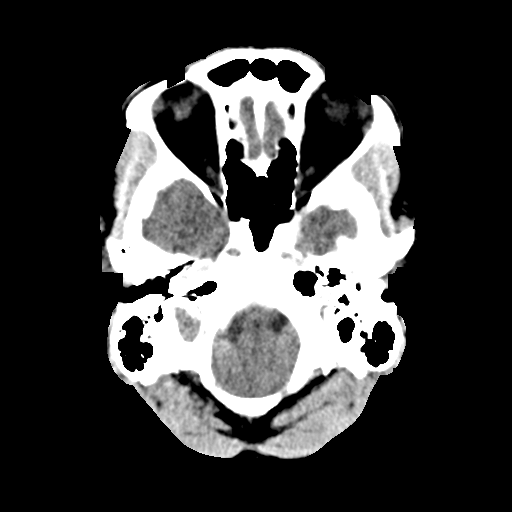
[im 9/32  brain]
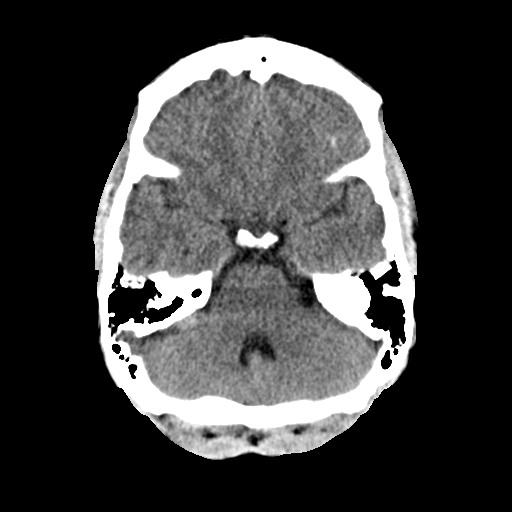
[im 12/32  brain]
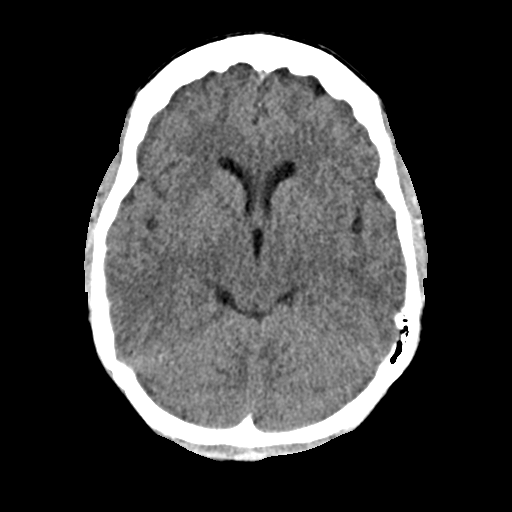
[im 17/32  brain]
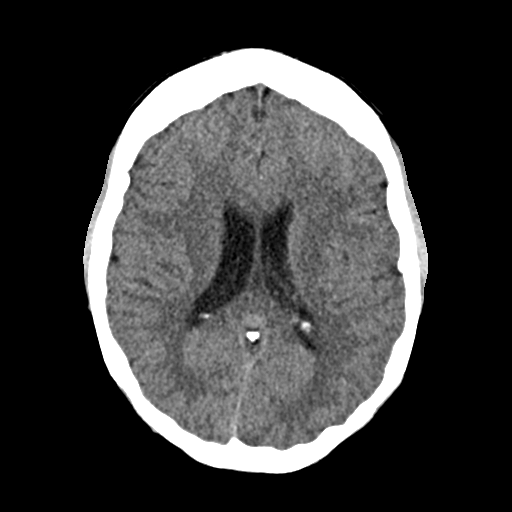
[im 17/32  bone]
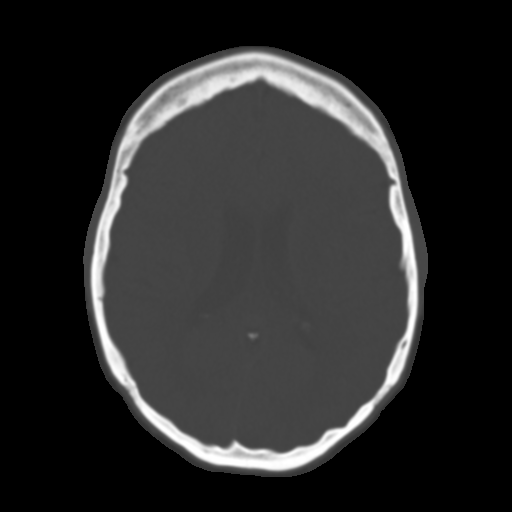
[im 20/32  brain]
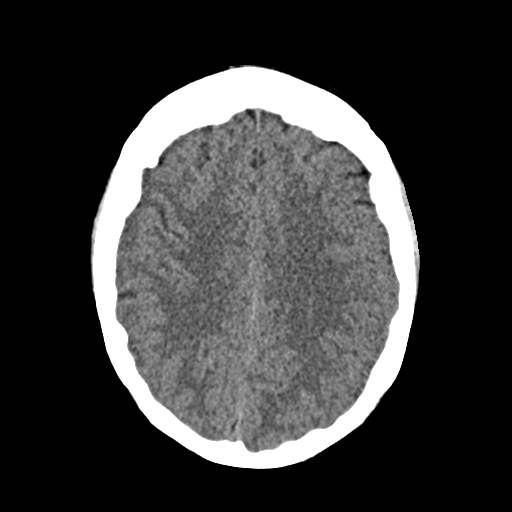
[im 23/32  brain]
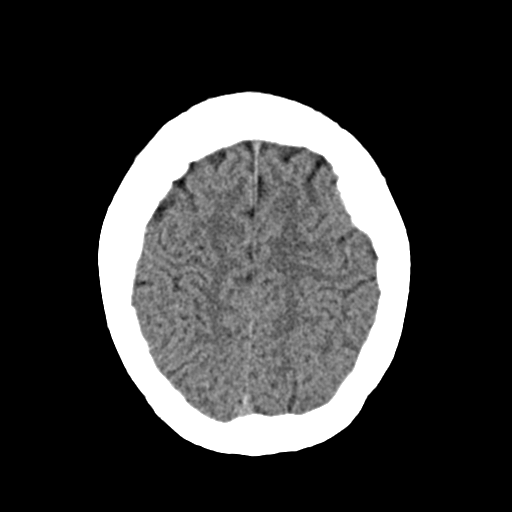
[im 26/32  brain]
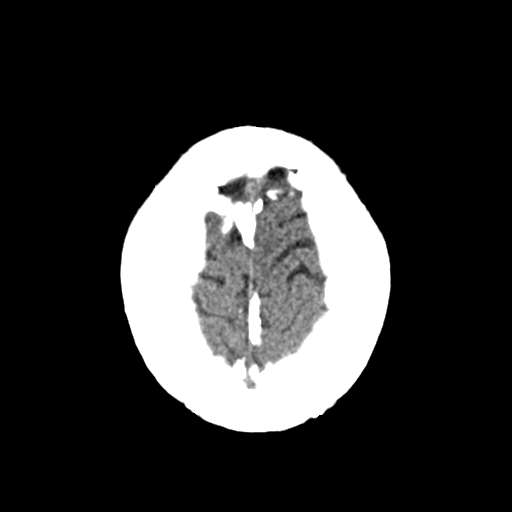
[im 29/32  brain]
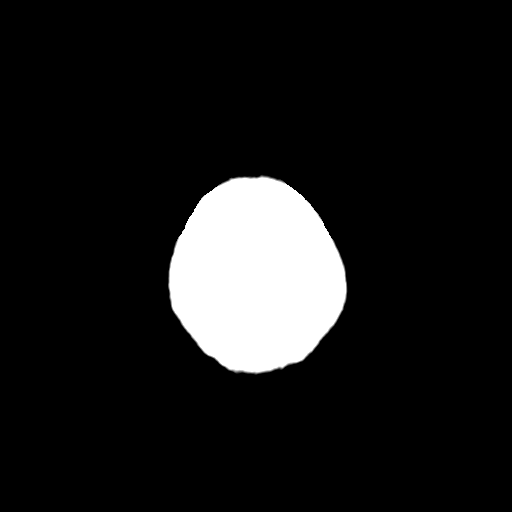
[im 29/32  bone]
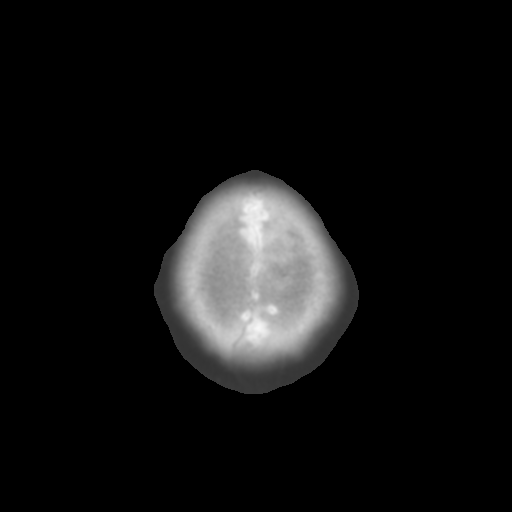

[Series 4: coronal soft · coronal · 0.31mm/px · 3 of 61 slices shown]
[im 21/61  brain]
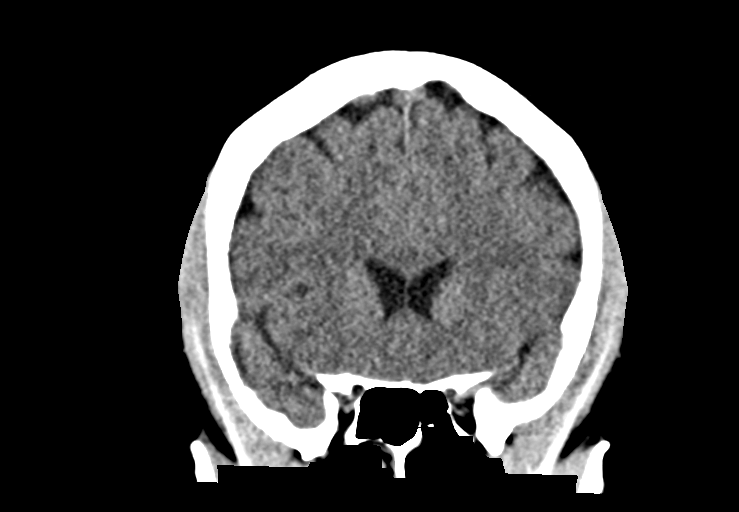
[im 27/61  brain]
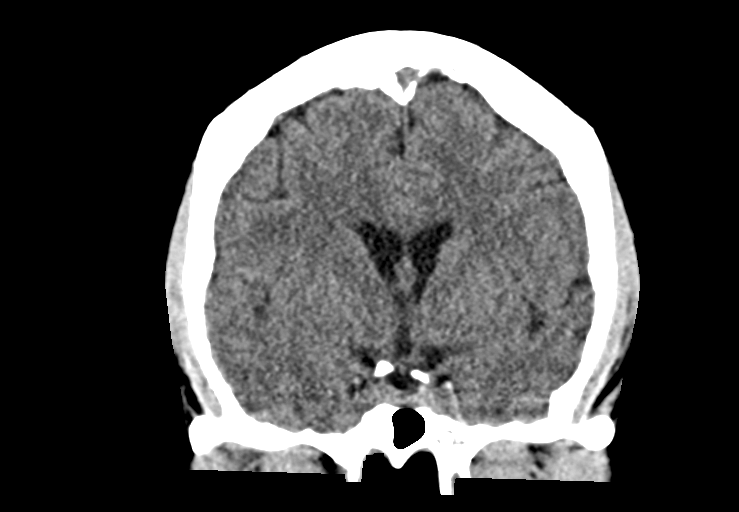
[im 34/61  brain]
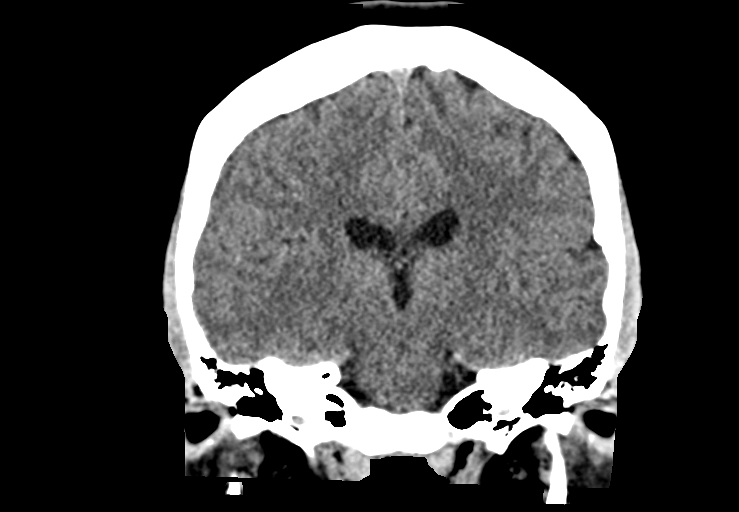

[Series 5: sag soft · sagittal · 0.32mm/px · 3 of 54 slices shown]
[im 18/54  brain]
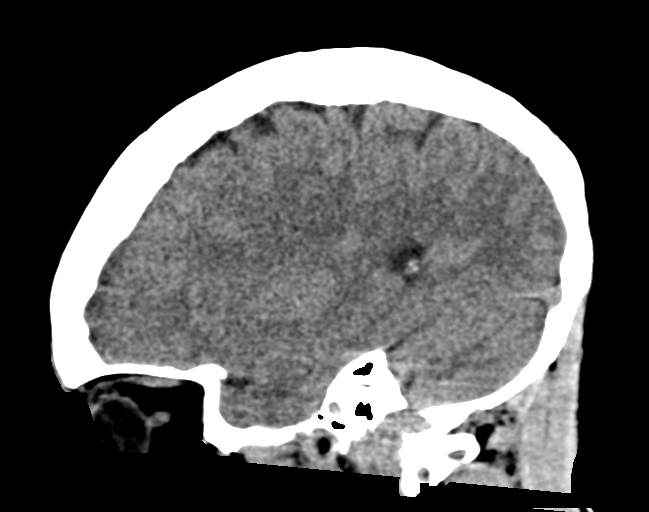
[im 27/54  brain]
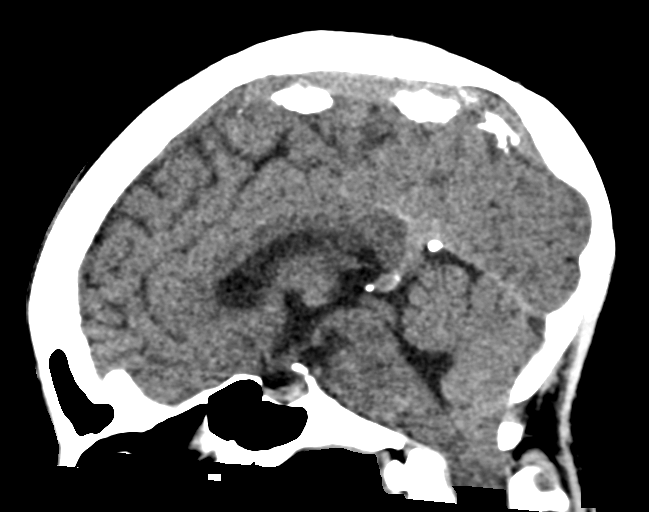
[im 36/54  brain]
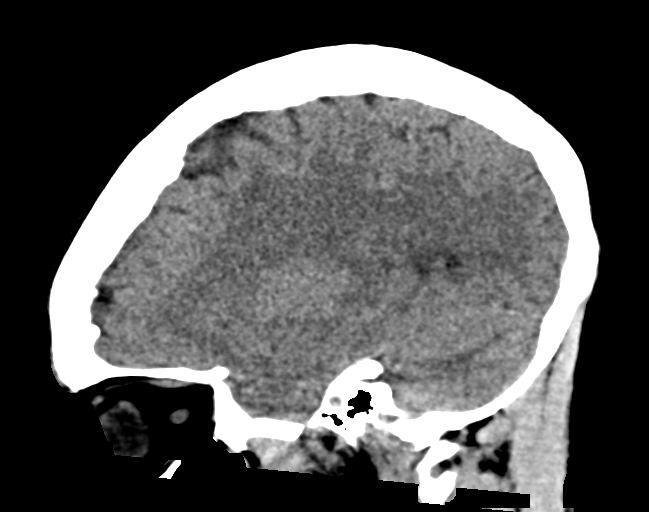

[15 of 47 positions shown; findings below may reference images not displayed]

FINDINGS: Brain: Chiari malformation as previously demonstrated with
cerebellar tonsils extending through the foramen magnum to the mid
C2 level. The brain is otherwise normal without evidence of
hydrocephalus, mass lesion, infarction, hemorrhage or extra-axial
collection.

Vascular: No significant vascular finding.

Skull: Normal

Sinuses/Orbits: Clear/normal

Other: None significant
IMPRESSION: Cerebellar tonsillar extension through the foramen magnum to the mid
C2 level as shown at the previous MRI. No other intracranial
finding.

## 2018-11-26 ENCOUNTER — Other Ambulatory Visit: Payer: Self-pay

## 2018-11-28 ENCOUNTER — Ambulatory Visit: Payer: BC Managed Care – PPO | Admitting: Internal Medicine

## 2018-11-28 ENCOUNTER — Encounter: Payer: Self-pay | Admitting: Internal Medicine

## 2018-11-28 ENCOUNTER — Other Ambulatory Visit: Payer: Self-pay

## 2018-11-28 VITALS — BP 110/60 | HR 74 | Ht 63.5 in | Wt 147.0 lb

## 2018-11-28 DIAGNOSIS — C73 Malignant neoplasm of thyroid gland: Secondary | ICD-10-CM | POA: Diagnosis not present

## 2018-11-28 DIAGNOSIS — E890 Postprocedural hypothyroidism: Secondary | ICD-10-CM | POA: Diagnosis not present

## 2018-11-28 LAB — TSH: TSH: 1.33 u[IU]/mL (ref 0.35–4.50)

## 2018-11-28 LAB — T4, FREE: Free T4: 0.97 ng/dL (ref 0.60–1.60)

## 2018-11-28 NOTE — Patient Instructions (Signed)
Please stop at the lab.  Continue Synthroid 137 mcg daily.  Take the thyroid hormone every day, with water, at least 30 minutes before breakfast, separated by at least 4 hours from: - acid reflux medications - calcium - iron - multivitamins  Please come back for a follow-up appointment 1 year.

## 2018-11-28 NOTE — Progress Notes (Signed)
Patient ID: Morgan Fleming, female   DOB: 06/22/1980, 38 y.o.   MRN: [id number]   HPI  Morgan Fleming is a 38 y.o. female, initially referred by her ObGyn, Dr. Ena Dawley, returning for f/u for h/o FV of PTC and postsurgical hypothyroidism. Last visit 1 year ago.  She had ventral hernia Sx in 05/2018.   Reviewed her thyroid cancer history: Thyroid cancer history: At the beginning of 06/2014 she noticed a thyroid nodule in the mirror and saw her OB/GYN doctor who ordered a thyroid ultrasound.  - 07/14/2014: Thyroid U/S:  Right thyroid lobe : 56 x 26 x 35 mm. Dominant 32 x 22 x 35 mm nodule, inferior pole. 4 x 3 x 5 mm hypoechoic nodule, superior pole.  Left thyroid lobe: 45 x 14 x 13 mm. Three small cysts, all less than 5 mm maximum diameter.  Isthmus Thickness: 5 mm. No nodules visualized.  Lymphadenopathy: None visualized.  - 08/12/2014: FNA of the right thyroid nodule Adequacy Reason Satisfactory For Evaluation. Diagnosis THYROID, RIGHT, FINE NEEDLE ASPIRATION (SPECIMEN 1 OF 1, COLLECTED ON 08/12/2014): ATYPIA OF UNDETERMINED SIGNIFICANCE / FOLLICULAR LESION OF UNDETERMINED SIGNIFICANCE (BETHESDA CATEGORY III). SEE COMMENT. Willeen Niece MD Pathologist, Electronic Signature (Case signed 08/13/2014) Specimen Clinical Information Dominant 32 x 22 x 64m nodule, inferior pole right lobe thyroid Source Thyroid, Fine Needle Aspiration, Right, (Specimen 1 of 1, collected on 08/12/2014)  - 09/10/2014: R thyroidectomy >> follicular variant of papillary thyroid cancer Thyroid, lobectomy, right - FOLLICULAR VARIANT OF PAPILLARY THYROID CARCINOMA, 3.0 CM, LIMITED TO THYROID PARENCHYMA. - RESECTION MARGIN, NEGATIVE FOR ATYPIA OR MALIGNANCY. PLEASE SEE ONCOLOGY TEMPLATE FOR DETAILS. - TWO PARATHYROID GLANDS, NO EVIDENCE OF NEOPLASM. Specimen: Right thyroid gland. Procedure: Right hemi-thyroidectomy. Specimen Integrity (intact/fragmented): Intact. Tumor focality:  Unifocal. Dominant tumor: Maximum tumor size (cm): 3.0 cm. Tumor laterality: Right thyroid. Histologic type (including subtype and/or unique features as applicable): Follicular variant of papillary thyroid carcinoma. Tumor capsule: Not identified. Extrathyroidal extension: No. Margins: Negative. Lymph - Vascular invasion: Not identified. Capsular invasion with degree of invasion if present: N/A. Lymph nodes: # examined N/A; # positive; N/A. TNM code: pT2, pNX  - 09/25/2014: completion thyroidectomy - As she was breast-feeding, we delayed RAI tx (she had 2 small boys at the time), but had this with Tg 09/16/2015 >> WBS 09/24/2015 >> no metastases.  Thyroglobulin levels have been low: Lab Results  Component Value Date   THYROGLB 0.1 (L) 11/26/2017   THYROGLB 0.1 (L) 12/26/2016   THYROGLB 0.1 (L) 12/16/2015   THGAB <1 11/26/2017   THGAB <1 12/26/2016   THGAB <1 12/16/2015   Reviewed patient's TFTs: Lab Results  Component Value Date   TSH 0.92 02/22/2018   TSH 0.11 (L) 01/09/2018   TSH 0.05 (L) 11/26/2017   TSH 2.70 10/03/2017   TSH 2.30 08/24/2017   TSH 3.92 07/26/2017   TSH 6.53 (H) 06/28/2017   TSH 2.67 05/22/2017   TSH 0.87 03/23/2017   TSH 0.56 02/23/2017   TSH 0.32 (L) 12/26/2016   TSH 0.42 08/29/2016   TSH 0.32 (L) 06/16/2016   TSH 0.38 12/16/2015   TSH 0.64 05/25/2015   TSH 3.03 03/26/2015   TSH 1.98 02/19/2015   TSH 7.25 (H) 01/05/2015   TSH 15.60 (H) 11/23/2014   FREET4 0.94 02/22/2018   FREET4 1.27 01/09/2018   FREET4 1.51 11/26/2017   FREET4 0.82 10/03/2017   FREET4 0.75 08/24/2017   FREET4 0.92 07/26/2017   FREET4 0.82 06/28/2017   FREET4 0.83 05/22/2017  FREET4 0.92 03/23/2017   FREET4 1.15 02/23/2017   FREET4 1.07 12/26/2016   FREET4 0.96 08/29/2016   FREET4 1.38 06/16/2016   FREET4 1.28 12/16/2015   FREET4 1.17 05/25/2015   FREET4 1.17 03/26/2015   FREET4 1.17 02/19/2015   FREET4 1.06 01/05/2015   FREET4 0.70 11/23/2014   07/28/2014:  TSH 0.916   During her previous pregnancy, she was on a high dose of Synthroid, after which we could decrease the dose initially to 150 mcg daily at last visit, then to 137 mcg daily in 12/2017.  She takes this: - in am - fasting - at least 1h from b'fast - no Ca, Fe, MVI, PPIs - not on Biotin  Pt denies: - feeling nodules in neck - hoarseness - dysphagia - choking - SOB with lying down  No FH of thyroid cancer. No h/o radiation tx to head or neck except RAI tx.  No herbal supplements. No Biotin use. No recent steroids use.   She has a history of asthma.  In 11/02/2017 she gave birth to a healthy baby boy.  ROS: Constitutional: no weight gain/no weight loss, no fatigue, no subjective hyperthermia, no subjective hypothermia Eyes: no blurry vision, no xerophthalmia ENT: no sore throat, + see HPI Cardiovascular: no CP/no SOB/no palpitations/no leg swelling Respiratory: no cough/no SOB/no wheezing Gastrointestinal: no N/no V/no D/no C/no acid reflux Musculoskeletal: no muscle aches/no joint aches Skin: no rashes, no hair loss Neurological: no tremors/no numbness/no tingling/no dizziness  I reviewed pt's medications, allergies, PMH, social hx, family hx, and changes were documented in the history of present illness. Otherwise, unchanged from my initial visit note.  Past Medical History:  Diagnosis Date  . Anemia   . 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 Asthma    daily and prn inhaler  . Breast feeding status of mother    instructed to pump and discard x 24 hrs. post-op  . Cancer ([place])    thyroid  . Fibroid   . Headache 04/01/2015   Traction headache  . Hypothyroidism   . PONV (postoperative nausea and vomiting)    vomiting  . Thyroid neoplasm   . Umbilical hernia    Past Surgical History:  Procedure Laterality Date  . CESAREAN SECTION N/A 06/29/2014   Procedure: CESAREAN SECTION;  Surgeon: Crawford Givens, MD;  Location: [facility];  Service: Obstetrics;   Laterality: N/A;  . DILATION AND CURETTAGE OF UTERUS  03/19/2009   suction D & C, polypectomy  . HERNIA REPAIR    . SKIN TAG REMOVAL  1996   right ear  . SUBOCCIPITAL CRANIECTOMY CERVICAL LAMINECTOMY N/A 05/22/2016   Procedure: CHIARI DECOMPRESSION CERVICAL ONE-TWO DECOMPRESSION LAMINECTOMY;  Surgeon: Kary Kos, MD;  Location: [place];  Service: Neurosurgery;  Laterality: N/A;  . THYROID LOBECTOMY Right 09/10/2014   Procedure: RIGHT THYROID LOBECTOMY;  Surgeon: Armandina Gemma, MD;  Location: [facility];  Service: General;  Laterality: Right;  . THYROIDECTOMY N/A 09/25/2014   Procedure: COMPLETION THYROIDECTOMY ;  Surgeon: Armandina Gemma, MD;  Location: [facility];  Service: General;  Laterality: N/A;  . UMBILICAL HERNIA REPAIR  05/29/2011   Procedure: HERNIA REPAIR UMBILICAL ADULT;  Surgeon: Belva Crome, MD;  Location: [place];  Service: General;  Laterality: N/A;  Umbilical Hernia repair with mesh patch  . VACUUM ASSISTED VAGINAL DELIVERY  10/06/2010   repair of second degree midline lac.  . WISDOM TOOTH EXTRACTION     History   Social History  . Marital Status:  Married    Spouse Name: N/A  . Number of Children: 2   Occupational History  .  school counselor    Social History Main Topics  . Smoking status: Never Smoker   . Smokeless tobacco: Never Used  . Alcohol Use: No  . Drug Use: No  . Sexual Activity: Yes   Current Outpatient Medications  Medication Sig Dispense Refill  . Albuterol Sulfate 108 (90 BASE) MCG/ACT AEPB Inhale 2 puffs into the lungs every 6 (six) hours as needed (shortness of breath).     . ferrous sulfate 325 (65 FE) MG EC tablet Take 1 tablet (325 mg total) by mouth 2 (two) times daily. (Patient not taking: Reported on 05/30/2018) 60 tablet 3  . Fluticasone-Salmeterol (ADVAIR) 100-50 MCG/DOSE AEPB Inhale 1 puff into the lungs 2 (two) times daily.     Marland Kitchen HYDROcodone-acetaminophen (NORCO/VICODIN) 5-325 MG tablet Take 1-2 tablets by mouth every 6 (six) hours  as needed for moderate pain. 30 tablet 0  . SYNTHROID 137 MCG tablet Take 1 tablet (137 mcg total) by mouth daily before breakfast. 90 tablet 1  . traMADol (ULTRAM) 50 MG tablet Take 1-2 tablets (50-100 mg total) by mouth every 6 (six) hours as needed for moderate pain or severe pain. 30 tablet 0   No current facility-administered medications for this visit.    No current facility-administered medications on file prior to visit.    Allergies  Allergen Reactions  . Mometasone Furoate Swelling    Only lips and nose.  . Montelukast Sodium Nausea And Vomiting   Family History  Problem Relation Age of Onset  . Hypertension Mother   . Bell's palsy Mother   . Thyroid disease Mother   . Hypertension Father   . Diabetes Father    PE: BP 110/60   Pulse 74   Ht 5' 3.5" (1.613 m)   Wt 147 lb (66.7 kg)   LMP 11/17/2018   SpO2 97%   BMI 25.63 kg/m  Body mass index is 25.63 kg/m.  Wt Readings from Last 3 Encounters:  11/28/18 147 lb (66.7 kg)  06/07/18 147 lb (66.7 kg)  06/03/18 147 lb (66.7 kg)   Constitutional: Normal weight, in NAD Eyes: PERRLA, EOMI, no exophthalmos ENT: moist mucous membranes, no neck masses palpated, no cervical lymphadenopathy Cardiovascular: RRR, No MRG Respiratory: CTA B Gastrointestinal: abdomen soft, NT, ND, BS+ Musculoskeletal: no deformities, strength intact in all 4 Skin: moist, warm, no rashes Neurological: no tremor with outstretched hands, DTR normal in all 4  ASSESSMENT: 1. Follicular PTC  2. Postsurgical hypothyroidism  PLAN: 1. Follicular variant of PTC -Patient has a history of total thyroidectomy for follicular variant of papillary thyroid cancer and had RAI treatment 08/2015.  Posttreatment whole-body scan was negative for metastases.  She is at low risk for complications from her thyroid cancer due to the fact that this was follicular variant of PTC which is the most benign of the PTC types and since this was stage I due to  age -However, her tumor was not encapsulated and was large (3 cm) and that is why I suggested RAI treatment, which we did with Thyrogen -As of now, she has no neck masses palpated or felt by vision, no neck compression symptoms -Latest thyroglobulin was low with undetectable ATA antibodies -We will repeat Tg + ATA now -I will see her back in a year  2. Postop hypothyroidism - latest thyroid labs reviewed with pt >> normal  - she continues on LT4  137 mcg daily - pt feels good on this dose. - we discussed about taking the thyroid hormone every day, with water, >30 minutes before breakfast, separated by >4 hours from acid reflux medications, calcium, iron, multivitamins. Pt. is taking it correctly. - will check thyroid tests today: TSH and fT4 - If labs are abnormal, she will need to return for repeat TFTs in 1.5 months  Needs refills.  Office Visit on 11/28/2018  Component Date Value Ref Range Status  . TSH 11/28/2018 1.33  0.35 - 4.50 uIU/mL Final  . Free T4 11/28/2018 0.97  0.60 - 1.60 ng/dL Final   Comment: Specimens from patients who are undergoing biotin therapy and /or ingesting biotin supplements may contain high levels of biotin.  The higher biotin concentration in these specimens interferes with this Free T4 assay.  Specimens that contain high levels  of biotin may cause false high results for this Free T4 assay.  Please interpret results in light of the total clinical presentation of the patient.    . Thyroglobulin Ab 11/28/2018 <1  < or = 1 IU/mL Final  . Thyroglobulin 11/28/2018 0.1* ng/mL Final   Comment:       Reference Range:       Intact Thyroid   2.8-40.9       Athyrotic        <0.1 .       Note: Abnormal flagging is based       on the reference interval for        patients with intact thyroid. . . This test was performed using the Beckman Coulter  chemiluminescent method. Values obtained from different assay methods cannot be used interchangeably. Thyroglobulin  levels, regardless of value, should not be interpreted as absolute evidence of the presence or absence of disease. .   . Comment 11/28/2018    Final   Comment: . Thyroglobulin antibodies (TGAB) interfere with thyroglobulin (TG) assays; therefore, TGAB assay should always be performed in conjunction with a TG assay. . . For additional information, please refer to  http://education.questdiagnostics.com/faq/FAQ202  (This link is being provided for informational/ educational purposes only.) .    Labs all at goal.  Philemon Kingdom, MD PhD [facility]

## 2018-11-29 LAB — THYROGLOBULIN LEVEL: Thyroglobulin: 0.1 ng/mL — ABNORMAL LOW

## 2018-11-29 LAB — THYROGLOBULIN ANTIBODY: Thyroglobulin Ab: <1 IU/mL (ref <=1)

## 2018-11-29 MED ORDER — SYNTHROID 137 MCG PO TABS: 137.0000 ug | ORAL_TABLET | Freq: Every day | ORAL | 3 refills | Status: DC

## 2019-12-01 ENCOUNTER — Other Ambulatory Visit: Payer: Self-pay

## 2019-12-01 ENCOUNTER — Encounter: Payer: Self-pay | Admitting: Internal Medicine

## 2019-12-01 ENCOUNTER — Ambulatory Visit: Payer: BC Managed Care – PPO | Admitting: Internal Medicine

## 2019-12-01 VITALS — BP 120/80 | HR 60 | Ht 63.5 in | Wt 155.0 lb

## 2019-12-01 DIAGNOSIS — C73 Malignant neoplasm of thyroid gland: Secondary | ICD-10-CM

## 2019-12-01 DIAGNOSIS — E890 Postprocedural hypothyroidism: Secondary | ICD-10-CM

## 2019-12-01 NOTE — Patient Instructions (Signed)
Please stop at the lab.  Continue Synthroid 137 mcg daily.  Take the thyroid hormone every day, with water, at least 30 minutes before breakfast, separated by at least 4 hours from: - acid reflux medications - calcium - iron - multivitamins  Please come back for a follow-up appointment 1 year.

## 2019-12-01 NOTE — Progress Notes (Addendum)
Patient ID: Morgan Fleming, female   DOB: August 23, 1980, 39 y.o.   MRN: [id number]  This visit occurred during the SARS-CoV-2 public health emergency.  Safety protocols were in place, including screening questions prior to the visit, additional usage of staff PPE, and extensive cleaning of exam room while observing appropriate contact time as indicated for disinfecting solutions.   HPI  Morgan Fleming is a 39 y.o. female, initially referred by her ObGyn, Dr. Ena Fleming, returning for f/u for h/o FV of PTC and postsurgical hypothyroidism. Last visit 1 year ago.  Reviewed her thyroid cancer history: Thyroid cancer history: At the beginning of 06/2014 she noticed a thyroid nodule in the mirror and saw her OB/GYN doctor who ordered a thyroid ultrasound.  - 07/14/2014: Thyroid U/S:  Right thyroid lobe : 56 x 26 x 35 mm. Dominant 32 x 22 x 35 mm nodule, inferior pole. 4 x 3 x 5 mm hypoechoic nodule, superior pole.  Left thyroid lobe: 45 x 14 x 13 mm. Three small cysts, all less than 5 mm maximum diameter.  Isthmus Thickness: 5 mm. No nodules visualized.  Lymphadenopathy: None visualized.  - 08/12/2014: FNA of the right thyroid nodule Adequacy Reason Satisfactory For Evaluation. Diagnosis THYROID, RIGHT, FINE NEEDLE ASPIRATION (SPECIMEN 1 OF 1, COLLECTED ON 08/12/2014): ATYPIA OF UNDETERMINED SIGNIFICANCE / FOLLICULAR LESION OF UNDETERMINED SIGNIFICANCE (BETHESDA CATEGORY III). SEE COMMENT. Willeen Niece MD Pathologist, Electronic Signature (Case signed 08/13/2014) Specimen Clinical Information Dominant 32 x 22 x 6m nodule, inferior pole right lobe thyroid Source Thyroid, Fine Needle Aspiration, Right, (Specimen 1 of 1, collected on 08/12/2014)  - 09/10/2014: R thyroidectomy >> follicular variant of papillary thyroid cancer Thyroid, lobectomy, right - FOLLICULAR VARIANT OF PAPILLARY THYROID CARCINOMA, 3.0 CM, LIMITED TO THYROID PARENCHYMA. - RESECTION MARGIN, NEGATIVE  FOR ATYPIA OR MALIGNANCY. PLEASE SEE ONCOLOGY TEMPLATE FOR DETAILS. - TWO PARATHYROID GLANDS, NO EVIDENCE OF NEOPLASM. Specimen: Right thyroid gland. Procedure: Right hemi-thyroidectomy. Specimen Integrity (intact/fragmented): Intact. Tumor focality: Unifocal. Dominant tumor: Maximum tumor size (cm): 3.0 cm. Tumor laterality: Right thyroid. Histologic type (including subtype and/or unique features as applicable): Follicular variant of papillary thyroid carcinoma. Tumor capsule: Not identified. Extrathyroidal extension: No. Margins: Negative. Lymph - Vascular invasion: Not identified. Capsular invasion with degree of invasion if present: N/A. Lymph nodes: # examined N/A; # positive; N/A. TNM code: pT2, pNX  - 09/25/2014: completion thyroidectomy - As she was breast-feeding, we delayed RAI tx (she had 2 small boys at the time), but had this with Tg 09/16/2015 >> WBS 09/24/2015 >> no metastases.  Thyroglobulin levels have been low: Lab Results  Component Value Date   THYROGLB 0.1 (L) 11/28/2018   THYROGLB 0.1 (L) 11/26/2017   THYROGLB 0.1 (L) 12/26/2016   THYROGLB 0.1 (L) 12/16/2015   THGAB <1 11/28/2018   THGAB <1 11/26/2017   THGAB <1 12/26/2016   THGAB <1 12/16/2015   Reviewed her TFTs: Lab Results  Component Value Date   TSH 1.33 11/28/2018   TSH 0.92 02/22/2018   TSH 0.11 (L) 01/09/2018   TSH 0.05 (L) 11/26/2017   TSH 2.70 10/03/2017   TSH 2.30 08/24/2017   TSH 3.92 07/26/2017   TSH 6.53 (H) 06/28/2017   TSH 2.67 05/22/2017   TSH 0.87 03/23/2017   TSH 0.56 02/23/2017   TSH 0.32 (L) 12/26/2016   TSH 0.42 08/29/2016   TSH 0.32 (L) 06/16/2016   TSH 0.38 12/16/2015   TSH 0.64 05/25/2015   TSH 3.03 03/26/2015   TSH 1.98 02/19/2015  TSH 7.25 (H) 01/05/2015   TSH 15.60 (H) 11/23/2014   FREET4 0.97 11/28/2018   FREET4 0.94 02/22/2018   FREET4 1.27 01/09/2018   FREET4 1.51 11/26/2017   FREET4 0.82 10/03/2017   FREET4 0.75 08/24/2017   FREET4 0.92 07/26/2017    FREET4 0.82 06/28/2017   FREET4 0.83 05/22/2017   FREET4 0.92 03/23/2017   FREET4 1.15 02/23/2017   FREET4 1.07 12/26/2016   FREET4 0.96 08/29/2016   FREET4 1.38 06/16/2016   FREET4 1.28 12/16/2015   FREET4 1.17 05/25/2015   FREET4 1.17 03/26/2015   FREET4 1.17 02/19/2015   FREET4 1.06 01/05/2015   FREET4 0.70 11/23/2014   07/28/2014: TSH 0.916   During her pregnancy, she was on the higher dose of Synthroid. She is currently on Synthroid 137 mcg daily. - in am - fasting - at least 30 min from b'fast - no Ca, Fe, MVI, PPIs - not on Biotin   Pt denies: - feeling nodules in neck - hoarseness - dysphagia - choking - SOB with lying down  No FH of thyroid cancer. No h/o radiation tx to head or neck.  No seaweed or kelp. No recent contrast studies. No herbal supplements. No Biotin use. No recent steroids use.   She has a history of asthma.  In 11/02/2017 she gave birth to a healthy baby boy.  ROS: Constitutional: +  weight gain/no weight loss, no fatigue, no subjective hyperthermia, no subjective hypothermia Eyes: no blurry vision, no xerophthalmia ENT: no sore throat, + see HPI Cardiovascular: no CP/no SOB/no palpitations/no leg swelling Respiratory: no cough/no SOB/no wheezing Gastrointestinal: no N/no V/no D/no C/no acid reflux Musculoskeletal: no muscle aches/no joint aches Skin: no rashes, no hair loss Neurological: no tremors/no numbness/no tingling/no dizziness  I reviewed pt's medications, allergies, PMH, social hx, family hx, and changes were documented in the history of present illness. Otherwise, unchanged from my initial visit note.  Past Medical History:  Diagnosis Date  . Anemia   . 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 Asthma    daily and prn inhaler  . Breast feeding status of mother    instructed to pump and discard x 24 hrs. post-op  . Cancer ([place])    thyroid  . Fibroid   . Headache 04/01/2015   Traction headache  . Hypothyroidism    . PONV (postoperative nausea and vomiting)    vomiting  . Thyroid neoplasm   . Umbilical hernia    Past Surgical History:  Procedure Laterality Date  . CESAREAN SECTION N/A 06/29/2014   Procedure: CESAREAN SECTION;  Surgeon: Crawford Givens, MD;  Location: [facility] [facility];  Service: Obstetrics;  Laterality: N/A;  . DILATION AND CURETTAGE OF UTERUS  03/19/2009   suction D & C, polypectomy  . HERNIA REPAIR    . SKIN TAG REMOVAL  1996   right ear  . SUBOCCIPITAL CRANIECTOMY CERVICAL LAMINECTOMY N/A 05/22/2016   Procedure: CHIARI DECOMPRESSION CERVICAL ONE-TWO DECOMPRESSION LAMINECTOMY;  Surgeon: Kary Kos, MD;  Location: [place];  Service: Neurosurgery;  Laterality: N/A;  . THYROID LOBECTOMY Right 09/10/2014   Procedure: RIGHT THYROID LOBECTOMY;  Surgeon: Armandina Gemma, MD;  Location[facility];  Service: General;  Laterality: Right;  . THYROIDECTOMY N/A 09/25/2014   Procedure: COMPLETION THYROIDECTOMY ;  Surgeon: Armandina Gemma, MD;  Location: [facility];  Service: General;  Laterality: N/A;  . UMBILICAL HERNIA REPAIR  05/29/2011   Procedure: HERNIA REPAIR UMBILICAL ADULT;  Surgeon: Belva Crome, MD;  Location: [place];  Service: General;  Laterality: N/A;  Umbilical Hernia repair with mesh patch  . VACUUM ASSISTED VAGINAL DELIVERY  10/06/2010   repair of second degree midline lac.  . WISDOM TOOTH EXTRACTION     History   Social History  . Marital Status: Married    Spouse Name: N/A  . Number of Children: 2   Occupational History  .  school counselor    Social History Main Topics  . Smoking status: Never Smoker   . Smokeless tobacco: Never Used  . Alcohol Use: No  . Drug Use: No  . Sexual Activity: Yes   Current Outpatient Medications  Medication Sig Dispense Refill  . Albuterol Sulfate 108 (90 BASE) MCG/ACT AEPB Inhale 2 puffs into the lungs every 6 (six) hours as needed (shortness of breath).     . Fluticasone-Salmeterol (ADVAIR) 100-50 MCG/DOSE AEPB Inhale 1 puff into the  lungs 2 (two) times daily.     Marland Kitchen SYNTHROID 137 MCG tablet Take 1 tablet (137 mcg total) by mouth daily before breakfast. 90 tablet 3   No current facility-administered medications for this visit.   No current facility-administered medications on file prior to visit.    Allergies  Allergen Reactions  . Mometasone Furoate Swelling    Only lips and nose.  . Montelukast Sodium Nausea And Vomiting   Family History  Problem Relation Age of Onset  . Hypertension Mother   . Bell's palsy Mother   . Thyroid disease Mother   . Hypertension Father   . Diabetes Father    PE: BP 120/80   Pulse 60   Ht 5' 3.5" (1.613 m)   Wt 155 lb (70.3 kg)   SpO2 99%   BMI 27.03 kg/m  Body mass index is 27.03 kg/m.  Wt Readings from Last 3 Encounters:  12/01/19 155 lb (70.3 kg)  11/28/18 147 lb (66.7 kg)  06/07/18 147 lb (66.7 kg)   Constitutional: Normal weight, in NAD Eyes: PERRLA, EOMI, no exophthalmos ENT: moist mucous membranes, no neck masses palpated, no cervical lymphadenopathy Cardiovascular: RRR, No MRG Respiratory: CTA B Gastrointestinal: abdomen soft, NT, ND, BS+ Musculoskeletal: no deformities, strength intact in all 4 Skin: moist, warm, no rashes Neurological: no tremor with outstretched hands, DTR normal in all 4  ASSESSMENT: 1. Follicular PTC  2. Postsurgical hypothyroidism  PLAN: 1. Follicular variant of PTC -Patient has a history of total thyroidectomy for follicular variant of papillary thyroid cancer and had RAI treatment 08/2015.  Posttreatment whole-body scan was negative for metastases.  She is at low risk for complications from her thyroid cancer due to the fact that this was follicular variant of PTC which is the most benign of the PTC types and since this was stage I due to age -However, her tumor was not encapsulated and was large (3 cm) >> I suggested RAI treatment, which we did with Thyrogen -As of now, she has no neck masses palpated and no neck compression  symptoms -Latest thyroglobulin was low but detectable and her ATA antibodies were undetectable at last visit -We will repeat the thyroglobulin and ATA now -At this visit, we will also check a thyroid ultrasound, now 4 years from her surgery.  If this is abnormal, she may need a whole-body scan. -I will see her back in a year  2. Postop hypothyroidism - latest thyroid labs reviewed with pt >> normal: Lab Results  Component Value Date   TSH 1.33 11/28/2018   - she continues on Synthroid d.a.w. 137 mcg  daily - pt feels good on this dose. - we discussed about taking the thyroid hormone every day, with water, >30 minutes before breakfast, separated by >4 hours from acid reflux medications, calcium, iron, multivitamins. Pt. is taking it correctly. - will check thyroid tests today: TSH and fT4 - If labs are abnormal, she will need to return for repeat TFTs in 1.5 months  Needs refills.  Component     Latest Ref Rng & Units 12/01/2019  Thyroglobulin     ng/mL 0.2 (L)  Comment        T4,Free(Direct)     0.60 - 1.60 ng/dL 0.91  TSH     0.35 - 4.50 uIU/mL 1.47  Thyroglobulin Ab     < or = 1 IU/mL <1  Normal TFTs.  Thyroglobulin very slightly higher.  Question variability in the assay.  Will await the new neck ultrasound.  Narrative & Impression  FINDINGS: Isthmus: Surgically absent. There is no residual nodular soft tissue within the isthmic resection bed.  Right lobe: Surgically absent. There is no residual nodular soft tissue within the right lobectomy resection bed.  Left lobe: Surgically absent. There is no residual nodular soft tissue within the left lobectomy resection bed.  _________________________________________________________  No regional cervical lymphadenopathy.  IMPRESSION: Post total thyroidectomy without evidence of residual or locally recurrent disease.  Electronically Signed   By: Sandi Mariscal M.D.   On: 12/11/2019 16:39  Philemon Kingdom, MD  PhD [facility]

## 2019-12-02 ENCOUNTER — Encounter: Payer: Self-pay | Admitting: Internal Medicine

## 2019-12-02 LAB — THYROGLOBULIN LEVEL: Thyroglobulin: 0.2 ng/mL — ABNORMAL LOW

## 2019-12-02 LAB — THYROGLOBULIN ANTIBODY: Thyroglobulin Ab: <1 IU/mL (ref <=1)

## 2019-12-02 LAB — TSH: TSH: 1.47 u[IU]/mL (ref 0.35–4.50)

## 2019-12-02 LAB — T4, FREE: Free T4: 0.91 ng/dL (ref 0.60–1.60)

## 2019-12-02 MED ORDER — SYNTHROID 137 MCG PO TABS: 137.0000 ug | ORAL_TABLET | Freq: Every day | ORAL | 3 refills | Status: DC

## 2019-12-11 ENCOUNTER — Ambulatory Visit
Admission: RE | Admit: 2019-12-11 | Discharge: 2019-12-11 | Disposition: A | Discharge status: Home / Self Care | Payer: BC Managed Care – PPO | Source: Ambulatory Visit | Attending: Internal Medicine | Admitting: Internal Medicine

## 2019-12-11 DIAGNOSIS — C73 Malignant neoplasm of thyroid gland: Secondary | ICD-10-CM

## 2019-12-12 ENCOUNTER — Other Ambulatory Visit: Payer: BC Managed Care – PPO

## 2020-11-26 ENCOUNTER — Other Ambulatory Visit: Payer: Self-pay

## 2020-11-26 ENCOUNTER — Encounter: Payer: Self-pay | Admitting: Internal Medicine

## 2020-11-26 ENCOUNTER — Ambulatory Visit (INDEPENDENT_AMBULATORY_CARE_PROVIDER_SITE_OTHER): Payer: BC Managed Care – PPO | Admitting: Internal Medicine

## 2020-11-26 VITALS — BP 122/88 | HR 69 | Ht 63.5 in | Wt 156.0 lb

## 2020-11-26 DIAGNOSIS — E890 Postprocedural hypothyroidism: Secondary | ICD-10-CM | POA: Diagnosis not present

## 2020-11-26 DIAGNOSIS — C73 Malignant neoplasm of thyroid gland: Secondary | ICD-10-CM

## 2020-11-26 LAB — TSH: TSH: 0.28 u[IU]/mL — ABNORMAL LOW (ref 0.35–4.50)

## 2020-11-26 LAB — T4, FREE: Free T4: 1.22 ng/dL (ref 0.60–1.60)

## 2020-11-26 NOTE — Patient Instructions (Signed)
Please stop at the lab.  Continue Synthroid 137 mcg daily.  Take the thyroid hormone every day, with water, at least 30 minutes before breakfast, separated by at least 4 hours from: - acid reflux medications - calcium - iron - multivitamins  Please come back for a follow-up appointment 1 year.

## 2020-11-26 NOTE — Progress Notes (Signed)
Patient ID: Morgan Fleming, female   DOB: 1980/12/16, 40 y.o.   MRN: [id number]  This visit occurred during the SARS-CoV-2 public health emergency.  Safety protocols were in place, including screening questions prior to the visit, additional usage of staff PPE, and extensive cleaning of exam room while observing appropriate contact time as indicated for disinfecting solutions.   HPI  Morgan Fleming is a 39 y.o. female, initially referred by her ObGyn, Dr. Ena Fleming, returning for f/u for h/o FV of PTC and postsurgical hypothyroidism. Last visit 1 year ago.  Interim history: She has allergies >> had steroid taper on 08/2020, them im steroid, then another taper 09/2020. She mentions weight gain but no other complaints.  Reviewed and addended her thyroid cancer history: Thyroid cancer history: At the beginning of 06/2014 she noticed a thyroid nodule in the mirror and saw her OB/GYN doctor who ordered a thyroid ultrasound.  - 07/14/2014: Thyroid U/S: Right thyroid lobe : 56 x 26 x 35 mm. Dominant 32 x 22 x 35 mm nodule, inferior pole. 4 x 3 x 5 mm hypoechoic nodule, superior pole.  Left thyroid lobe: 45 x 14 x 13 mm. Three small cysts, all less than 5 mm maximum diameter.  Isthmus Thickness: 5 mm.  No nodules visualized. Lymphadenopathy: None visualized.  - 08/12/2014: FNA of the right thyroid nodule Adequacy Reason Satisfactory For Evaluation. Diagnosis THYROID, RIGHT, FINE NEEDLE ASPIRATION (SPECIMEN 1 OF 1, COLLECTED ON 08/12/2014): ATYPIA OF UNDETERMINED SIGNIFICANCE / FOLLICULAR LESION OF UNDETERMINED SIGNIFICANCE (BETHESDA CATEGORY III). SEE COMMENT. 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 - RESECTION MARGIN, NEGATIVE FOR ATYPIA OR MALIGNANCY. PLEASE SEE ONCOLOGY TEMPLATE FOR DETAILS. - TWO PARATHYROID GLANDS, NO EVIDENCE OF NEOPLASM. Specimen: Right thyroid gland. Procedure: Right hemi-thyroidectomy. Specimen Integrity (intact/fragmented): Intact. Tumor focality: Unifocal. Dominant tumor: Maximum tumor size (cm): 3.0 cm. Tumor laterality: Right thyroid. Histologic type (including subtype and/or unique features as applicable): Follicular variant of papillary thyroid carcinoma. Tumor capsule: Not identified. Extrathyroidal extension: No. Margins: Negative. Lymph - Vascular invasion: Not identified. Capsular invasion with degree of invasion if present: N/A. Lymph nodes: # examined N/A; # positive; N/A. TNM code: pT2, pNX  - 09/25/2014: completion thyroidectomy  - As she was breast-feeding, we delayed RAI tx (she had 2 small boys at the time), but had this with Tg 09/16/2015 >> WBS 09/24/2015 >> no metastases.  - 12/11/2019: Neck U/S: Isthmus: Surgically absent. There is no residual nodular soft tissue within the isthmic resection bed. Right lobe: Surgically absent. There is no residual nodular soft tissue within the right lobectomy resection bed. Left lobe: Surgically absent. There is no residual nodular soft tissue within the left lobectomy resection bed. No regional cervical lymphadenopathy. IMPRESSION: Post total thyroidectomy without evidence of residual or locally recurrent disease.  Thyroglobulin levels have been low: Lab Results  Component Value Date   THYROGLB 0.2 (L) 12/01/2019   THYROGLB 0.1 (L) 11/28/2018   THYROGLB 0.1 (L) 11/26/2017   THYROGLB 0.1 (L) 12/26/2016   THYROGLB 0.1 (L) 12/16/2015   THGAB <1 12/01/2019   THGAB <1 11/28/2018   THGAB <1 11/26/2017   THGAB <1 12/26/2016  THGAB <1 12/16/2015    Reviewed her TFTs: Lab Results  Component Value Date   TSH 1.47 12/01/2019   TSH 1.33 11/28/2018   TSH 0.92 02/22/2018   TSH 0.11 (L) 01/09/2018   TSH 0.05 (L) 11/26/2017   TSH 2.70 10/03/2017   TSH 2.30 08/24/2017   TSH 3.92 07/26/2017   TSH 6.53 (H) 06/28/2017   TSH 2.67 05/22/2017   TSH 0.87 03/23/2017   TSH 0.56 02/23/2017   TSH 0.32 (L) 12/26/2016   TSH 0.42 08/29/2016   TSH 0.32 (L) 06/16/2016   TSH 0.38 12/16/2015   TSH 0.64 05/25/2015   TSH 3.03 03/26/2015   TSH 1.98 02/19/2015   TSH 7.25 (H) 01/05/2015   FREET4 0.91 12/01/2019   FREET4 0.97 11/28/2018   FREET4 0.94 02/22/2018   FREET4 1.27 01/09/2018   FREET4 1.51 11/26/2017   FREET4 0.82 10/03/2017   FREET4 0.75 08/24/2017   FREET4 0.92 07/26/2017   FREET4 0.82 06/28/2017   FREET4 0.83 05/22/2017   FREET4 0.92 03/23/2017   FREET4 1.15 02/23/2017   FREET4 1.07 12/26/2016   FREET4 0.96 08/29/2016   FREET4 1.38 06/16/2016   FREET4 1.28 12/16/2015   FREET4 1.17 05/25/2015   FREET4 1.17 03/26/2015   FREET4 1.17 02/19/2015   FREET4 1.06 01/05/2015   07/28/2014: TSH 0.916   She is currently on Synthroid 137 mcg daily. - in am (5 am) - fasting - at least 30 min from b'fast - no Ca, Fe, PPIs - + MVI 4h later - not on Biotin  Pt denies: - feeling nodules in neck - hoarseness - dysphagia - choking - SOB with lying down  No FH of thyroid cancer. No h/o radiation tx to head or neck.  No herbal supplements. No Biotin use. No recent steroids use.   She has a history of asthma.  In 11/02/2017 she gave birth to a healthy baby boy.  ROS: Constitutional: + weight gain/no weight loss, no fatigue, no subjective hyperthermia, no subjective hypothermia Eyes: no blurry vision, no xerophthalmia ENT: no sore throat, + see HPI Cardiovascular: no CP/no SOB/no palpitations/no leg swelling Respiratory: no cough/no SOB/no wheezing Gastrointestinal: no N/no V/no D/no C/no acid reflux Musculoskeletal: no muscle  aches/no joint aches Skin: no rashes, no hair loss Neurological: no tremors/no numbness/no tingling/no dizziness  I reviewed pt's medications, allergies, PMH, social hx, family hx, and changes were documented in the history of present illness. Otherwise, unchanged from my initial visit note.  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 post-op   Cancer [facility])    thyroid   Fibroid    Headache 04/01/2015   Traction headache   Hypothyroidism    PONV (postoperative nausea and vomiting)    vomiting   Thyroid neoplasm    Umbilical hernia    Past Surgical History:  Procedure Laterality Date   CESAREAN SECTION N/A 06/29/2014   Procedure: CESAREAN SECTION;  Surgeon: Crawford Givens, MD;  Location: [facility];  Service: Obstetrics;  Laterality: N/A;   DILATION AND CURETTAGE OF UTERUS  03/19/2009   suction D & C, polypectomy   HERNIA REPAIR     SKIN TAG REMOVAL  1996   right ear   SUBOCCIPITAL CRANIECTOMY CERVICAL LAMINECTOMY N/A 05/22/2016   Procedure: CHIARI DECOMPRESSION CERVICAL ONE-TWO DECOMPRESSION LAMINECTOMY;  Surgeon: Kary Kos, MD;  Location: [place];  Service: Neurosurgery;  Laterality: N/A;   THYROID LOBECTOMY Right 09/10/2014   Procedure: RIGHT THYROID LOBECTOMY;  Surgeon: Armandina Gemma, MD;  Location: [facility];  Service: General;  Laterality: Right;   THYROIDECTOMY N/A 09/25/2014   Procedure: COMPLETION THYROIDECTOMY ;  Surgeon: Armandina Gemma, MD;  Location: [facility];  Service: General;  Laterality: N/A;   UMBILICAL HERNIA REPAIR  05/29/2011   Procedure: HERNIA REPAIR UMBILICAL ADULT;  Surgeon: Belva Crome, MD;  Location: [place];  Service: General;  Laterality: N/A;  Umbilical Hernia repair with mesh patch   VACUUM ASSISTED VAGINAL DELIVERY  10/06/2010   repair of second degree midline lac.   WISDOM TOOTH  EXTRACTION     History   Social History   Marital Status: Married    Spouse Name: N/A   Number of Children: 2   Occupational History    school counselor    Social History Main Topics   Smoking status: Never Smoker    Smokeless tobacco: Never Used   Alcohol Use: No   Drug Use: No   Sexual Activity: Yes   Current Outpatient Medications  Medication Sig Dispense Refill   Albuterol Sulfate 108 (90 BASE) MCG/ACT AEPB Inhale 2 puffs into the lungs every 6 (six) hours as needed (shortness of breath).      Fluticasone-Salmeterol (ADVAIR) 100-50 MCG/DOSE AEPB Inhale 1 puff into the lungs 2 (two) times daily.      SYNTHROID 137 MCG tablet Take 1 tablet (137 mcg total) by mouth daily before breakfast. 90 tablet 3   No current facility-administered medications for this visit.   No current facility-administered medications on file prior to visit.    Allergies  Allergen Reactions   Mometasone Furoate Swelling    Only lips and nose.   Montelukast Sodium Nausea And Vomiting   Family History  Problem Relation Age of Onset   Hypertension Mother    Bell's palsy Mother    Thyroid disease Mother    Hypertension Father    Diabetes Father    PE: BP 122/88 (BP Location: Right Arm, Patient Position: Sitting, Cuff Size: Normal)   Pulse 69   Ht 5' 3.5" (1.613 m)   Wt 156 lb (70.8 kg)   SpO2 97%   BMI 27.20 kg/m  Body mass index is 27.2 kg/m.  Wt Readings from Last 3 Encounters:  11/26/20 156 lb (70.8 kg)  12/01/19 155 lb (70.3 kg)  11/28/18 147 lb (66.7 kg)   Constitutional: Normal weight, in NAD Eyes: PERRLA, EOMI, no exophthalmos ENT: moist mucous membranes, no neck masses palpated, no cervical lymphadenopathy Cardiovascular: RRR, No MRG Respiratory: CTA B Gastrointestinal: abdomen soft, NT, ND, BS+ Musculoskeletal: no deformities, strength intact in all 4 Skin: moist, warm, no rashes Neurological: + mild tremor with outstretched hands, DTR normal in all 4  ASSESSMENT: 1.  Follicular PTC  2. Postsurgical hypothyroidism  PLAN: 1. Follicular variant of PTC -Patient has a history of total thyroidectomy for follicular variant of papillary thyroid cancer and had RAI remnant ablation 08/2015.  Posttreatment whole-body scan was negative for metastasis. -She is at low risk for complications from her thyroid cancer due to the fact that she had follicular variant of PTC which is the most benign of the PTC types and since this was stage I due to age -However, her tumor was not encapsulated and was large (3 cm) so I suggested RAI treatment, which we did with Thyrogen. -  No neck masses palpated and no neck compression symptoms -Her thyroglobulin levels were low, but not completely undetectable and at last check, a year ago, Tg was slightly higher, at 0.2.  We checked a neck ultrasound (11/2019) which was unremarkable, without any suspicious masses. -We will repeat her thyroglobulin and ATA now. -We discussed that if this is abnormal, she may need a repeat ultrasound and a whole-body scan. -I will see her back in 1 year  2. Postop hypothyroidism - latest thyroid labs reviewed with pt. >> normal: Lab Results  Component Value Date   TSH 1.47 12/01/2019  - she continues on LT4 137 mcg daily - pt feels good on this dose. - we discussed about taking the thyroid hormone every day, with water, >30 minutes before breakfast, separated by >4 hours from acid reflux medications, calcium, iron, multivitamins. Pt. is taking it correctly. - will check thyroid tests today: TSH and fT4 - If labs are abnormal, she will need to return for repeat TFTs in 1.5 months  Needs refills.  Component     Latest Ref Rng & Units 11/26/2020  Thyroglobulin     ng/mL 0.1 (L)  Thyroglobulin Ab     < or = 1 IU/mL <1  TSH     0.35 - 4.50 uIU/mL 0.28 (L)  T4,Free(Direct)     0.60 - 1.60 ng/dL 1.22  Tg is 0.1 again, with undetectable Tg antibodies.  TSH is slightly suppressed but due to the the prev.  normal TFTs on this dose >> would prefer to recheck her TFTs before suggesting a change in dose.  Philemon Kingdom, MD PhD [facility] Endocrinology

## 2020-11-29 ENCOUNTER — Encounter: Payer: Self-pay | Admitting: Internal Medicine

## 2020-11-29 LAB — THYROGLOBULIN LEVEL: Thyroglobulin: 0.1 ng/mL — ABNORMAL LOW

## 2020-11-29 LAB — THYROGLOBULIN ANTIBODY: Thyroglobulin Ab: <1 IU/mL (ref <=1)

## 2020-11-29 MED ORDER — SYNTHROID 137 MCG PO TABS: 137.0000 ug | ORAL_TABLET | Freq: Every day | ORAL | 3 refills | Status: DC

## 2020-12-03 ENCOUNTER — Ambulatory Visit: Payer: BC Managed Care – PPO | Admitting: Internal Medicine

## 2020-12-19 ENCOUNTER — Other Ambulatory Visit: Payer: Self-pay | Admitting: Internal Medicine

## 2021-01-14 ENCOUNTER — Encounter: Payer: Self-pay | Admitting: Internal Medicine

## 2021-01-28 ENCOUNTER — Other Ambulatory Visit: Payer: BC Managed Care – PPO

## 2021-03-10 ENCOUNTER — Other Ambulatory Visit: Payer: BC Managed Care – PPO

## 2021-03-10 ENCOUNTER — Other Ambulatory Visit (INDEPENDENT_AMBULATORY_CARE_PROVIDER_SITE_OTHER): Payer: BC Managed Care – PPO

## 2021-03-10 DIAGNOSIS — E890 Postprocedural hypothyroidism: Secondary | ICD-10-CM

## 2021-03-10 LAB — TSH: TSH: 1.05 u[IU]/mL (ref 0.35–5.50)

## 2021-03-10 LAB — T4, FREE: Free T4: 0.94 ng/dL (ref 0.60–1.60)

## 2021-03-21 ENCOUNTER — Other Ambulatory Visit: Payer: Self-pay | Admitting: Internal Medicine

## 2021-03-23 ENCOUNTER — Other Ambulatory Visit: Payer: Self-pay

## 2021-03-23 ENCOUNTER — Encounter: Payer: Self-pay | Admitting: Internal Medicine

## 2021-03-23 MED ORDER — LEVOTHYROXINE SODIUM 137 MCG PO TABS: 137.0000 ug | ORAL_TABLET | Freq: Every day | ORAL | 1 refills | Status: DC

## 2021-03-23 NOTE — Telephone Encounter (Signed)
Script sent  

## 2021-11-15 ENCOUNTER — Ambulatory Visit: Payer: BC Managed Care – PPO | Admitting: Internal Medicine

## 2021-11-15 ENCOUNTER — Encounter: Payer: Self-pay | Admitting: Internal Medicine

## 2021-11-15 VITALS — BP 132/88 | HR 82 | Ht 63.5 in | Wt 161.0 lb

## 2021-11-15 DIAGNOSIS — C73 Malignant neoplasm of thyroid gland: Secondary | ICD-10-CM | POA: Diagnosis not present

## 2021-11-15 DIAGNOSIS — E890 Postprocedural hypothyroidism: Secondary | ICD-10-CM

## 2021-11-15 LAB — TSH: TSH: 0.21 u[IU]/mL — ABNORMAL LOW (ref 0.35–5.50)

## 2021-11-15 LAB — T4, FREE: Free T4: 1.02 ng/dL (ref 0.60–1.60)

## 2021-11-15 NOTE — Patient Instructions (Addendum)
Please stop at the lab.  Continue Synthroid 137 mcg 6/7 + 1/2 tablet 1/7. Please let me know when I can send Synthroid 125 mcg to your pharmacy.  Take the thyroid hormone every day, with water, at least 30 minutes before breakfast, separated by at least 4 hours from: - acid reflux medications - calcium - iron - multivitamins  Please come back for a follow-up appointment 1 year.

## 2021-11-15 NOTE — Progress Notes (Unsigned)
Patient ID: Morgan Fleming, female   DOB: 23-Feb-1981, 41 y.o.   MRN: [id number]  This visit occurred during the SARS-CoV-2 public health emergency.  Safety protocols were in place, including screening questions prior to the visit, additional usage of staff PPE, and extensive cleaning of exam room while observing appropriate contact time as indicated for disinfecting solutions.   HPI  Morgan Fleming is a 41 y.o. female, initially referred by her ObGyn, Dr. Ena Dawley, returning for f/u for h/o FV of PTC and postsurgical hypothyroidism. Last visit 1 year ago.  Interim history: At this visit, she has no complaints. She wakes up at 4 AM to exercise.  She gained 5 pounds since last visit.  Reviewed and addended her thyroid cancer history: Thyroid cancer history: At the beginning of 06/2014 she noticed a thyroid nodule in the mirror and saw her OB/GYN doctor who ordered a thyroid ultrasound.  - 07/14/2014: Thyroid U/S: Right thyroid lobe : 56 x 26 x 35 mm. Dominant 32 x 22 x 35 mm nodule, inferior pole. 4 x 3 x 5 mm hypoechoic nodule, superior pole.  Left thyroid lobe: 45 x 14 x 13 mm. Three small cysts, all less than 5 mm maximum diameter.  Isthmus Thickness: 5 mm.  No nodules visualized. Lymphadenopathy: None visualized.  - 08/12/2014: FNA of the right thyroid nodule Adequacy Reason Satisfactory For Evaluation. Diagnosis THYROID, RIGHT, FINE NEEDLE ASPIRATION (SPECIMEN 1 OF 1, COLLECTED ON 08/12/2014): ATYPIA OF UNDETERMINED SIGNIFICANCE / FOLLICULAR LESION OF UNDETERMINED SIGNIFICANCE (BETHESDA CATEGORY III). SEE COMMENT. Willeen Niece MD Pathologist, Electronic Signature (Case signed 08/13/2014) Specimen Clinical Information Dominant 32 x 22 x 26m nodule, inferior pole right lobe thyroid Source Thyroid, Fine Needle Aspiration, Right, (Specimen 1 of 1, collected on 08/12/2014)  - 09/10/2014: R thyroidectomy >> follicular variant of papillary thyroid cancer Thyroid,  lobectomy, right - FOLLICULAR VARIANT OF PAPILLARY THYROID CARCINOMA, 3.0 CM, LIMITED TO THYROID PARENCHYMA. - RESECTION MARGIN, NEGATIVE FOR ATYPIA OR MALIGNANCY. PLEASE SEE ONCOLOGY TEMPLATE FOR DETAILS. - TWO PARATHYROID GLANDS, NO EVIDENCE OF NEOPLASM. Specimen: Right thyroid gland. Procedure: Right hemi-thyroidectomy. Specimen Integrity (intact/fragmented): Intact. Tumor focality: Unifocal. Dominant tumor: Maximum tumor size (cm): 3.0 cm. Tumor laterality: Right thyroid. Histologic type (including subtype and/or unique features as applicable): Follicular variant of papillary thyroid carcinoma. Tumor capsule: Not identified. Extrathyroidal extension: No. Margins: Negative. Lymph - Vascular invasion: Not identified. Capsular invasion with degree of invasion if present: N/A. Lymph nodes: # examined N/A; # positive; N/A. TNM code: pT2, pNX  - 09/25/2014: completion thyroidectomy  - As she was breast-feeding, we delayed RAI tx (she had 2 small boys at the time), but had this with Tg 09/16/2015 >> WBS 09/24/2015 >> no metastases.  - 12/11/2019: Neck U/S: Isthmus: Surgically absent. There is no residual nodular soft tissue within the isthmic resection bed. Right lobe: Surgically absent. There is no residual nodular soft tissue within the right lobectomy resection bed. Left lobe: Surgically absent. There is no residual nodular soft tissue within the left lobectomy resection bed. No regional cervical lymphadenopathy. IMPRESSION: Post total thyroidectomy without evidence of residual or locally recurrent disease.  Thyroglobulin levels have been low: Lab Results  Component Value Date   THYROGLB 0.1 (L) 11/26/2020   THYROGLB 0.2 (L) 12/01/2019   THYROGLB 0.1 (L) 11/28/2018   THYROGLB 0.1 (L) 11/26/2017   THYROGLB 0.1 (L) 12/26/2016   THYROGLB 0.1 (L) 12/16/2015   THGAB <1 11/26/2020   THGAB <1 12/01/2019   THGAB <1 11/28/2018  THGAB <1 11/26/2017   THGAB <1 12/26/2016   THGAB  <1 12/16/2015   Reviewed her TFTs: Lab Results  Component Value Date   TSH 1.05 03/10/2021   TSH 0.28 (L) 11/26/2020   TSH 1.47 12/01/2019   TSH 1.33 11/28/2018   TSH 0.92 02/22/2018   TSH 0.11 (L) 01/09/2018   TSH 0.05 (L) 11/26/2017   TSH 2.70 10/03/2017   TSH 2.30 08/24/2017   TSH 3.92 07/26/2017   TSH 6.53 (H) 06/28/2017   TSH 2.67 05/22/2017   TSH 0.87 03/23/2017   TSH 0.56 02/23/2017   TSH 0.32 (L) 12/26/2016   TSH 0.42 08/29/2016   TSH 0.32 (L) 06/16/2016   TSH 0.38 12/16/2015   TSH 0.64 05/25/2015   TSH 3.03 03/26/2015   FREET4 0.94 03/10/2021   FREET4 1.22 11/26/2020   FREET4 0.91 12/01/2019   FREET4 0.97 11/28/2018   FREET4 0.94 02/22/2018   FREET4 1.27 01/09/2018   FREET4 1.51 11/26/2017   FREET4 0.82 10/03/2017   FREET4 0.75 08/24/2017   FREET4 0.92 07/26/2017   FREET4 0.82 06/28/2017   FREET4 0.83 05/22/2017   FREET4 0.92 03/23/2017   FREET4 1.15 02/23/2017   FREET4 1.07 12/26/2016   FREET4 0.96 08/29/2016   FREET4 1.38 06/16/2016   FREET4 1.28 12/16/2015   FREET4 1.17 05/25/2015   FREET4 1.17 03/26/2015   07/28/2014: TSH 0.916   She is currently on Synthroid 137 mcg daily 6/7 and 68.5 mcg 1/7 (equiv. Of 127 mcg daily) - in am (4 am) - fasting - at least 30 min from b'fast - no Ca, Fe, PPIs - + MVI 4h later - not on Biotin  Pt denies: - feeling nodules in neck - hoarseness - dysphagia - choking - SOB with lying down  No FH of thyroid cancer. No h/o radiation tx to head or neck. No herbal supplements. No Biotin use. No recent steroids use.   She has a history of asthma.  In 11/02/2017 she gave birth to a healthy baby boy.  ROS: + see HPI  I reviewed pt's medications, allergies, PMH, social hx, family hx, and changes were documented in the history of present illness. Otherwise, unchanged from my initial visit note.  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 post-op   Cancer [facility])    thyroid   Fibroid    Headache 04/01/2015   Traction headache   Hypothyroidism    PONV (postoperative nausea and vomiting)    vomiting   Thyroid neoplasm    Umbilical hernia    Past Surgical History:  Procedure Laterality Date   CESAREAN SECTION N/A 06/29/2014   Procedure: CESAREAN SECTION;  Surgeon: Crawford Givens, MD;  Location: [facility];  Service: Obstetrics;  Laterality: N/A;   DILATION AND CURETTAGE OF UTERUS  03/19/2009   suction D & C, polypectomy   HERNIA REPAIR     SKIN TAG REMOVAL  1996   right ear   SUBOCCIPITAL CRANIECTOMY CERVICAL LAMINECTOMY N/A 05/22/2016   Procedure: CHIARI DECOMPRESSION CERVICAL ONE-TWO DECOMPRESSION LAMINECTOMY;  Surgeon: Kary Kos, MD;  Location: [place];  Service: Neurosurgery;  Laterality: N/A;   THYROID LOBECTOMY Right 09/10/2014   Procedure: RIGHT THYROID LOBECTOMY;  Surgeon: Armandina Gemma, MD;  Location[facility];  Service: General;  Laterality: Right;  THYROIDECTOMY N/A 09/25/2014   Procedure: COMPLETION THYROIDECTOMY ;  Surgeon: Armandina Gemma, MD;  Location[facility];  Service: General;  Laterality: N/A;   UMBILICAL HERNIA REPAIR  05/29/2011   Procedure: HERNIA REPAIR UMBILICAL ADULT;  Surgeon: Belva Crome, MD;  Location: [place];  Service: General;  Laterality: N/A;  Umbilical Hernia repair with mesh patch   VACUUM ASSISTED VAGINAL DELIVERY  10/06/2010   repair of second degree midline lac.   WISDOM TOOTH EXTRACTION     History   Social History   Marital Status: Married    Spouse Name: N/A   Number of Children: 2   Occupational History    school counselor    Social History Main Topics   Smoking status: Never Smoker    Smokeless tobacco: Never Used   Alcohol Use: No   Drug Use: No   Sexual Activity: Yes   Current Outpatient Medications  Medication Sig Dispense Refill   Albuterol Sulfate 108 (90  BASE) MCG/ACT AEPB Inhale 2 puffs into the lungs every 6 (six) hours as needed (shortness of breath).      cetirizine (ZYRTEC) 10 MG tablet Take 10 mg by mouth daily.     Fluticasone-Salmeterol (ADVAIR) 100-50 MCG/DOSE AEPB Inhale 1 puff into the lungs 2 (two) times daily.      levothyroxine (SYNTHROID) 137 MCG tablet Take 1 tablet (137 mcg total) by mouth daily before breakfast. 90 tablet 1   No current facility-administered medications for this visit.   No current facility-administered medications on file prior to visit.    Allergies  Allergen Reactions   Mometasone Furoate Swelling    Only lips and nose.   Montelukast Sodium Nausea And Vomiting   Family History  Problem Relation Age of Onset   Hypertension Mother    Bell's palsy Mother    Thyroid disease Mother    Hypertension Father    Diabetes Father    PE: BP 132/88 (BP Location: Left Arm, Patient Position: Sitting, Cuff Size: Normal)   Pulse 82   Ht 5' 3.5" (1.613 m)   Wt 161 lb (73 kg)   SpO2 99%   BMI 28.07 kg/m  Body mass index is 28.07 kg/m.  Wt Readings from Last 3 Encounters:  11/15/21 161 lb (73 kg)  11/26/20 156 lb (70.8 kg)  12/01/19 155 lb (70.3 kg)   Constitutional: Normal weight, in NAD Eyes: PERRLA, EOMI, no exophthalmos ENT: moist mucous membranes, no neck masses palpated, no cervical lymphadenopathy Cardiovascular: RRR, No MRG Respiratory: CTA B Musculoskeletal: no deformities Skin: moist, warm, no rashes Neurological: + mild tremor with outstretched hands  ASSESSMENT: 1. Follicular PTC  2. Postsurgical hypothyroidism  PLAN: 1. Follicular variant of PTC -Patient has a history of total thyroidectomy for follicular variant of papillary thyroid cancer and had RAI remnant ablation 08/2015.  Posttreatment whole-body scan was negative for metastases. -She is at low risk for complications from her thyroid cancer due to the fact that she had follicular variant of PTC which is the most benign of the  PTC types and since this was stage I, due to her age -However, her tumor was not encapsulated and was large (3 cm) so I suggested RAI treatment, which we did with Thyrogen -No neck masses palpated and no neck compression symptoms -Her thyroglobulin levels were very low, but not completely undetectable, and 0.2 and then 0.1 at last visit.   -We checked a neck ultrasound (11/2019): Unremarkable, without any suspicious masses. -At today's  visit we will repeat a thyroglobulin and ATA.  If increasing, will need a neck ultrasound and possibly, afterwards, a Thyrogen stimulated whole-body scan -I will see her back in 1 year  2. Postop hypothyroidism - latest thyroid labs reviewed with pt. >> normal: Lab Results  Component Value Date   TSH 1.05 03/10/2021  - she continues on Synthroid d.a.w., the equivalent of 127 mcg daily - pt feels good on this dose. - we discussed about taking the thyroid hormone every day, with water, >30 minutes before breakfast, separated by >4 hours from acid reflux medications, calcium, iron, multivitamins. Pt. is taking it correctly. - will check thyroid tests today: TSH and fT4.  We discussed that if the tests are at goal, to continue the current dose of Synthroid.  She just refilled the 137 mcg dose so for now we will continue with her current regimen, taking the full dose 6 out of 7 days and half a dose 1 out of 7 days.  However, I asked her to send me a message before she needs a refill so we can send the Synthroid 125 mcg daily tablets. - If labs are abnormal, she will need to return for repeat TFTs in 1.5 months  Philemon Kingdom, MD PhD [facility] Endocrinology

## 2021-11-17 LAB — THYROGLOBULIN ANTIBODY: Thyroglobulin Ab: <1 IU/mL (ref <=1)

## 2021-11-17 LAB — THYROGLOBULIN LEVEL: Thyroglobulin: 0.1 ng/mL — ABNORMAL LOW

## 2021-12-02 ENCOUNTER — Ambulatory Visit: Payer: BC Managed Care – PPO | Admitting: Internal Medicine

## 2021-12-22 ENCOUNTER — Encounter: Payer: Self-pay | Admitting: Internal Medicine

## 2021-12-22 DIAGNOSIS — E890 Postprocedural hypothyroidism: Secondary | ICD-10-CM

## 2021-12-23 MED ORDER — LEVOTHYROXINE SODIUM 125 MCG PO TABS: 125.0000 ug | ORAL_TABLET | Freq: Every day | ORAL | 3 refills | Status: DC

## 2022-07-07 ENCOUNTER — Other Ambulatory Visit: Payer: Self-pay | Admitting: Internal Medicine

## 2022-07-07 DIAGNOSIS — E890 Postprocedural hypothyroidism: Secondary | ICD-10-CM

## 2022-11-16 ENCOUNTER — Encounter: Payer: Self-pay | Admitting: Internal Medicine

## 2022-11-23 ENCOUNTER — Ambulatory Visit: Payer: BC Managed Care – PPO | Admitting: Internal Medicine

## 2022-12-04 ENCOUNTER — Ambulatory Visit (INDEPENDENT_AMBULATORY_CARE_PROVIDER_SITE_OTHER): Payer: BC Managed Care – PPO | Admitting: Internal Medicine

## 2022-12-04 ENCOUNTER — Encounter: Payer: Self-pay | Admitting: Internal Medicine

## 2022-12-04 VITALS — BP 118/70 | HR 77 | Ht 63.5 in | Wt 160.2 lb

## 2022-12-04 DIAGNOSIS — E890 Postprocedural hypothyroidism: Secondary | ICD-10-CM

## 2022-12-04 DIAGNOSIS — C73 Malignant neoplasm of thyroid gland: Secondary | ICD-10-CM | POA: Diagnosis not present

## 2022-12-04 LAB — TSH: TSH: 5 u[IU]/mL (ref 0.35–5.50)

## 2022-12-04 LAB — T4, FREE: Free T4: 0.93 ng/dL (ref 0.60–1.60)

## 2022-12-04 NOTE — Patient Instructions (Signed)
Please stop at the lab.  Continue Synthroid 125 mcg daily.  Take the thyroid hormone every day, with water, at least 30 minutes before breakfast, separated by at least 4 hours from: - acid reflux medications - calcium - iron - multivitamins  Please come back for a follow-up appointment 1 year.

## 2022-12-04 NOTE — Progress Notes (Signed)
Patient ID: Morgan Fleming, female   DOB: 29-Jul-1980, 42 y.o.   MRN: [id number]  HPI  NAKEETA Fleming is a 42 y.o. female, initially referred by her ObGyn, Dr. Kirkland Hun, returning for f/u for h/o FV of PTC and postsurgical hypothyroidism. Last visit 1 year ago.  Interim history: No complaints at today's visit. No problems swallowing, choking, neck pressure.  Reviewed her thyroid cancer history: Thyroid cancer history: At the beginning of 06/2014 she noticed a thyroid nodule in the mirror and saw her OB/GYN doctor who ordered a thyroid ultrasound.  - 07/14/2014: Thyroid U/S: Right thyroid lobe : 56 x 26 x 35 mm. Dominant 32 x 22 x 35 mm nodule, inferior pole. 4 x 3 x 5 mm hypoechoic nodule, superior pole.  Left thyroid lobe: 45 x 14 x 13 mm. Three small cysts, all less than 5 mm maximum diameter.  Isthmus Thickness: 5 mm.  No nodules visualized. Lymphadenopathy: None visualized.  - 08/12/2014: FNA of the right thyroid nodule Adequacy Reason Satisfactory For Evaluation. Diagnosis THYROID, RIGHT, FINE NEEDLE ASPIRATION (SPECIMEN 1 OF 1, COLLECTED ON 08/12/2014): ATYPIA OF UNDETERMINED SIGNIFICANCE / FOLLICULAR LESION OF UNDETERMINED SIGNIFICANCE (BETHESDA CATEGORY III). SEE COMMENT. Zandra Abts MD Pathologist, Electronic Signature (Case signed 08/13/2014) Specimen Clinical Information Dominant 32 x 22 x 35mm nodule, inferior pole right lobe thyroid Source Thyroid, Fine Needle Aspiration, Right, (Specimen 1 of 1, collected on 08/12/2014)  - 09/10/2014: R thyroidectomy >> follicular variant of papillary thyroid cancer Thyroid, lobectomy, right - FOLLICULAR VARIANT OF PAPILLARY THYROID CARCINOMA, 3.0 CM, LIMITED TO THYROID PARENCHYMA. - RESECTION MARGIN, NEGATIVE FOR ATYPIA OR MALIGNANCY. PLEASE SEE ONCOLOGY TEMPLATE FOR DETAILS. - TWO PARATHYROID GLANDS, NO EVIDENCE OF NEOPLASM. Specimen: Right thyroid gland. Procedure: Right hemi-thyroidectomy. Specimen Integrity  (intact/fragmented): Intact. Tumor focality: Unifocal. Dominant tumor: Maximum tumor size (cm): 3.0 cm. Tumor laterality: Right thyroid. Histologic type (including subtype and/or unique features as applicable): Follicular variant of papillary thyroid carcinoma. Tumor capsule: Not identified. Extrathyroidal extension: No. Margins: Negative. Lymph - Vascular invasion: Not identified. Capsular invasion with degree of invasion if present: N/A. Lymph nodes: # examined N/A; # positive; N/A. TNM code: pT2, pNX  - 09/25/2014: completion thyroidectomy  - As she was breast-feeding, we delayed RAI tx (she had 2 small boys at the time), but had this with Tg 09/16/2015 >> WBS 09/24/2015 >> no metastases.  - 12/11/2019: Neck U/S: Isthmus: Surgically absent. There is no residual nodular soft tissue within the isthmic resection bed. Right lobe: Surgically absent. There is no residual nodular soft tissue within the right lobectomy resection bed. Left lobe: Surgically absent. There is no residual nodular soft tissue within the left lobectomy resection bed. No regional cervical lymphadenopathy. IMPRESSION: Post total thyroidectomy without evidence of residual or locally recurrent disease.  Thyroglobulin levels have been low: Lab Results  Component Value Date   THYROGLB 0.1 (L) 11/15/2021   THYROGLB 0.1 (L) 11/26/2020   THYROGLB 0.2 (L) 12/01/2019   THYROGLB 0.1 (L) 11/28/2018   THYROGLB 0.1 (L) 11/26/2017   THYROGLB 0.1 (L) 12/26/2016   THYROGLB 0.1 (L) 12/16/2015   THGAB <1 11/15/2021   THGAB <1 11/26/2020   THGAB <1 12/01/2019   THGAB <1 11/28/2018   THGAB <1 11/26/2017   THGAB <1 12/26/2016   THGAB <1 12/16/2015   Reviewed her TFTs: Lab Results  Component Value Date   TSH 0.21 (L) 11/15/2021   TSH 1.05 03/10/2021   TSH 0.28 (L) 11/26/2020   TSH 1.47 12/01/2019  TSH 1.33 11/28/2018   TSH 0.92 02/22/2018   TSH 0.11 (L) 01/09/2018   TSH 0.05 (L) 11/26/2017   TSH 2.70 10/03/2017    TSH 2.30 08/24/2017   TSH 3.92 07/26/2017   TSH 6.53 (H) 06/28/2017   TSH 2.67 05/22/2017   TSH 0.87 03/23/2017   TSH 0.56 02/23/2017   TSH 0.32 (L) 12/26/2016   TSH 0.42 08/29/2016   TSH 0.32 (L) 06/16/2016   TSH 0.38 12/16/2015   TSH 0.64 05/25/2015   FREET4 1.02 11/15/2021   FREET4 0.94 03/10/2021   FREET4 1.22 11/26/2020   FREET4 0.91 12/01/2019   FREET4 0.97 11/28/2018   FREET4 0.94 02/22/2018   FREET4 1.27 01/09/2018   FREET4 1.51 11/26/2017   FREET4 0.82 10/03/2017   FREET4 0.75 08/24/2017   FREET4 0.92 07/26/2017   FREET4 0.82 06/28/2017   FREET4 0.83 05/22/2017   FREET4 0.92 03/23/2017   FREET4 1.15 02/23/2017   FREET4 1.07 12/26/2016   FREET4 0.96 08/29/2016   FREET4 1.38 06/16/2016   FREET4 1.28 12/16/2015   FREET4 1.17 05/25/2015   07/28/2014: TSH 0.916   She is currently on Synthroid 125 mcg daily, decreased at last OV: - in am (4 am) - fasting - at least 30 min from b'fast - no Ca, Fe, PPIs - + MVI 4h later - not on Biotin  No FH of thyroid cancer. No h/o radiation tx to head or neck. No herbal supplements. No Biotin use. No recent steroids use.   She has a history of asthma.  ROS: + see HPI  I reviewed pt's medications, allergies, PMH, social hx, family hx, and changes were documented in the history of present illness. Otherwise, unchanged from my initial visit note.  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 post-op   Cancer [facility])    thyroid   Fibroid    Headache 04/01/2015   Traction headache   Hypothyroidism    PONV (postoperative nausea and vomiting)    vomiting   Thyroid neoplasm    Umbilical hernia    Past Surgical History:  Procedure Laterality Date   CESAREAN SECTION N/A 06/29/2014   Procedure: CESAREAN SECTION;  Surgeon: Jaymes Graff, MD;  Location: [facility]  ORS;  Service: Obstetrics;  Laterality: N/A;   DILATION AND CURETTAGE OF UTERUS  03/19/2009   suction D & C, polypectomy   HERNIA REPAIR     SKIN TAG REMOVAL  1996   right ear   SUBOCCIPITAL CRANIECTOMY CERVICAL LAMINECTOMY N/A 05/22/2016   Procedure: CHIARI DECOMPRESSION CERVICAL ONE-TWO DECOMPRESSION LAMINECTOMY;  Surgeon: Donalee Citrin, MD;  Location: [facility] OR;  Service: Neurosurgery;  Laterality: N/A;   THYROID LOBECTOMY Right 09/10/2014   Procedure: RIGHT THYROID LOBECTOMY;  Surgeon: Darnell Level, MD;  Location: [facility] ORS;  Service: General;  Laterality: Right;   THYROIDECTOMY N/A 09/25/2014   Procedure: COMPLETION THYROIDECTOMY ;  Surgeon: Darnell Level, MD;  Location: [facility]S;  Service: General;  Laterality: N/A;   UMBILICAL HERNIA REPAIR  05/29/2011   Procedure: HERNIA REPAIR UMBILICAL ADULT;  Surgeon: Jetty Duhamel, MD;  Location: [facility];  Service: General;  Laterality: N/A;  Umbilical Hernia repair with mesh patch   VACUUM ASSISTED VAGINAL DELIVERY  10/06/2010   repair of second degree midline lac.   WISDOM  TOOTH EXTRACTION     History   Social History   Marital Status: Married    Spouse Name: N/A   Number of Children: 2   Occupational History    school counselor    Social History Main Topics   Smoking status: Never Smoker    Smokeless tobacco: Never Used   Alcohol Use: No   Drug Use: No   Sexual Activity: Yes   Current Outpatient Medications  Medication Sig Dispense Refill   Albuterol Sulfate 108 (90 BASE) MCG/ACT AEPB Inhale 2 puffs into the lungs every 6 (six) hours as needed (shortness of breath).      cetirizine (ZYRTEC) 10 MG tablet Take 10 mg by mouth daily.     Fluticasone-Salmeterol (ADVAIR) 100-50 MCG/DOSE AEPB Inhale 1 puff into the lungs 2 (two) times daily.      SYNTHROID 125 MCG tablet TAKE 1 TABLET BY MOUTH DAILY BEFORE BREAKFAST. 90 tablet 1   No current facility-administered medications for this visit.   No current facility-administered  medications on file prior to visit.    Allergies  Allergen Reactions   Mometasone Furoate Swelling    Only lips and nose.   Montelukast Sodium Nausea And Vomiting   Family History  Problem Relation Age of Onset   Hypertension Mother    Bell's palsy Mother    Thyroid disease Mother    Hypertension Father    Diabetes Father    PE: BP 118/70   Pulse 77   Ht 5' 3.5" (1.613 m)   Wt 160 lb 3.2 oz (72.7 kg)   SpO2 99%   BMI 27.93 kg/m  Body mass index is 27.93 kg/m.  Wt Readings from Last 3 Encounters:  12/04/22 160 lb 3.2 oz (72.7 kg)  11/15/21 161 lb (73 kg)  11/26/20 156 lb (70.8 kg)   Constitutional: Normal weight, in NAD Eyes: EOMI, no exophthalmos ENT: no neck masses palpated, no cervical lymphadenopathy Cardiovascular: RRR, No MRG Respiratory: CTA B Musculoskeletal: no deformities Skin: no rashes Neurological: + mild tremor with outstretched hands  ASSESSMENT: 1. Follicular PTC  2. Postsurgical hypothyroidism  PLAN: 1. Follicular variant of PTC -Patient has a history of total thyroidectomy for follicular variant of papillary thyroid cancer and had RAI remnant ablation 08/2015.  Posttreatment whole-body scan was negative for metastases. -She is at low risk for complications from her thyroid cancer due to the fact that she had follicular variant of PTC which is the most benign of the PTC types and since this was stage I, due to her age -However, her tumor was not encapsulated and was large (3 cm) I did suggest RAI treatment, which we did with Thyrogen rather than hormone replacement -No neck masses palpated and no neck compression symptoms -Her thyroglobulin levels were very low, but not completely undetectable, 0.1 at last visit -Thyroid ultrasound obtained in 11/2019 was unremarkable, without any suspicious masses -At today's visit, we will repeat her thyroglobulin and ATA.   -At today's visit I suggested to check a neck ultrasound and if the thyroglobulin is  increasing, she may also need a Thyrogen stimulated whole-body scan -I will see her back in 1 year  2. Postop hypothyroidism - latest thyroid labs reviewed with pt. >> TSH was slightly low: Lab Results  Component Value Date   TSH 0.21 (L) 11/15/2021  - she continues on LT4 125 mcg daily, decreased after the above results returned, but she did not return for labs afterwards... - pt feels good on this dose. -  we discussed about taking the thyroid hormone every day, with water, >30 minutes before breakfast, separated by >4 hours from acid reflux medications, calcium, iron, multivitamins. Pt. is taking it correctly. - will check thyroid tests today: TSH and fT4 - If labs are abnormal, she will need to return for repeat TFTs in 1.5 months - OTW, I will see her back in a year  Needs refills.  Component     Latest Ref Rng 12/04/2022  TSH     0.35 - 5.50 uIU/mL 5.00   T4,Free(Direct)     0.60 - 1.60 ng/dL 9.62   Thyroglobulin Ab     < or = 1 IU/mL <1   Thyroglobulin     ng/mL 0.2 (L)   Thyroglobulin is slightly higher than before, possibly also related to the higher TSH.  Will increase the dose of levothyroxine to 137 mcg daily and recheck TFTs in 2 months.  Carlus Pavlov, MD PhD [facility] Endocrinology

## 2022-12-05 LAB — THYROGLOBULIN LEVEL: Thyroglobulin: 0.2 ng/mL — ABNORMAL LOW

## 2022-12-05 LAB — THYROGLOBULIN ANTIBODY: Thyroglobulin Ab: <1 IU/mL (ref <=1)

## 2022-12-06 MED ORDER — SYNTHROID 137 MCG PO TABS: 137.0000 ug | ORAL_TABLET | Freq: Every day | ORAL | 3 refills | Status: DC

## 2022-12-19 ENCOUNTER — Ambulatory Visit
Admission: RE | Admit: 2022-12-19 | Discharge: 2022-12-19 | Disposition: A | Discharge status: Home / Self Care | Payer: BC Managed Care – PPO | Source: Ambulatory Visit | Attending: Internal Medicine | Admitting: Internal Medicine

## 2022-12-19 DIAGNOSIS — C73 Malignant neoplasm of thyroid gland: Secondary | ICD-10-CM

## 2023-01-02 ENCOUNTER — Other Ambulatory Visit: Payer: Self-pay | Admitting: Internal Medicine

## 2023-01-02 DIAGNOSIS — E890 Postprocedural hypothyroidism: Secondary | ICD-10-CM

## 2023-02-09 ENCOUNTER — Other Ambulatory Visit (INDEPENDENT_AMBULATORY_CARE_PROVIDER_SITE_OTHER): Payer: BC Managed Care – PPO

## 2023-02-09 DIAGNOSIS — C73 Malignant neoplasm of thyroid gland: Secondary | ICD-10-CM

## 2023-02-09 DIAGNOSIS — E890 Postprocedural hypothyroidism: Secondary | ICD-10-CM | POA: Diagnosis not present

## 2023-02-09 LAB — T4, FREE: Free T4: 1.04 ng/dL (ref 0.60–1.60)

## 2023-02-09 LAB — TSH: TSH: 0.37 u[IU]/mL (ref 0.35–5.50)

## 2023-02-12 LAB — THYROGLOBULIN ANTIBODY: Thyroglobulin Ab: <1 [IU]/mL (ref <=1)

## 2023-02-12 LAB — THYROGLOBULIN LEVEL: Thyroglobulin: 0.2 ng/mL — ABNORMAL LOW

## 2023-07-30 ENCOUNTER — Other Ambulatory Visit: Payer: Self-pay | Admitting: Internal Medicine

## 2023-07-31 NOTE — Telephone Encounter (Signed)
Levothyroxine refill request complete

## 2023-12-03 ENCOUNTER — Ambulatory Visit: Payer: BC Managed Care – PPO | Admitting: Internal Medicine

## 2023-12-03 ENCOUNTER — Encounter: Payer: Self-pay | Admitting: Internal Medicine

## 2023-12-03 VITALS — BP 118/70 | HR 69 | Ht 63.5 in | Wt 166.0 lb

## 2023-12-03 DIAGNOSIS — C73 Malignant neoplasm of thyroid gland: Secondary | ICD-10-CM | POA: Diagnosis not present

## 2023-12-03 DIAGNOSIS — E890 Postprocedural hypothyroidism: Secondary | ICD-10-CM | POA: Diagnosis not present

## 2023-12-03 NOTE — Progress Notes (Addendum)
 Patient ID: Morgan Fleming, female   DOB: 10-17-1980, 43 y.o.   MRN: [id number]  HPI  Morgan Fleming is a 43 y.o. female, initially referred by her ObGyn, Dr. Lula Sale, returning for f/u for history of follicular variant of papillary thyroid  cancer and postsurgical hypothyroidism. Last visit 1 year ago.  Interim history: No problems swallowing, choking, neck pressure. She has some hot flushes at night.   Reviewed her thyroid  cancer history: Thyroid  cancer history: At the beginning of 06/2014 she noticed a thyroid  nodule in the mirror and saw her OB/GYN doctor who ordered a thyroid  ultrasound.  - 07/14/2014: Thyroid  U/S: Right thyroid  lobe : 56 x 26 x 35 mm. Dominant 32 x 22 x 35 mm nodule, inferior pole. 4 x 3 x 5 mm hypoechoic nodule, superior pole.  Left thyroid  lobe: 45 x 14 x 13 mm. Three small cysts, all less than 5 mm maximum diameter.  Isthmus Thickness: 5 mm.  No nodules visualized. Lymphadenopathy: None visualized.  - 08/12/2014: FNA of the right thyroid  nodule Adequacy Reason Satisfactory For Evaluation. Diagnosis THYROID , RIGHT, FINE NEEDLE ASPIRATION (SPECIMEN 1 OF 1, COLLECTED ON 08/12/2014): ATYPIA OF UNDETERMINED SIGNIFICANCE / FOLLICULAR LESION OF UNDETERMINED SIGNIFICANCE (BETHESDA CATEGORY III). SEE COMMENT. Lavaughn Portland MD Pathologist, Electronic Signature (Case signed 08/13/2014) Specimen Clinical Information Dominant 32 x 22 x 35mm nodule, inferior pole right lobe thyroid  Source Thyroid , Fine Needle Aspiration, Right, (Specimen 1 of 1, collected on 08/12/2014)  - 09/10/2014: R thyroidectomy >> follicular variant of papillary thyroid  cancer Thyroid , lobectomy, right - FOLLICULAR VARIANT OF PAPILLARY THYROID  CARCINOMA, 3.0 CM, LIMITED TO THYROID  PARENCHYMA. - RESECTION MARGIN, NEGATIVE FOR ATYPIA OR MALIGNANCY. PLEASE SEE ONCOLOGY TEMPLATE FOR DETAILS. - TWO PARATHYROID GLANDS, NO EVIDENCE OF NEOPLASM. Specimen: Right thyroid   gland. Procedure: Right hemi-thyroidectomy. Specimen Integrity (intact/fragmented): Intact. Tumor focality: Unifocal. Dominant tumor: Maximum tumor size (cm): 3.0 cm. Tumor laterality: Right thyroid . Histologic type (including subtype and/or unique features as applicable): Follicular variant of papillary thyroid  carcinoma. Tumor capsule: Not identified. Extrathyroidal extension: No. Margins: Negative. Lymph - Vascular invasion: Not identified. Capsular invasion with degree of invasion if present: N/A. Lymph nodes: # examined N/A; # positive; N/A. TNM code: pT2, pNX  - 09/25/2014: completion thyroidectomy  - As she was breast-feeding, we delayed RAI tx (she had 2 small boys at the time), but had this with Tg 09/16/2015 >> WBS 09/24/2015 >> no metastases.  - 12/11/2019: Neck U/S: Isthmus: Surgically absent. There is no residual nodular soft tissue within the isthmic resection bed. Right lobe: Surgically absent. There is no residual nodular soft tissue within the right lobectomy resection bed. Left lobe: Surgically absent. There is no residual nodular soft tissue within the left lobectomy resection bed. No regional cervical lymphadenopathy. IMPRESSION: Post total thyroidectomy without evidence of residual or locally recurrent disease.  - 12/19/2022: Neck U/S: No residual thyroid  tissue is identified in the thyroidectomy bed. No enlarged lymph nodes seen in the imaged portions of the neck. IMPRESSION: No evidence of recurrent or residual thyroid  malignancy.  Thyroglobulin levels have been low but not undetectable: Lab Results  Component Value Date   THYROGLB 0.2 (L) 02/09/2023   THYROGLB 0.2 (L) 12/04/2022   THYROGLB 0.1 (L) 11/15/2021   THYROGLB 0.1 (L) 11/26/2020   THYROGLB 0.2 (L) 12/01/2019   THYROGLB 0.1 (L) 11/28/2018   THYROGLB 0.1 (L) 11/26/2017   THYROGLB 0.1 (L) 12/26/2016   THYROGLB 0.1 (L) 12/16/2015   THGAB <1 02/09/2023   THGAB <1 12/04/2022  THGAB <1  11/15/2021   THGAB <1 11/26/2020   THGAB <1 12/01/2019   THGAB <1 11/28/2018   THGAB <1 11/26/2017   THGAB <1 12/26/2016   THGAB <1 12/16/2015   Reviewed her TFTs: Lab Results  Component Value Date   TSH 0.37 02/09/2023   TSH 5.00 12/04/2022   TSH 0.21 (L) 11/15/2021   TSH 1.05 03/10/2021   TSH 0.28 (L) 11/26/2020   TSH 1.47 12/01/2019   TSH 1.33 11/28/2018   TSH 0.92 02/22/2018   TSH 0.11 (L) 01/09/2018   TSH 0.05 (L) 11/26/2017   TSH 2.70 10/03/2017   TSH 2.30 08/24/2017   TSH 3.92 07/26/2017   TSH 6.53 (H) 06/28/2017   TSH 2.67 05/22/2017   TSH 0.87 03/23/2017   TSH 0.56 02/23/2017   TSH 0.32 (L) 12/26/2016   TSH 0.42 08/29/2016   TSH 0.32 (L) 06/16/2016   FREET4 1.04 02/09/2023   FREET4 0.93 12/04/2022   FREET4 1.02 11/15/2021   FREET4 0.94 03/10/2021   FREET4 1.22 11/26/2020   FREET4 0.91 12/01/2019   FREET4 0.97 11/28/2018   FREET4 0.94 02/22/2018   FREET4 1.27 01/09/2018   FREET4 1.51 11/26/2017   FREET4 0.82 10/03/2017   FREET4 0.75 08/24/2017   FREET4 0.92 07/26/2017   FREET4 0.82 06/28/2017   FREET4 0.83 05/22/2017   FREET4 0.92 03/23/2017   FREET4 1.15 02/23/2017   FREET4 1.07 12/26/2016   FREET4 0.96 08/29/2016   FREET4 1.38 06/16/2016   07/28/2014: TSH 0.916   She is currently on Synthroid  137 mcg daily, increased at last OV: - in am (4:30 am) - fasting - at least 30 min from b'fast - no Ca, Fe, PPIs - + MVI 4h later (evening) - not on Biotin  No FH of thyroid  cancer. No h/o radiation tx to head or neck. No herbal supplements. No Biotin use. No recent steroids use.   She has a history of asthma.  ROS: + see HPI  I reviewed pt's medications, allergies, PMH, social hx, family hx, and changes were documented in the history of present illness. Otherwise, unchanged from my initial visit note.  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 post-op   Cancer (HCC)    thyroid    Fibroid    Headache 04/01/2015   Traction headache   Hypothyroidism    PONV (postoperative nausea and vomiting)    vomiting   Thyroid  neoplasm    Umbilical hernia    Past Surgical History:  Procedure Laterality Date   CESAREAN SECTION N/A 06/29/2014   Procedure: CESAREAN SECTION;  Surgeon: Marylu Soda, MD;  Location: [facility];  Service: Obstetrics;  Laterality: N/A;   DILATION AND CURETTAGE OF UTERUS  03/19/2009   suction D & C, polypectomy   HERNIA REPAIR     SKIN TAG REMOVAL  1996   right ear   SUBOCCIPITAL CRANIECTOMY CERVICAL LAMINECTOMY N/A 05/22/2016   Procedure: CHIARI DECOMPRESSION CERVICAL ONE-TWO DECOMPRESSION LAMINECTOMY;  Surgeon: Gearl Keens, MD;  [facility];  Service: Neurosurgery;  Laterality: N/A;   THYROID  LOBECTOMY Right 09/10/2014   Procedure: RIGHT THYROID  LOBECTOMY;  Surgeon: Oralee Billow, MD;  Location: [facility];  Service: General;  Laterality: Right;   THYROIDECTOMY N/A 09/25/2014   Procedure: COMPLETION THYROIDECTOMY ;  Surgeon: Oralee Billow, MD;  Location: [facility];  Service: General;  Laterality: N/A;   UMBILICAL HERNIA REPAIR  05/29/2011   Procedure: HERNIA REPAIR UMBILICAL ADULT;  Surgeon: Sherril Dole, MD;  Location: [facility];  Service: General;  Laterality: N/A;  Umbilical Hernia repair with mesh patch   VACUUM ASSISTED VAGINAL DELIVERY  10/06/2010   repair of second degree midline lac.   WISDOM TOOTH EXTRACTION     History   Social History   Marital Status: Married    Spouse Name: N/A   Number of Children: 2   Occupational History    school counselor    Social History Main Topics   Smoking status: Never Smoker    Smokeless tobacco: Never Used   Alcohol Use: No   Drug Use: No   Sexual Activity: Yes   Current Outpatient Medications  Medication Sig Dispense Refill   Albuterol  Sulfate 108 (90 BASE) MCG/ACT AEPB Inhale 2  puffs into the lungs every 6 (six) hours as needed (shortness of breath).      cetirizine (ZYRTEC) 10 MG tablet Take 10 mg by mouth daily.     Fluticasone -Salmeterol (ADVAIR) 100-50 MCG/DOSE AEPB Inhale 1 puff into the lungs 2 (two) times daily.      SYNTHROID  125 MCG tablet TAKE 1 TABLET BY MOUTH EVERY DAY BEFORE BREAKFAST 90 tablet 1   SYNTHROID  137 MCG tablet TAKE 1 TABLET BY MOUTH EVERY DAY BEFORE BREAKFAST 60 tablet 3   No current facility-administered medications for this visit.   No current facility-administered medications on file prior to visit.    Allergies  Allergen Reactions   Mometasone  Furoate Swelling    Only lips and nose.   Montelukast Sodium Nausea And Vomiting   Family History  Problem Relation Age of Onset   Hypertension Mother    Bell's palsy Mother    Thyroid  disease Mother    Hypertension Father    Diabetes Father    PE: BP 118/70   Pulse 69   Ht 5' 3.5 (1.613 m)   Wt 166 lb (75.3 kg)   SpO2 98%   BMI 28.94 kg/m  Body mass index is 28.94 kg/m.  Wt Readings from Last 3 Encounters:  12/03/23 166 lb (75.3 kg)  12/04/22 160 lb 3.2 oz (72.7 kg)  11/15/21 161 lb (73 kg)   Constitutional: Normal weight, in NAD Eyes: EOMI, no exophthalmos ENT: no neck masses palpated, no cervical lymphadenopathy Cardiovascular: RRR, No MRG Respiratory: CTA B Musculoskeletal: no deformities Skin: no rashes Neurological: no tremor with outstretched hands  ASSESSMENT: 1. Follicular PTC  2. Postsurgical hypothyroidism  PLAN: 1. Follicular variant of PTC -Patient has a history of total thyroidectomy for follicular variant of papillary thyroid  cancer and had RAI remnant ablation 08/2015.  Posttreatment whole-body scan was negative for metastases. -She is at low risk for complications from her thyroid  cancer since this was stage I, due to her age and the lack of concerning features on pathology -However, her tumor was not encapsulated and was large (3 cm) I did  suggest RAI treatment, which we did with Thyrogen  rather than hormone replacement -No neck masses palpated or neck compression symptoms -Her thyroglobulin levels were low but not completely undetectable.  At last visit, thyroglobulin was 0.2 and this was repeated afterwards. -We will recheck her thyroglobulin and ATA today -At last visit, we checked a thyroid  ultrasound (12/19/2022).  This did not show any signs of thyroid  cancer recurrence.  Previous thyroid  ultrasound 11/2019 was also unremarkable. -Will  recheck her thyroglobulin today.  If still detectable, above 0.1, I will suggest a Thyrogen  stimulated whole-body scan -I will see her back in 1 year  2. Postop hypothyroidism - latest thyroid  labs reviewed with pt. >> normal: Lab Results  Component Value Date   TSH 0.37 02/09/2023  - she continues on LT4 137 mcg daily, increased at last visit - pt feels good on this dose but she does mention occasional nocturnal hot flashes.  We discussed that this could be related to thyrotoxicosis and we may need to adjust the dose down if the TSH is suppressed. - we discussed about taking the thyroid  hormone every day, with water, >30 minutes before breakfast, separated by >4 hours from acid reflux medications, calcium , iron, multivitamins. Pt. is taking it correctly. - will check thyroid  tests today: TSH and fT4, apparently TSH is around the lower limit of the target range - If labs are abnormal, she will need to return for repeat TFTs in 1.5 months - OTW, I will see her back in a year  Needs refills.  Component     Latest Ref Rng 12/03/2023  Thyroglobulin     ng/mL 0.1 (L)   Comment -   Thyroglobulin Ab     < or = 1 IU/mL <1   TSH     mIU/L 0.44   T4,Free(Direct)     0.8 - 1.8 ng/dL 1.4   Thyroglobulin has improved to barely detectable, 0.1.  The rest of the tests are excellent.  Emilie Harden, MD PhD [facility] Endocrinology

## 2023-12-03 NOTE — Addendum Note (Signed)
 Addended by: Emilie Harden on: 12/03/2023 08:36 AM   Modules accepted: Orders

## 2023-12-03 NOTE — Patient Instructions (Signed)
Please stop at the lab.  Continue Synthroid 137 mcg daily.  Take the thyroid hormone every day, with water, at least 30 minutes before breakfast, separated by at least 4 hours from: - acid reflux medications - calcium - iron - multivitamins  Please come back for a follow-up appointment 1 year.

## 2023-12-04 LAB — THYROGLOBULIN ANTIBODY: Thyroglobulin Ab: <1 [IU]/mL (ref <=1)

## 2023-12-04 LAB — T4, FREE: Free T4: 1.4 ng/dL (ref 0.8–1.8)

## 2023-12-04 LAB — THYROGLOBULIN LEVEL: Thyroglobulin: 0.1 ng/mL — ABNORMAL LOW

## 2023-12-04 LAB — TSH: TSH: 0.44 m[IU]/L

## 2023-12-05 ENCOUNTER — Ambulatory Visit: Payer: Self-pay | Admitting: Internal Medicine

## 2023-12-05 MED ORDER — SYNTHROID 137 MCG PO TABS: 137.0000 ug | ORAL_TABLET | Freq: Every day | ORAL | 3 refills | Status: DC

## 2023-12-05 MED ORDER — SYNTHROID 137 MCG PO TABS: 137.0000 ug | ORAL_TABLET | Freq: Every day | ORAL | 3 refills | Status: AC

## 2023-12-05 NOTE — Addendum Note (Signed)
 Addended by: Emilie Harden on: 12/05/2023 09:19 AM   Modules accepted: Orders

## 2024-02-02 ENCOUNTER — Other Ambulatory Visit: Payer: Self-pay | Admitting: Internal Medicine

## 2024-04-01 ENCOUNTER — Other Ambulatory Visit: Payer: Self-pay | Admitting: Obstetrics & Gynecology

## 2024-04-04 LAB — SURGICAL PATHOLOGY

## 2024-12-08 ENCOUNTER — Ambulatory Visit: Admitting: Internal Medicine
